# Patient Record
Sex: Female | Born: 1945
Health system: Southern US, Community
[De-identification: ages and names within clinical notes are randomized; demographics above are authoritative.]

## PROBLEM LIST (undated history)

## (undated) DIAGNOSIS — G43909 Migraine, unspecified, not intractable, without status migrainosus: Secondary | ICD-10-CM

## (undated) DIAGNOSIS — D219 Benign neoplasm of connective and other soft tissue, unspecified: Secondary | ICD-10-CM

## (undated) DIAGNOSIS — M8580 Other specified disorders of bone density and structure, unspecified site: Secondary | ICD-10-CM

## (undated) DIAGNOSIS — E039 Hypothyroidism, unspecified: Secondary | ICD-10-CM

## (undated) DIAGNOSIS — M1990 Unspecified osteoarthritis, unspecified site: Secondary | ICD-10-CM

## (undated) DIAGNOSIS — E785 Hyperlipidemia, unspecified: Secondary | ICD-10-CM

## (undated) DIAGNOSIS — J4520 Mild intermittent asthma, uncomplicated: Secondary | ICD-10-CM

## (undated) DIAGNOSIS — T7840XA Allergy, unspecified, initial encounter: Secondary | ICD-10-CM

## (undated) DIAGNOSIS — R011 Cardiac murmur, unspecified: Secondary | ICD-10-CM

## (undated) DIAGNOSIS — E559 Vitamin D deficiency, unspecified: Secondary | ICD-10-CM

## (undated) DIAGNOSIS — G5700 Lesion of sciatic nerve, unspecified lower limb: Secondary | ICD-10-CM

## (undated) HISTORY — DX: Mild intermittent asthma, uncomplicated: J45.20

## (undated) HISTORY — PX: OTHER SURGICAL HISTORY: SHX169

## (undated) HISTORY — DX: Benign neoplasm of connective and other soft tissue, unspecified: D21.9

## (undated) HISTORY — DX: Migraine, unspecified, not intractable, without status migrainosus: G43.909

## (undated) HISTORY — PX: DENTAL SURGERY: SHX609

## (undated) HISTORY — DX: Hyperlipidemia, unspecified: E78.5

## (undated) HISTORY — DX: Cardiac murmur, unspecified: R01.1

## (undated) HISTORY — DX: Lesion of sciatic nerve, unspecified lower limb: G57.00

## (undated) HISTORY — DX: Vitamin D deficiency, unspecified: E55.9

## (undated) HISTORY — PX: TUBAL LIGATION: SHX77

## (undated) HISTORY — DX: Hypothyroidism, unspecified: E03.9

## (undated) HISTORY — PX: CATARACT EXTRACTION: SUR2

## (undated) HISTORY — PX: POLYPECTOMY: SHX149

## (undated) HISTORY — DX: Allergy, unspecified, initial encounter: T78.40XA

## (undated) HISTORY — PX: SPINE SURGERY: SHX786

## (undated) HISTORY — DX: Unspecified osteoarthritis, unspecified site: M19.90

## (undated) HISTORY — PX: LUMBAR FUSION: SHX111

## (undated) HISTORY — DX: Other specified disorders of bone density and structure, unspecified site: M85.80

## (undated) HISTORY — PX: HYSTEROSCOPY WITH D & C: SHX1775

## (undated) HISTORY — PX: SINOSCOPY: SHX187

## (undated) HISTORY — PX: EYE SURGERY: SHX253

## (undated) HISTORY — PX: SEPTOPLASTY: SUR1290

---

## 1998-06-02 ENCOUNTER — Other Ambulatory Visit: Admission: RE | Admit: 1998-06-02 | Discharge: 1998-06-02 | Payer: Self-pay | Admitting: Obstetrics and Gynecology

## 1999-05-17 ENCOUNTER — Other Ambulatory Visit: Admission: RE | Admit: 1999-05-17 | Discharge: 1999-05-17 | Payer: Self-pay | Admitting: Obstetrics and Gynecology

## 1999-11-15 ENCOUNTER — Encounter: Payer: Self-pay | Admitting: Obstetrics and Gynecology

## 1999-11-15 ENCOUNTER — Encounter: Admission: RE | Admit: 1999-11-15 | Discharge: 1999-11-15 | Payer: Self-pay | Admitting: Obstetrics and Gynecology

## 2000-05-29 ENCOUNTER — Other Ambulatory Visit: Admission: RE | Admit: 2000-05-29 | Discharge: 2000-05-29 | Payer: Self-pay | Admitting: Obstetrics and Gynecology

## 2000-11-15 ENCOUNTER — Encounter: Payer: Self-pay | Admitting: Obstetrics and Gynecology

## 2000-11-15 ENCOUNTER — Encounter: Admission: RE | Admit: 2000-11-15 | Discharge: 2000-11-15 | Payer: Self-pay | Admitting: Obstetrics and Gynecology

## 2000-11-17 ENCOUNTER — Emergency Department (HOSPITAL_COMMUNITY): Admission: EM | Admit: 2000-11-17 | Discharge: 2000-11-17 | Payer: Self-pay | Admitting: Emergency Medicine

## 2000-12-13 ENCOUNTER — Other Ambulatory Visit: Admission: RE | Admit: 2000-12-13 | Discharge: 2000-12-13 | Payer: Self-pay | Admitting: Obstetrics and Gynecology

## 2000-12-13 ENCOUNTER — Encounter (INDEPENDENT_AMBULATORY_CARE_PROVIDER_SITE_OTHER): Payer: Self-pay | Admitting: Specialist

## 2001-05-28 ENCOUNTER — Other Ambulatory Visit: Admission: RE | Admit: 2001-05-28 | Discharge: 2001-05-28 | Payer: Self-pay | Admitting: Obstetrics and Gynecology

## 2001-11-20 ENCOUNTER — Encounter: Admission: RE | Admit: 2001-11-20 | Discharge: 2001-11-20 | Payer: Self-pay | Admitting: Obstetrics and Gynecology

## 2001-11-20 ENCOUNTER — Encounter: Payer: Self-pay | Admitting: Obstetrics and Gynecology

## 2001-11-22 ENCOUNTER — Encounter: Payer: Self-pay | Admitting: Obstetrics and Gynecology

## 2001-11-22 ENCOUNTER — Encounter: Admission: RE | Admit: 2001-11-22 | Discharge: 2001-11-22 | Payer: Self-pay | Admitting: Obstetrics and Gynecology

## 2002-05-10 ENCOUNTER — Encounter (INDEPENDENT_AMBULATORY_CARE_PROVIDER_SITE_OTHER): Payer: Self-pay | Admitting: Specialist

## 2002-05-10 ENCOUNTER — Ambulatory Visit (HOSPITAL_COMMUNITY): Admission: RE | Admit: 2002-05-10 | Discharge: 2002-05-10 | Payer: Self-pay | Admitting: Obstetrics and Gynecology

## 2002-06-10 ENCOUNTER — Other Ambulatory Visit: Admission: RE | Admit: 2002-06-10 | Discharge: 2002-06-10 | Payer: Self-pay | Admitting: Obstetrics and Gynecology

## 2002-11-26 ENCOUNTER — Encounter: Admission: RE | Admit: 2002-11-26 | Discharge: 2002-11-26 | Payer: Self-pay | Admitting: Obstetrics and Gynecology

## 2002-11-26 ENCOUNTER — Encounter: Payer: Self-pay | Admitting: Obstetrics and Gynecology

## 2003-07-18 ENCOUNTER — Other Ambulatory Visit: Admission: RE | Admit: 2003-07-18 | Discharge: 2003-07-18 | Payer: Self-pay | Admitting: Obstetrics and Gynecology

## 2003-12-24 ENCOUNTER — Encounter: Admission: RE | Admit: 2003-12-24 | Discharge: 2003-12-24 | Payer: Self-pay | Admitting: Obstetrics and Gynecology

## 2004-01-01 ENCOUNTER — Encounter: Admission: RE | Admit: 2004-01-01 | Discharge: 2004-01-01 | Payer: Self-pay | Admitting: Internal Medicine

## 2004-05-14 ENCOUNTER — Ambulatory Visit: Payer: Self-pay | Admitting: Internal Medicine

## 2004-07-20 ENCOUNTER — Other Ambulatory Visit: Admission: RE | Admit: 2004-07-20 | Discharge: 2004-07-20 | Payer: Self-pay | Admitting: *Deleted

## 2004-12-21 ENCOUNTER — Ambulatory Visit: Payer: Self-pay | Admitting: Internal Medicine

## 2004-12-24 ENCOUNTER — Encounter: Admission: RE | Admit: 2004-12-24 | Discharge: 2004-12-24 | Payer: Self-pay | Admitting: Obstetrics and Gynecology

## 2005-03-03 ENCOUNTER — Ambulatory Visit: Payer: Self-pay | Admitting: Internal Medicine

## 2005-04-22 ENCOUNTER — Ambulatory Visit: Payer: Self-pay | Admitting: Internal Medicine

## 2005-07-22 ENCOUNTER — Other Ambulatory Visit: Admission: RE | Admit: 2005-07-22 | Discharge: 2005-07-22 | Payer: Self-pay | Admitting: Obstetrics & Gynecology

## 2005-08-24 ENCOUNTER — Ambulatory Visit: Payer: Self-pay | Admitting: Internal Medicine

## 2005-12-19 ENCOUNTER — Ambulatory Visit: Payer: Self-pay | Admitting: Internal Medicine

## 2005-12-26 ENCOUNTER — Encounter: Admission: RE | Admit: 2005-12-26 | Discharge: 2005-12-26 | Payer: Self-pay | Admitting: Internal Medicine

## 2006-01-03 ENCOUNTER — Ambulatory Visit: Payer: Self-pay | Admitting: Internal Medicine

## 2006-03-30 ENCOUNTER — Ambulatory Visit: Payer: Self-pay | Admitting: Internal Medicine

## 2006-05-18 ENCOUNTER — Ambulatory Visit: Payer: Self-pay | Admitting: Internal Medicine

## 2006-06-14 ENCOUNTER — Ambulatory Visit: Payer: Self-pay | Admitting: Internal Medicine

## 2006-06-30 ENCOUNTER — Ambulatory Visit: Payer: Self-pay | Admitting: Internal Medicine

## 2006-09-28 DIAGNOSIS — M8580 Other specified disorders of bone density and structure, unspecified site: Secondary | ICD-10-CM | POA: Insufficient documentation

## 2006-09-28 DIAGNOSIS — M810 Age-related osteoporosis without current pathological fracture: Secondary | ICD-10-CM | POA: Insufficient documentation

## 2006-09-28 DIAGNOSIS — M545 Low back pain: Secondary | ICD-10-CM | POA: Insufficient documentation

## 2006-09-28 DIAGNOSIS — K589 Irritable bowel syndrome without diarrhea: Secondary | ICD-10-CM | POA: Insufficient documentation

## 2006-10-06 ENCOUNTER — Other Ambulatory Visit: Admission: RE | Admit: 2006-10-06 | Discharge: 2006-10-06 | Payer: Self-pay | Admitting: Obstetrics & Gynecology

## 2006-12-28 ENCOUNTER — Encounter: Payer: Self-pay | Admitting: Internal Medicine

## 2006-12-28 ENCOUNTER — Encounter: Admission: RE | Admit: 2006-12-28 | Discharge: 2006-12-28 | Payer: Self-pay | Admitting: Internal Medicine

## 2007-01-22 ENCOUNTER — Ambulatory Visit: Payer: Self-pay | Admitting: Internal Medicine

## 2007-01-22 DIAGNOSIS — N318 Other neuromuscular dysfunction of bladder: Secondary | ICD-10-CM

## 2007-01-22 DIAGNOSIS — E039 Hypothyroidism, unspecified: Secondary | ICD-10-CM

## 2007-01-22 DIAGNOSIS — E785 Hyperlipidemia, unspecified: Secondary | ICD-10-CM

## 2007-01-24 ENCOUNTER — Encounter: Payer: Self-pay | Admitting: Internal Medicine

## 2007-01-26 ENCOUNTER — Encounter (INDEPENDENT_AMBULATORY_CARE_PROVIDER_SITE_OTHER): Payer: Self-pay | Admitting: *Deleted

## 2007-01-26 LAB — CONVERTED CEMR LAB
ALT: 17 units/L (ref 0–35)
Albumin: 4.2 g/dL (ref 3.5–5.2)
Basophils Relative: 0.4 % (ref 0.0–1.0)
Bilirubin, Direct: 0.1 mg/dL (ref 0.0–0.3)
CO2: 32 meq/L (ref 19–32)
Calcium: 8.9 mg/dL (ref 8.4–10.5)
Creatinine,U: 31.5 mg/dL
Eosinophils Absolute: 0.2 10*3/uL (ref 0.0–0.6)
GFR calc non Af Amer: 78 mL/min
HDL: 56.6 mg/dL (ref >39.0)
LDL Cholesterol: 106 mg/dL — ABNORMAL HIGH (ref 0–99)
MCHC: 33.8 g/dL (ref 30.0–36.0)
Microalb, Ur: 0.2 mg/dL (ref 0.0–1.9)
Monocytes Absolute: 0.5 10*3/uL (ref 0.2–0.7)
Monocytes Relative: 5.9 % (ref 3.0–11.0)
Neutrophils Relative %: 68.2 % (ref 43.0–77.0)
Platelets: 435 10*3/uL — ABNORMAL HIGH (ref 150–400)
Potassium: 3.8 meq/L (ref 3.5–5.1)
RDW: 13.4 % (ref 11.5–14.6)
Sodium: 139 meq/L (ref 135–145)
Triglycerides: 114 mg/dL (ref 0–149)
VLDL: 23 mg/dL (ref 0–40)

## 2007-01-31 ENCOUNTER — Encounter: Admission: RE | Admit: 2007-01-31 | Discharge: 2007-01-31 | Payer: Self-pay | Admitting: Internal Medicine

## 2007-01-31 ENCOUNTER — Encounter: Payer: Self-pay | Admitting: Internal Medicine

## 2007-02-27 ENCOUNTER — Encounter: Payer: Self-pay | Admitting: Internal Medicine

## 2007-04-18 ENCOUNTER — Ambulatory Visit: Payer: Self-pay | Admitting: Internal Medicine

## 2007-05-15 ENCOUNTER — Telehealth: Payer: Self-pay | Admitting: Internal Medicine

## 2007-06-11 ENCOUNTER — Telehealth (INDEPENDENT_AMBULATORY_CARE_PROVIDER_SITE_OTHER): Payer: Self-pay | Admitting: *Deleted

## 2007-06-14 ENCOUNTER — Encounter: Payer: Self-pay | Admitting: Internal Medicine

## 2007-09-04 ENCOUNTER — Ambulatory Visit: Payer: Self-pay | Admitting: Internal Medicine

## 2007-09-24 ENCOUNTER — Ambulatory Visit: Payer: Self-pay | Admitting: Internal Medicine

## 2007-12-05 ENCOUNTER — Ambulatory Visit: Payer: Self-pay | Admitting: Internal Medicine

## 2007-12-05 DIAGNOSIS — M1990 Unspecified osteoarthritis, unspecified site: Secondary | ICD-10-CM | POA: Insufficient documentation

## 2007-12-05 DIAGNOSIS — R351 Nocturia: Secondary | ICD-10-CM

## 2007-12-10 ENCOUNTER — Other Ambulatory Visit: Admission: RE | Admit: 2007-12-10 | Discharge: 2007-12-10 | Payer: Self-pay | Admitting: Obstetrics & Gynecology

## 2007-12-17 LAB — CONVERTED CEMR LAB
Albumin: 3.9 g/dL (ref 3.5–5.2)
BUN: 9 mg/dL (ref 6–23)
Basophils Relative: 0.1 % (ref 0.0–1.0)
Calcium: 8.7 mg/dL (ref 8.4–10.5)
Cholesterol: 235 mg/dL (ref 0–200)
Eosinophils Absolute: 0.3 10*3/uL (ref 0.0–0.7)
Eosinophils Relative: 3.9 % (ref 0.0–5.0)
GFR calc non Af Amer: 67 mL/min
HDL: 46.6 mg/dL (ref >39.0)
Lymphocytes Relative: 27.9 % (ref 12.0–46.0)
Monocytes Absolute: 0.5 10*3/uL (ref 0.1–1.0)
Neutro Abs: 4.4 10*3/uL (ref 1.4–7.7)
Neutrophils Relative %: 61.6 % (ref 43.0–77.0)
Platelets: 399 10*3/uL (ref 150–400)
RBC: 4.33 M/uL (ref 3.87–5.11)
Sodium: 135 meq/L (ref 135–145)
VLDL: 24 mg/dL (ref 0–40)

## 2007-12-31 ENCOUNTER — Encounter: Admission: RE | Admit: 2007-12-31 | Discharge: 2007-12-31 | Payer: Self-pay | Admitting: Obstetrics & Gynecology

## 2008-01-14 ENCOUNTER — Ambulatory Visit: Payer: Self-pay | Admitting: Internal Medicine

## 2008-01-14 ENCOUNTER — Encounter (INDEPENDENT_AMBULATORY_CARE_PROVIDER_SITE_OTHER): Payer: Self-pay | Admitting: *Deleted

## 2008-01-14 LAB — CONVERTED CEMR LAB: OCCULT 3: NEGATIVE

## 2008-01-15 ENCOUNTER — Telehealth (INDEPENDENT_AMBULATORY_CARE_PROVIDER_SITE_OTHER): Payer: Self-pay | Admitting: *Deleted

## 2008-02-20 ENCOUNTER — Telehealth (INDEPENDENT_AMBULATORY_CARE_PROVIDER_SITE_OTHER): Payer: Self-pay | Admitting: *Deleted

## 2008-02-22 ENCOUNTER — Ambulatory Visit: Payer: Self-pay | Admitting: Internal Medicine

## 2008-02-26 ENCOUNTER — Telehealth (INDEPENDENT_AMBULATORY_CARE_PROVIDER_SITE_OTHER): Payer: Self-pay | Admitting: *Deleted

## 2008-02-27 LAB — CONVERTED CEMR LAB
Cholesterol: 176 mg/dL (ref 0–200)
HDL: 31.6 mg/dL — ABNORMAL LOW (ref >39.0)
TSH: 9.18 microintl units/mL — ABNORMAL HIGH (ref 0.35–5.50)
Triglycerides: 157 mg/dL — ABNORMAL HIGH (ref 0–149)
VLDL: 31 mg/dL (ref 0–40)

## 2008-04-23 ENCOUNTER — Ambulatory Visit: Payer: Self-pay | Admitting: Internal Medicine

## 2008-04-28 ENCOUNTER — Encounter (INDEPENDENT_AMBULATORY_CARE_PROVIDER_SITE_OTHER): Payer: Self-pay | Admitting: *Deleted

## 2008-04-28 LAB — CONVERTED CEMR LAB: TSH: 1.41 microintl units/mL (ref 0.35–5.50)

## 2008-07-03 ENCOUNTER — Other Ambulatory Visit: Admission: RE | Admit: 2008-07-03 | Discharge: 2008-07-03 | Payer: Self-pay | Admitting: Obstetrics & Gynecology

## 2008-07-08 ENCOUNTER — Ambulatory Visit: Payer: Self-pay | Admitting: Internal Medicine

## 2008-07-08 DIAGNOSIS — K219 Gastro-esophageal reflux disease without esophagitis: Secondary | ICD-10-CM | POA: Insufficient documentation

## 2008-07-09 ENCOUNTER — Encounter: Payer: Self-pay | Admitting: Internal Medicine

## 2008-07-18 ENCOUNTER — Ambulatory Visit: Payer: Self-pay | Admitting: Internal Medicine

## 2008-07-20 LAB — CONVERTED CEMR LAB
ALT: 11 units/L (ref 0–35)
AST: 19 units/L (ref 0–37)
Albumin: 3.8 g/dL (ref 3.5–5.2)
Alkaline Phosphatase: 56 units/L (ref 39–117)
Bilirubin, Direct: 0.1 mg/dL (ref 0.0–0.3)
Cholesterol: 161 mg/dL (ref 0–200)
HDL: 44.8 mg/dL (ref >39.0)
LDL Cholesterol: 88 mg/dL (ref 0–99)
TSH: 2.41 microintl units/mL (ref 0.35–5.50)
Total Bilirubin: 0.9 mg/dL (ref 0.3–1.2)
Total CHOL/HDL Ratio: 3.6
Total Protein: 7.1 g/dL (ref 6.0–8.3)
Triglycerides: 139 mg/dL (ref 0–149)
VLDL: 28 mg/dL (ref 0–40)

## 2008-07-21 ENCOUNTER — Encounter (INDEPENDENT_AMBULATORY_CARE_PROVIDER_SITE_OTHER): Payer: Self-pay | Admitting: *Deleted

## 2008-08-18 ENCOUNTER — Telehealth: Payer: Self-pay | Admitting: Internal Medicine

## 2008-09-02 ENCOUNTER — Telehealth (INDEPENDENT_AMBULATORY_CARE_PROVIDER_SITE_OTHER): Payer: Self-pay | Admitting: *Deleted

## 2008-12-31 ENCOUNTER — Encounter: Admission: RE | Admit: 2008-12-31 | Discharge: 2008-12-31 | Payer: Self-pay | Admitting: Obstetrics & Gynecology

## 2009-03-11 ENCOUNTER — Telehealth (INDEPENDENT_AMBULATORY_CARE_PROVIDER_SITE_OTHER): Payer: Self-pay | Admitting: *Deleted

## 2009-03-11 ENCOUNTER — Encounter (INDEPENDENT_AMBULATORY_CARE_PROVIDER_SITE_OTHER): Payer: Self-pay | Admitting: *Deleted

## 2009-03-13 ENCOUNTER — Telehealth (INDEPENDENT_AMBULATORY_CARE_PROVIDER_SITE_OTHER): Payer: Self-pay | Admitting: *Deleted

## 2009-03-18 ENCOUNTER — Ambulatory Visit: Payer: Self-pay | Admitting: Internal Medicine

## 2009-03-18 LAB — CONVERTED CEMR LAB: TSH: 0.55 microintl units/mL (ref 0.35–5.50)

## 2009-03-19 ENCOUNTER — Ambulatory Visit: Payer: Self-pay | Admitting: Internal Medicine

## 2009-03-19 ENCOUNTER — Encounter (INDEPENDENT_AMBULATORY_CARE_PROVIDER_SITE_OTHER): Payer: Self-pay | Admitting: *Deleted

## 2009-03-19 DIAGNOSIS — E559 Vitamin D deficiency, unspecified: Secondary | ICD-10-CM

## 2009-03-19 LAB — CONVERTED CEMR LAB: Vit D, 25-Hydroxy: 29 ng/mL — ABNORMAL LOW (ref 30–89)

## 2009-03-26 ENCOUNTER — Encounter: Payer: Self-pay | Admitting: Internal Medicine

## 2009-03-26 ENCOUNTER — Encounter: Admission: RE | Admit: 2009-03-26 | Discharge: 2009-03-26 | Payer: Self-pay | Admitting: Internal Medicine

## 2009-03-30 ENCOUNTER — Encounter (INDEPENDENT_AMBULATORY_CARE_PROVIDER_SITE_OTHER): Payer: Self-pay | Admitting: *Deleted

## 2009-04-08 ENCOUNTER — Ambulatory Visit: Payer: Self-pay | Admitting: Internal Medicine

## 2009-06-25 ENCOUNTER — Ambulatory Visit: Payer: Self-pay | Admitting: Family

## 2009-06-29 ENCOUNTER — Encounter: Payer: Self-pay | Admitting: Internal Medicine

## 2009-07-10 ENCOUNTER — Encounter: Payer: Self-pay | Admitting: Internal Medicine

## 2009-09-17 ENCOUNTER — Encounter: Payer: Self-pay | Admitting: Internal Medicine

## 2009-10-09 ENCOUNTER — Ambulatory Visit: Payer: Self-pay | Admitting: Internal Medicine

## 2009-10-09 LAB — CONVERTED CEMR LAB
ALT: 16 units/L (ref 0–35)
AST: 22 units/L (ref 0–37)
Basophils Absolute: 0.1 10*3/uL (ref 0.0–0.1)
Basophils Relative: 0.8 % (ref 0.0–3.0)
Bilirubin Urine: NEGATIVE
Chloride: 103 meq/L (ref 96–112)
Cholesterol: 188 mg/dL (ref 0–200)
Eosinophils Absolute: 0.2 10*3/uL (ref 0.0–0.7)
Eosinophils Relative: 2.5 % (ref 0.0–5.0)
Hemoglobin: 14.3 g/dL (ref 12.0–15.0)
Ketones, urine, test strip: NEGATIVE
Lymphs Abs: 2.3 10*3/uL (ref 0.7–4.0)
MCHC: 34.2 g/dL (ref 30.0–36.0)
Monocytes Relative: 8.2 % (ref 3.0–12.0)
Neutro Abs: 4.9 10*3/uL (ref 1.4–7.7)
Neutrophils Relative %: 60.6 % (ref 43.0–77.0)
Platelets: 416 10*3/uL — ABNORMAL HIGH (ref 150.0–400.0)
RDW: 13 % (ref 11.5–14.6)
TSH: 0.93 microintl units/mL (ref 0.35–5.50)
Total CHOL/HDL Ratio: 3
Triglycerides: 132 mg/dL (ref 0.0–149.0)

## 2009-10-16 ENCOUNTER — Ambulatory Visit: Payer: Self-pay | Admitting: Internal Medicine

## 2009-10-16 DIAGNOSIS — J309 Allergic rhinitis, unspecified: Secondary | ICD-10-CM | POA: Insufficient documentation

## 2009-10-16 DIAGNOSIS — R209 Unspecified disturbances of skin sensation: Secondary | ICD-10-CM

## 2010-01-01 ENCOUNTER — Encounter: Admission: RE | Admit: 2010-01-01 | Discharge: 2010-01-01 | Payer: Self-pay | Admitting: Obstetrics & Gynecology

## 2010-01-01 LAB — HM MAMMOGRAPHY: HM Mammogram: NORMAL

## 2010-03-26 ENCOUNTER — Ambulatory Visit: Payer: Self-pay | Admitting: Internal Medicine

## 2010-04-05 ENCOUNTER — Ambulatory Visit: Payer: Self-pay | Admitting: Internal Medicine

## 2010-04-05 DIAGNOSIS — G43009 Migraine without aura, not intractable, without status migrainosus: Secondary | ICD-10-CM | POA: Insufficient documentation

## 2010-04-05 DIAGNOSIS — H9209 Otalgia, unspecified ear: Secondary | ICD-10-CM | POA: Insufficient documentation

## 2010-08-03 NOTE — Letter (Signed)
Summary: Letter Regarding Disease Mgmt Program/[facility]  Letter Regarding Disease Mgmt Program/[facility]   Imported By: Lanelle Bal 09/22/2009 13:00:17  _____________________________________________________________________  External Attachment:    Type:   Image     Comment:   External Document

## 2010-08-03 NOTE — Assessment & Plan Note (Signed)
Summary: FLU SHOT///SPH  Nurse Visit   Allergies: No Known Drug Allergies  Orders Added: 1)  Admin 1st Vaccine [90471] 2)  Flu Vaccine 73yrs + [16109] Flu Vaccine Consent Questions     Do you have a history of severe allergic reactions to this vaccine? no    Any prior history of allergic reactions to egg and/or gelatin? no    Do you have a sensitivity to the preservative Thimersol? no    Do you have a past history of Guillan-Barre Syndrome? no    Do you currently have an acute febrile illness? no    Have you ever had a severe reaction to latex? no    Vaccine information given and explained to patient? yes    Are you currently pregnant? no    Lot Number:[id number]   Exp Date:01/01/2011   Site Given  right  Deltoid IM

## 2010-08-03 NOTE — Assessment & Plan Note (Signed)
Summary: sore in mouth/tingling cheek/cbs   Vital Signs:  Patient profile:   65 year old female Weight:      156 pounds BMI:     27.30 Temp:     98.3 degrees F oral Pulse rate:   64 / minute Resp:     15 per minute BP sitting:   128 / 72  (left arm) Cuff size:   large  Vitals Entered By: Shonna Chock CMA (April 05, 2010 4:18 PM) CC: Mouth/ cheek concerns (History of mouth ulcers), Ear pain   CC:  Mouth/ cheek concerns (History of mouth ulcers) and Ear pain.  History of Present Illness:   Intermittent tingling L upper face since 04/02/2010; PMH of similar symptoms Spring 2011 for 1 -2 weeks.Rx: none. She had a headache in temples several days prior to onset of tingling.PMH of migraines , as needed Fioricet from Dr Leda Quail, Gyn.She had been @ the [employer] increased amounts of shellfish   She also  presents with  R ear pain since 09/30.  The patient also reports sensation of fullness, progression of chronic  hearing loss , sinus pressure & congestion, and clear nasal discharge, but denies ear discharge, tinnitus, and fever.  The "sore"  pain is located behind the right ear.  The pain is described as intermittent and worse @ night in [place].  The patient denies headache, toothache, dizziness, and vertigo.  Patient's history includes hay fever/allergies.  Rx: Tylenol Sinus with decreased congestion. She noted R posterior oral ulcers. Rx: gargling with saline & topical throat medication.   Current Medications (verified): 1)  Levothroid 112 Mcg  Tabs (Levothyroxine Sodium) .Marland Kitchen.. 1 By Mouth Once Daily Except 1& 1/2 On Weds 2)  Vivelle 0.05 Mg/24hr Pttw (Estradiol) .... One Patch Two Time Weekly 3)  Multivitamin 4)  Vit C 1000mg  5)  Folic Acid 6)  Pravachol 20 Mg  Tabs (Pravastatin Sodium) .... One Daily 7)  Tums 500 Mg Chew (Calcium Carbonate Antacid) .Marland Kitchen.. 1 By Mouth As Needed 8)  Lorazepam 0.5 Mg Tabs (Lorazepam) .Marland Kitchen.. 1 Q 8-12 Hrs As Needed Stress 9)  Prometrium 200 Mg Caps  (Progesterone Micronized) .Marland Kitchen.. 1 By Mouth Once Daily 10)  Vitamin E 400 Unit Caps (Vitamin E) .Marland Kitchen.. 1 By Mouth Once Daily 11)  Vitamin D3 1000 Unit Caps (Cholecalciferol) .Marland Kitchen.. 1 By Mouth Once Daily 12)  Fioricet 50-325-40 Mg Tabs (Butalbital-Apap-Caffeine) .... As Needed 13)  Temazepam 15 Mg Caps (Temazepam) .... As Needed  Allergies (verified): No Known Drug Allergies  Review of Systems ENT:  Complains of hoarseness; denies difficulty swallowing. Derm:  Denies lesion(s) and rash. Neuro:  Denies brief paralysis, numbness, and weakness.  Physical Exam  General:  in no acute distress; alert,appropriate and cooperative throughout examination Eyes:  No corneal or conjunctival inflammation noted. EOMI. Perrla. FOV ;  Vision  decreased OS due to lens for near vision. Slight ptosis  Ears:  External ear exam shows no significant lesions or deformities.  Otoscopic examination reveals clear canals, tympanic membranes are intact bilaterally without bulging, retraction, inflammation or discharge. Hearing aids  bilaterally.  Nose:  External nasal examination shows no deformity or inflammation. Nasal mucosa are pink and moist without lesions or exudates. Mouth:  Oral mucosa and oropharynx without lesions or exudates.  Teeth in good repair. Minimal erythema oropharynx Heart:  Normal rate and regular rhythm. S1 and S2 normal without gallop, murmur, click, rub .S4 Pulses:  R and L carotid,radial  pulses are full and  equal bilaterally Neurologic:  alert & oriented X3, cranial nerves II-XII intact, strength normal in all extremities, sensation intact to light touch, gait normal, DTRs symmetrical and normal, finger-to-nose normal, and Romberg negative.   Skin:  Intact without suspicious lesions or rashes Cervical Nodes:  No lymphadenopathy noted Axillary Nodes:  No palpable lymphadenopathy Psych:  memory intact for recent and remote, normally interactive, and good eye contact.     Impression &  Recommendations:  Problem # 1:  FACIAL PARESTHESIA, LEFT (ICD-782.0) probably  migraine related; minimal residual ptosis OS  Problem # 2:  EAR PAIN, REFERRED (ICD-388.72) probably related to aphthous ulcers  Problem # 3:  MIGRAINE, COMMON (ICD-346.10) R/O neurologic aura Her updated medication list for this problem includes:    Fioricet 50-325-40 Mg Tabs (Butalbital-apap-caffeine) .Marland Kitchen... As needed  Complete Medication List: 1)  Levothroid 112 Mcg Tabs (Levothyroxine sodium) .Marland Kitchen.. 1 by mouth once daily except 1& 1/2 on weds 2)  Vivelle 0.05 Mg/24hr Pttw (Estradiol) .... One patch two time weekly 3)  Multivitamin  4)  Vit C 1000mg   5)  Folic Acid  6)  Pravachol 20 Mg Tabs (Pravastatin sodium) .... One daily 7)  Tums 500 Mg Chew (Calcium carbonate antacid) .Marland Kitchen.. 1 by mouth as needed 8)  Lorazepam 0.5 Mg Tabs (Lorazepam) .Marland Kitchen.. 1 q 8-12 hrs as needed stress 9)  Prometrium 200 Mg Caps (Progesterone micronized) .Marland Kitchen.. 1 by mouth once daily 10)  Vitamin E 400 Unit Caps (Vitamin e) .Marland Kitchen.. 1 by mouth once daily 11)  Vitamin D3 1000 Unit Caps (Cholecalciferol) .Marland Kitchen.. 1 by mouth once daily 12)  Fioricet 50-325-40 Mg Tabs (Butalbital-apap-caffeine) .... As needed 13)  Temazepam 15 Mg Caps (Temazepam) .... As needed  Patient Instructions: 1)  Keep a Headache Diary as discussed.  Fill Rx for Doxycycline if having facial pain, pus or fever.

## 2010-08-03 NOTE — Consult Note (Signed)
Summary: [facility]  [facility]   Imported By: Lanelle Bal 07/21/2009 12:17:59  _____________________________________________________________________  External Attachment:    Type:   Image     Comment:   External Document

## 2010-08-03 NOTE — Consult Note (Signed)
Summary: [facility]  [facility]   Imported By: Lanelle Bal 07/06/2009 09:10:36  _____________________________________________________________________  External Attachment:    Type:   Image     Comment:   External Document

## 2010-08-03 NOTE — Assessment & Plan Note (Signed)
Summary: CPX,LABS PRIOR,BCBS/RH......   Vital Signs:  Patient profile:   65 year old female Height:      63.5 inches Weight:      157.2 pounds BMI:     27.51 Temp:     98.9 degrees F oral Pulse rate:   70 / minute Resp:     16 per minute BP sitting:   124 / 78  (left arm) Cuff size:   regular  Vitals Entered By: Shonna Chock (October 16, 2009 1:17 PM)  Comments REVIEWED MED LIST, PATIENT AGREED DOSE AND INSTRUCTION CORRECT    History of Present Illness: Pamela Rich is here for a physical; she had L facial numbness for seconds intermittently for 3 days 2 weeks ago.  Preventive Screening-Counseling & Management  Alcohol-Tobacco     Smoking Status: quit  Caffeine-Diet-Exercise     Does Patient Exercise: yes  Allergies (verified): No Known Drug Allergies  Past History:  Past Medical History: Hyperlipidemia: LDL goal = < 125, based on NMR Lipoprofile  Hypothyroidism Low back pain Osteopenia; DJD   Past Surgical History: Septoplasty; uterine polypectomy, Dr Larita Fife Smith;colonoscopy revealed "kink", Dr Victorino Dike ;  Tubal ligation; G 4 P 3 Lumbar fusion  Family History: Father: DM,CVA Mother:HTN,CHF, osteoporosis, MI  in 46s,AS (Dr Turner,Cardiology)  Siblings: bro :cns cancer  Social History: no diet Retired Former Smoker: quit age 73 Alcohol use-no Regular exercise-yes: 2-3X /week  Review of Systems       The patient complains of decreased hearing.  The patient denies anorexia, fever, weight loss, weight gain, abdominal pain, melena, hematochezia, severe indigestion/heartburn, suspicious skin lesions, depression, unusual weight change, abnormal bleeding, enlarged lymph nodes, and angioedema.   Eyes:  Denies blurring, double vision, and vision loss-both eyes. ENT:  Denies difficulty swallowing and hoarseness. CV:  Denies chest pain or discomfort, leg cramps with exertion, lightheadness, near fainting, palpitations, shortness of breath with exertion, swelling of  feet, and swelling of hands. Resp:  Denies cough, shortness of breath, sputum productive, and wheezing. GU:  Denies incontinence. Neuro:  Denies brief paralysis, disturbances in coordination, headaches, inability to speak, memory loss, poor balance, sensation of room spinning, tremors, visual disturbances, and weakness. Allergy:  Complains of itching eyes, seasonal allergies, and sneezing; Rx: Sudafed.  Physical Exam  General:  well-nourished,in no acute distress; alert,appropriate and cooperative throughout examination Head:  Normocephalic and atraumatic without obvious abnormalities.  Eyes:  No corneal or conjunctival inflammation noted. EOMI. Perrla. Funduscopic exam benign, without hemorrhages, exudates or papilledema. Vision  & FOV grossly normal. Ptosis OS Ears:  External ear exam shows no significant lesions or deformities.  Otoscopic examination reveals clear canals, tympanic membranes are intact bilaterally without bulging, retraction, inflammation or discharge. Hearing  aids bilaterally Nose:  External nasal examination shows no deformity or inflammation. Nasal mucosa are pink and moist without lesions or exudates. Mouth:  Oral mucosa and oropharynx without lesions or exudates.  Teeth in good repair. Neck:  No deformities, masses, or tenderness noted. Lungs:  Normal respiratory effort, chest expands symmetrically. Lungs are clear to auscultation, no crackles or wheezes. Heart:  Normal rate and regular rhythm. S1 and S2 normal without gallop, murmur, click, rub or other extra sounds. Abdomen:  Bowel sounds positive,abdomen soft and non-tender without masses, organomegaly or hernias noted. Genitalia:  Dr Leda Quail Msk:  No deformity or scoliosis noted of thoracic or lumbar spine.   Pulses:  R and L carotid,radial,dorsalis pedis and posterior tibial pulses are full and equal bilaterally Extremities:  No clubbing, cyanosis, edema, or deformity noted with normal full range of motion of  all joints.   Neurologic:  alert & oriented X3, cranial nerves II-XII intact, strength normal in all extremities, sensation intact to light touch, gait normal, DTRs symmetrical and normal, finger-to-nose normal, heel-to-shin normal, and Romberg negative.   No pronator drift Skin:  Intact without suspicious lesions or rashes Cervical Nodes:  No lymphadenopathy noted Axillary Nodes:  No palpable lymphadenopathy Psych:  memory intact for recent and remote, normally interactive, and good eye contact.     Impression & Recommendations:  Problem # 1:  ROUTINE GENERAL MEDICAL EXAM@HEALTH  CARE FACL (ICD-V70.0)  Orders: EKG w/ Interpretation (93000)  Problem # 2:  FACIAL PARESTHESIA, LEFT (ICD-782.0)  Problem # 3:  RHINITIS (ICD-477.9)  Problem # 4:  HYPERLIPIDEMIA NEC/NOS (ICD-272.4)  Her updated medication list for this problem includes:    Pravachol 20 Mg Tabs (Pravastatin sodium) ..... One daily  Orders: EKG w/ Interpretation (93000)  Problem # 5:  HYPOTHYROIDISM (ICD-244.9) corrected Her updated medication list for this problem includes:    Levothroid 112 Mcg Tabs (Levothyroxine sodium) .Marland Kitchen... 1 by mouth once daily except 1& 1/2 on weds  Problem # 6:  OSTEOPENIA (ICD-733.90)  Problem # 7:  VITAMIN D DEFICIENCY (ICD-268.9)  Orders: Venipuncture (01751) T-Vitamin D (25-Hydroxy) (02585-27782)  Complete Medication List: 1)  Levothroid 112 Mcg Tabs (Levothyroxine sodium) .Marland Kitchen.. 1 by mouth once daily except 1& 1/2 on weds 2)  Vivelle 0.05 Mg/24hr Pttw (Estradiol) .... One patch two time weekly 3)  Multivitamin  4)  Vit C 1000mg   5)  Folic Acid  6)  Pravachol 20 Mg Tabs (Pravastatin sodium) .... One daily 7)  Zantac 150 Maximum Strength 150 Mg Tabs (Ranitidine hcl) .Marland Kitchen.. 1 q 12 hrs pre meal as needed 8)  Lorazepam 0.5 Mg Tabs (Lorazepam) .Marland Kitchen.. 1 q 8-12 hrs as needed stress 9)  Prometrium 200 Mg Caps (Progesterone micronized) .Marland Kitchen.. 1 by mouth once daily 10)  Vitamin E 400 Unit  Caps (Vitamin e) .Marland Kitchen.. 1 by mouth once daily 11)  Vitamin D 50,000 Unit Tabs (cholecalciferol)  .Marland Kitchen.. 1 pill every other week 12)  Fioricet 50-325-40 Mg Tabs (Butalbital-apap-caffeine) .... As needed 13)  Temazepam 15 Mg Caps (Temazepam) .... As needed  Patient Instructions: 1)  Take an 81 mg coated  Aspirin every day. 2)  Avoid foods high in acid (tomatoes, citrus juices, spicy foods). Avoid eating within two hours of lying down or before exercising. Do not over eat; try smaller more frequent meals. Elevate head of bed twelve inches when sleeping. Prescriptions: PRAVACHOL 20 MG  TABS (PRAVASTATIN SODIUM) one daily  #90 Each x 3   Entered and Authorized by:   Marga Melnick MD   Signed by:   Marga Melnick MD on 10/16/2009   Method used:   Print then Give to Patient   RxID:   [id number] LEVOTHROID 112 MCG  TABS (LEVOTHYROXINE SODIUM) 1 by mouth once daily EXCEPT 1& 1/2 on Weds  #96 x 3   Entered and Authorized by:   Marga Melnick MD   Signed by:   Marga Melnick MD on 10/16/2009   Method used:   Print then Give to Patient   RxID:   [id number]

## 2010-10-18 ENCOUNTER — Other Ambulatory Visit: Payer: Self-pay | Admitting: Internal Medicine

## 2010-10-18 NOTE — Telephone Encounter (Signed)
Patient needs to schedule a CPX-30 min appointment and have labs also.

## 2010-10-22 ENCOUNTER — Other Ambulatory Visit: Payer: Self-pay | Admitting: *Deleted

## 2010-10-22 MED ORDER — LORAZEPAM 0.5 MG PO TABS: 0.5000 mg | ORAL_TABLET | Freq: Three times a day (TID) | ORAL | Status: AC

## 2010-11-05 ENCOUNTER — Other Ambulatory Visit: Payer: Self-pay | Admitting: Obstetrics & Gynecology

## 2010-11-05 DIAGNOSIS — Z1231 Encounter for screening mammogram for malignant neoplasm of breast: Secondary | ICD-10-CM

## 2010-11-19 NOTE — Op Note (Signed)
NAME:  Pamela Rich, Pamela Rich                         ACCOUNT NO.:  [id number]   MEDICAL RECORD NO.:  [id number]                   PATIENT TYPE:  AMB   LOCATION:  SDC                                  FACILITY:  [facility]   PHYSICIAN:  Laqueta Linden, M.D.                 DATE OF BIRTH:  1945/10/29   DATE OF PROCEDURE:  05/10/2002  DATE OF DISCHARGE:                                 OPERATIVE REPORT   PREOPERATIVE DIAGNOSIS:  Postmenopausal bleeding due to endometrial polyp.   POSTOPERATIVE DIAGNOSIS:  Postmenopausal bleeding due to endometrial polyp.   PROCEDURE:  Hysteroscopic resection with curettage.   SURGEON:  Laqueta Linden, M.D.   ANESTHESIA:  General mask.   ESTIMATED BLOOD LOSS:  Less than 20 cc.   SORBITOL INTAKE:  Less than 100 cc.   COMPLICATIONS:  None.   INDICATIONS:  The patient is a 65 year old menopausal female with  postmenopausal bleeding, noted on ultrasound to have two small intramural  fibroids, with sonohysterogram revealing a 1.9 x 0.6 x 1.1 cm polyp in the  endometrial cavity.  She was therefore to undergo outpatient hysteroscopic  resection.  She has seen the informed consent film, voiced her understanding  of risks, benefits, alternatives, and complications, and agrees to proceed.  Full consent has been given.   DESCRIPTION OF PROCEDURE:  The patient was taken to the operating room and  after proper identification and consents were ascertained, she was placed on  the operating table in the supine position.  After the induction of general  mask anesthesia, she was placed in the [place] stirrups and the perineum and  vagina were prepped and draped in the routine sterile fashion.  A  transurethral Foley was placed, which was removed at the conclusion of the  procedure.  The uterus was noted on bimanual to be retroverted and normal  size.  A speculum was placed in the vagina.  The anterior lip of the cervix  was grasped with a single-tooth tenaculum.  The uterine  cavity sounded to  8.5 cm.  The internal os was gently dilated to a #33 Pratt dilator.  The  resectoscope with continuous sorbitol infusion was then inserted under  direct vision.  The endocervical canal was free of lesions.  Just inside the  lower uterine segment on the right posterior wall of the uterus was a broad-  based polyp.  Both cornua were visualized with no additional lesions  identified.  The remainder of the endometrial cavity appeared atrophic.  The  resectoscope was placed on routine settings, and the polyp was then resected  in a systematic fashion.  There was noted to be a small polyp just adjacent  to it, which was visualized after the larger polyp was removed, and this was  resected as well.  The polyp specimen was sent separately to pathology.  The  base was hemostatic.  The scope was  then withdrawn.  Sharp curettage  productive of a minimal amount of additional tissue was then obtained.  All  instruments were then removed.  The tenaculum site was hemostatic.  There  was no active bleeding from the cervix.  Estimated blood loss was less than  20 cc.  Sorbitol intake less than 100 cc.  The patient was awakened and  stable on transfer to the recovery room.  She had received 30 mg of Toradol  IV, 30 mg IM prior to awakening.  She will be observed and  discharged per anesthesia protocol.  She is to take Advil or Aleve and  continue all of her routine medications and to follow up in the office in  four to six weeks' time.  She is call sooner for excessive pain, fever,  bleeding, or other concerns.                                               Laqueta Linden, M.D.    LKS/MEDQ  D:  05/10/2002  T:  05/10/2002  Job:  [id number]

## 2010-11-22 ENCOUNTER — Other Ambulatory Visit: Payer: Self-pay | Admitting: Internal Medicine

## 2011-01-03 ENCOUNTER — Ambulatory Visit: Payer: Self-pay

## 2011-01-06 ENCOUNTER — Ambulatory Visit
Admission: RE | Admit: 2011-01-06 | Discharge: 2011-01-06 | Disposition: A | Discharge status: Home / Self Care | Payer: BC Managed Care – PPO | Source: Ambulatory Visit | Attending: Obstetrics & Gynecology | Admitting: Obstetrics & Gynecology

## 2011-01-06 ENCOUNTER — Other Ambulatory Visit: Payer: Self-pay | Admitting: Internal Medicine

## 2011-01-06 DIAGNOSIS — Z1231 Encounter for screening mammogram for malignant neoplasm of breast: Secondary | ICD-10-CM

## 2011-01-15 ENCOUNTER — Encounter: Payer: Self-pay | Admitting: Internal Medicine

## 2011-01-21 ENCOUNTER — Encounter: Payer: Self-pay | Admitting: Internal Medicine

## 2011-01-21 ENCOUNTER — Ambulatory Visit (INDEPENDENT_AMBULATORY_CARE_PROVIDER_SITE_OTHER): Payer: Medicare Other | Admitting: Internal Medicine

## 2011-01-21 VITALS — BP 129/80 | HR 73 | Temp 98.1°F | Resp 12 | Ht 63.5 in | Wt 146.6 lb

## 2011-01-21 DIAGNOSIS — K219 Gastro-esophageal reflux disease without esophagitis: Secondary | ICD-10-CM

## 2011-01-21 DIAGNOSIS — M949 Disorder of cartilage, unspecified: Secondary | ICD-10-CM

## 2011-01-21 DIAGNOSIS — E559 Vitamin D deficiency, unspecified: Secondary | ICD-10-CM

## 2011-01-21 DIAGNOSIS — E039 Hypothyroidism, unspecified: Secondary | ICD-10-CM

## 2011-01-21 DIAGNOSIS — Z0000 Encounter for general adult medical examination without abnormal findings: Secondary | ICD-10-CM

## 2011-01-21 DIAGNOSIS — E785 Hyperlipidemia, unspecified: Secondary | ICD-10-CM

## 2011-01-21 NOTE — Progress Notes (Signed)
Subjective:    Patient ID: Pamela Rich, female    DOB: [birth year], 65 y.o.   MRN: [id number]  HPI Medicare Wellness Visit:  The following psychosocial & medical history were reviewed as required by Medicare.   Social history: caffeine: 2 cups coffee & 1 tea/ day , alcohol:  none ,  tobacco use : quit 1975  & exercise : walking 2-3X/week or treadmill for 37 minutes.   Home & personal  safety / fall risk: no issues, activities of daily living: no limitations , seatbelt use : yes , and smoke alarm employment : yes .  Power of Attorney/Living Will status : unsure  Vision ( as recorded per Nurse) & Hearing  evaluation :  Ophth exam 10/12was negative;hearing aids bilaterally , seen 1 week ago. Orientation :oriented X3 , memory & recall :good, spelling or math testing: good,and mood & affect : normal . Depression / anxiety: denied Travel history : never , immunization status :up to date , transfusion history:  no, and preventive health surveillance ( colonoscopies, BMD , etc as per protocol/ SOC): kink in colon precludes colonoscopy, Dental care:  every 6 months . Chart reviewed &  Updated. Active issues reviewed & addressed.       Review of Systems Patient reports no significant  adenopathy,fever,  persistant / recurrent hoarseness , swallowing issues, chest pain,palpitations,edema,persistant /recurrent cough, hemoptysis, dyspnea( rest/ exertional/paroxysmal nocturnal), gastrointestinal bleeding(melena, rectal bleeding), abdominal pain, significant heartburn,  bowel changes,GU symptoms(dysuria, hematuria,pyuria, incontinence) ), Gyn symptoms(abnormal  bleeding , pain),  syncope, focal weakness, memory loss,numbness & tingling, skin/hair /nail changes, or abnormal bruising/ bleeding   Objective:   Physical Exam Gen.: Healthy and well-nourished in appearance. Alert, appropriate and cooperative throughout exam. Head: Normocephalic without obvious abnormalities Eyes: No corneal or conjunctival  inflammation noted. Pupils equal round reactive to light and accommodation. Fundal exam is benign without hemorrhages, exudate, papilledema. Extraocular motion intact. Ears:  Hearing aids bilaterally. Nose: External nasal exam reveals no deformity or inflammation. Nasal mucosa are pink and moist. No lesions or exudates noted. Mouth: Oral mucosa and oropharynx reveal no lesions or exudates. Teeth in good repair. Neck: No deformities, masses, or tenderness noted. Range of motion & . Thyroid  normal. Lungs: Normal respiratory effort; chest expands symmetrically. Lungs are clear to auscultation without rales, wheezes, or increased work of breathing. Heart: Normal rate and rhythm. Normal S1 and S2. No gallop, click, or rub. No  murmur. Abdomen: Bowel sounds normal; abdomen soft and nontender. No masses, organomegaly or hernias noted. Genitalia: Dr Leda Quail   .                                                                                   Musculoskeletal/extremities: No deformity or scoliosis noted of  the thoracic or lumbar spine. No clubbing, cyanosis, edema, or deformity noted. Range of motion  normal .Tone & strength  normal.Joints normal. Nail health  good. Vascular: Carotid, radial artery, dorsalis pedis and  posterior tibial pulses are full and equal. No bruits present. Neurologic: Alert and oriented x3. Deep tendon reflexes symmetrical and normal.          Skin: Intact without suspicious lesions  or rashes. Lymph: No cervical, axillary lymphadenopathy present. She has a lipoma in the right axilla which transilluminates. Psych: Mood and affect are normal. Normally interactive                                                                                              Assessment & Plan:  #1 Medicare Wellness Exam; criteria met ; data entered #2 Problem List reviewed ; Assessment/ Recommendations made Plan: see Orders

## 2011-01-21 NOTE — Patient Instructions (Signed)
Preventive Health Care: Exercise  30-45  minutes a day, 3-4 days a week. Walking is especially valuable in preventing Osteoporosis. Eat a low-fat diet with lots of fruits and vegetables, up to 7-9 servings per day. Consume less than 30 grams of sugar per day from foods & drinks with High Fructose Corn Syrup as # 1,2,3 or #4 on label. Health Care Power of Attorney & Living Will place you in charge of your health care  decisions. Verify these are  in place. Recommended lifestyle interventions for Osteoporosis prevention  include calcium 600 mg twice a day  & vitamin D3 supplementation to keep vit D  level @ least 40-60. The usual vitamin D3 dose is 1000 IU daily; but individual dose is determined by annual vitamin D level monitor.  Please  schedule fasting Labs : BMET,Lipids, hepatic panel, CBC & dif, TSH, vitamin D level.

## 2011-01-24 ENCOUNTER — Other Ambulatory Visit: Payer: Self-pay | Admitting: Internal Medicine

## 2011-01-24 DIAGNOSIS — E039 Hypothyroidism, unspecified: Secondary | ICD-10-CM

## 2011-01-24 DIAGNOSIS — E785 Hyperlipidemia, unspecified: Secondary | ICD-10-CM

## 2011-01-24 DIAGNOSIS — Z0000 Encounter for general adult medical examination without abnormal findings: Secondary | ICD-10-CM

## 2011-01-24 DIAGNOSIS — T887XXA Unspecified adverse effect of drug or medicament, initial encounter: Secondary | ICD-10-CM

## 2011-01-25 ENCOUNTER — Other Ambulatory Visit (INDEPENDENT_AMBULATORY_CARE_PROVIDER_SITE_OTHER): Payer: Medicare Other

## 2011-01-25 DIAGNOSIS — E039 Hypothyroidism, unspecified: Secondary | ICD-10-CM

## 2011-01-25 DIAGNOSIS — Z0000 Encounter for general adult medical examination without abnormal findings: Secondary | ICD-10-CM

## 2011-01-25 DIAGNOSIS — T887XXA Unspecified adverse effect of drug or medicament, initial encounter: Secondary | ICD-10-CM

## 2011-01-25 DIAGNOSIS — E785 Hyperlipidemia, unspecified: Secondary | ICD-10-CM

## 2011-01-25 LAB — BASIC METABOLIC PANEL
BUN: 13 mg/dL (ref 6–23)
Calcium: 9.2 mg/dL (ref 8.4–10.5)
Creatinine, Ser: 0.9 mg/dL (ref 0.4–1.2)
GFR: 63.49 mL/min (ref >60.00)

## 2011-01-25 LAB — CBC WITH DIFFERENTIAL/PLATELET
Basophils Absolute: 0.1 10*3/uL (ref 0.0–0.1)
Eosinophils Absolute: 0.3 10*3/uL (ref 0.0–0.7)
Hemoglobin: 14.3 g/dL (ref 12.0–15.0)
Lymphocytes Relative: 25.5 % (ref 12.0–46.0)
Lymphs Abs: 2.1 10*3/uL (ref 0.7–4.0)
MCHC: 34.3 g/dL (ref 30.0–36.0)
Monocytes Relative: 8 % (ref 3.0–12.0)
Neutro Abs: 5 10*3/uL (ref 1.4–7.7)
Platelets: 335 10*3/uL (ref 150.0–400.0)
RDW: 13.5 % (ref 11.5–14.6)

## 2011-01-25 LAB — LIPID PANEL
Cholesterol: 197 mg/dL (ref 0–200)
Triglycerides: 155 mg/dL — ABNORMAL HIGH (ref 0.0–149.0)

## 2011-01-25 LAB — HEPATIC FUNCTION PANEL
AST: 21 U/L (ref 0–37)
Albumin: 4.1 g/dL (ref 3.5–5.2)
Alkaline Phosphatase: 69 U/L (ref 39–117)
Bilirubin, Direct: 0 mg/dL (ref 0.0–0.3)
Total Protein: 7.5 g/dL (ref 6.0–8.3)

## 2011-01-25 NOTE — Progress Notes (Signed)
Addended by: Legrand Como on: 01/25/2011 10:36 AM   Modules accepted: Orders

## 2011-01-25 NOTE — Progress Notes (Signed)
Labs only

## 2011-01-26 LAB — TSH: TSH: 1.09 u[IU]/mL (ref 0.35–5.50)

## 2011-01-26 LAB — VITAMIN D 25 HYDROXY (VIT D DEFICIENCY, FRACTURES): Vit D, 25-Hydroxy: 64 ng/mL (ref 30–89)

## 2011-02-07 ENCOUNTER — Other Ambulatory Visit: Payer: Self-pay | Admitting: Internal Medicine

## 2011-02-08 MED ORDER — PRAVASTATIN SODIUM 20 MG PO TABS: 20.0000 mg | ORAL_TABLET | Freq: Every day | ORAL | Status: DC

## 2011-02-08 MED ORDER — LEVOTHYROXINE SODIUM 112 MCG PO TABS: ORAL_TABLET | ORAL | Status: DC

## 2011-02-08 NOTE — Telephone Encounter (Signed)
RX's sent to pharmacy 

## 2011-04-06 ENCOUNTER — Ambulatory Visit (INDEPENDENT_AMBULATORY_CARE_PROVIDER_SITE_OTHER): Payer: Medicare Other

## 2011-04-06 DIAGNOSIS — Z23 Encounter for immunization: Secondary | ICD-10-CM

## 2011-06-27 ENCOUNTER — Encounter: Payer: Self-pay | Admitting: Internal Medicine

## 2011-06-27 ENCOUNTER — Ambulatory Visit (INDEPENDENT_AMBULATORY_CARE_PROVIDER_SITE_OTHER): Payer: Medicare Other | Admitting: Internal Medicine

## 2011-06-27 VITALS — BP 122/70 | HR 114 | Temp 99.8°F | Wt 147.6 lb

## 2011-06-27 DIAGNOSIS — J209 Acute bronchitis, unspecified: Secondary | ICD-10-CM

## 2011-07-04 MED ORDER — AZITHROMYCIN 250 MG PO TABS: ORAL_TABLET | ORAL | Status: AC

## 2011-07-04 NOTE — Progress Notes (Signed)
  Subjective:    Patient ID: Pamela Rich, female    DOB: October 07, 1945, 65 y.o.   MRN: [id number]  HPI Respiratory tract infection Onset/symptoms:12/17 as ST Exposures (illness/environmental/extrinsic):no Progression of symptoms:to cough with green sputum & head congestion as of 12/20 Treatments/response:some response to Mucinex & Tylenol Present symptoms: Fever/chills/sweats:temp > 100 Frontal headache:no Facial pain:no Nasal purulence:no Dental pain:no Lymphadenopathy:no Wheezing/shortness of breath:no Pleuritic pain:no Associated extrinsic/allergic symptoms:itchy eyes/ sneezing:no            Review of Systems     Objective:   Physical Exam General appearance is of good health and nourishment; no acute distress or increased work of breathing is present.  No  lymphadenopathy about the head, neck, or axilla noted.   Eyes: No conjunctival inflammation or lid edema is present.   Ears:  External ear exam shows no significant lesions or deformities.  Otoscopic examination reveals clear canals, tympanic membranes are intact bilaterally without bulging, retraction, inflammation or discharge.  Nose:  External nasal examination shows no deformity or inflammation. Nasal mucosa are dry without lesions or exudates. No septal dislocation .No obstruction to airflow.   Oral exam: Dental hygiene is good; lips and gums are healthy appearing.There is no oropharyngeal erythema or exudate noted.     Heart:  Normal rate and regular rhythm. S1 and S2 normal without gallop, murmur, click, rub or other extra sounds.   Lungs:Chest clear to auscultation; no wheezes, rhonchi,rales ,or rubs present.No increased work of breathing.  Slightly rattly cough  Extremities:  No cyanosis, edema, or clubbing  noted    Skin: Warm & dry       Assessment & Plan:  #1 bronchitis, acute with purulent sputum  Plan: See orders and recommendations

## 2011-07-04 NOTE — Patient Instructions (Signed)
Plain Mucinex for thick secretions ;force NON dairy fluids. Use a Neti pot daily as needed for sinus congestion  

## 2011-07-07 ENCOUNTER — Telehealth: Payer: Self-pay | Admitting: Internal Medicine

## 2011-07-07 NOTE — Telephone Encounter (Signed)
Left message on voicemail for patient informing her when asked: Dr.Hopper stated Zpak sent in if symptoms not resolved, if patient is without symptoms she does not need to pick up rx for zpak

## 2011-10-12 ENCOUNTER — Encounter: Payer: Self-pay | Admitting: Gastroenterology

## 2011-10-17 ENCOUNTER — Telehealth: Payer: Self-pay | Admitting: Gastroenterology

## 2011-10-17 NOTE — Telephone Encounter (Signed)
Left message on machine to call back  

## 2011-10-18 NOTE — Telephone Encounter (Signed)
appt scheduled for 11/11/11 1030 pt aware

## 2011-11-11 ENCOUNTER — Ambulatory Visit (INDEPENDENT_AMBULATORY_CARE_PROVIDER_SITE_OTHER): Payer: Medicare Other | Admitting: Gastroenterology

## 2011-11-11 ENCOUNTER — Encounter: Payer: Self-pay | Admitting: Gastroenterology

## 2011-11-11 VITALS — BP 120/82 | HR 80 | Ht 63.5 in | Wt 151.2 lb

## 2011-11-11 DIAGNOSIS — R933 Abnormal findings on diagnostic imaging of other parts of digestive tract: Secondary | ICD-10-CM

## 2011-11-11 DIAGNOSIS — Z1211 Encounter for screening for malignant neoplasm of colon: Secondary | ICD-10-CM

## 2011-11-11 NOTE — Progress Notes (Signed)
HPI: This is a   very pleasant 66 year old woman whom I am meeting for the first time today.  She had [facility] colonoscopy 10 years ago.  He told her that he could not complete her examination due to very tortuous colon. She does yearly stool cards and has not had any occult bleeding  She brought a copy of SMLs 12/2001 colonoscopy report.  It was complete to the cecum.  Her colon was written to be elongated and tortuous. She required fentanyl 100, Versed 10. She will members waking up during the procedure and being uncomfortable. He wrote that she should have repeat colonoscopy every 5 years as well as annual fecal occult blood testing.  Colon cancer does not run in her family.  She takes daily metamucil and her bowels are regular on that.  If she misses, she pays for it.  She will travel with it as well.   Review of systems: Pertinent positive and negative review of systems were noted in the above HPI section. Complete review of systems was performed and was otherwise normal.    Past Medical History  Diagnosis Date  . Hypothyroidism   . Hyperlipidemia   . Migraines   . Osteopenia   . Vitamin d deficiency     Past Surgical History  Procedure Date  . Septoplasty   . Polypectomy     uterine  . Tubal ligation   . Lumbar fusion     Current Outpatient Prescriptions  Medication Sig Dispense Refill  . Ascorbic Acid (VITAMIN C) 1000 MG tablet Take 1,000 mg by mouth daily.        . butalbital-acetaminophen-caffeine (FIORICET) 50-325-40 MG per tablet Take 1 tablet by mouth as needed.        . calcium carbonate (TUMS - DOSED IN MG ELEMENTAL CALCIUM) 500 MG chewable tablet Chew 1 tablet by mouth daily.        . Cholecalciferol (VITAMIN D3) 1000 UNITS CAPS Take by mouth daily.        Marland Kitchen estradiol (VIVELLE-DOT) 0.0375 MG/24HR Place 1 patch onto the skin 2 (two) times a week.        . folic acid (FOLVITE) 400 MCG tablet Take 400 mcg by mouth daily.        Marland Kitchen levothyroxine (SYNTHROID, LEVOTHROID) 112  MCG tablet 1 by mouth daily except 1 1/2 on Wed  93 tablet  3  . LORazepam (ATIVAN) 0.5 MG tablet Take 0.5 mg by mouth every 8 (eight) hours.        . Multiple Vitamin (MULTIVITAMIN PO) Take by mouth daily.        . pravastatin (PRAVACHOL) 20 MG tablet Take 1 tablet (20 mg total) by mouth daily.  90 tablet  3  . progesterone (PROMETRIUM) 100 MG capsule Take 100 mg by mouth daily.        . temazepam (RESTORIL) 15 MG capsule Take 15 mg by mouth at bedtime as needed.          Allergies as of 11/11/2011  . (No Known Allergies)    Family History  Problem Relation Age of Onset  . Hypertension Mother   . Heart disease Mother     CHF; AS  . Osteoporosis Mother   . Heart attack Mother 5  . Diabetes Father   . Stroke Father   . Other Brother     brain tumor    History   Social History  . Marital Status: Married    Spouse Name: N/A  Number of Children: 3  . Years of Education: N/A   Occupational History  . retired    Social History Main Topics  . Smoking status: Former Smoker    Quit date: 07/04/1973  . Smokeless tobacco: Never Used  . Alcohol Use: No  . Drug Use: No  . Sexually Active: Not on file   Other Topics Concern  . Not on file   Social History Narrative  . No narrative on file       Physical Exam: BP 120/82  Pulse 80  Ht 5' 3.5" (1.613 m)  Wt 151 lb 4 oz (68.607 kg)  BMI 26.37 kg/m2 Constitutional: generally well-appearing Psychiatric: alert and oriented x3 Eyes: extraocular movements intact Mouth: oral pharynx moist, no lesions Neck: supple no lymphadenopathy Cardiovascular: heart regular rate and rhythm Lungs: clear to auscultation bilaterally Abdomen: soft, nontender, nondistended, no obvious ascites, no peritoneal signs, normal bowel sounds Extremities: no lower extremity edema bilaterally Skin: no lesions on visible extremities    Assessment and plan: 66 y.o. female with  routine risk for colon cancer  Colonoscopy 10 years ago was  normal except for elongated, tortuous colon. She was recommended to have repeat colonoscopy every 5 years as well as annual fecal occult blood testing. These are quite different recommendation than our standard now. I tried to reassure her that even though her colon was documented to be tortuous previously she would probably do just fine with a repeat colonoscopy now. If she does out for colonoscopy and no polyps are seen then she would not require screening for 10 years, this would include no need for  annual fecal occult blood testing.

## 2011-11-11 NOTE — Patient Instructions (Signed)
Consider colonoscopy for routine risk screening.  Would be with MAC sedation, moveprep. Continue fiber supplements daily, even when traveling.

## 2011-12-02 ENCOUNTER — Other Ambulatory Visit: Payer: Self-pay | Admitting: Obstetrics & Gynecology

## 2011-12-02 DIAGNOSIS — Z1231 Encounter for screening mammogram for malignant neoplasm of breast: Secondary | ICD-10-CM

## 2012-01-08 ENCOUNTER — Other Ambulatory Visit: Payer: Self-pay | Admitting: Internal Medicine

## 2012-01-09 ENCOUNTER — Ambulatory Visit
Admission: RE | Admit: 2012-01-09 | Discharge: 2012-01-09 | Disposition: A | Discharge status: Home / Self Care | Payer: Medicare Other | Source: Ambulatory Visit | Attending: Obstetrics & Gynecology | Admitting: Obstetrics & Gynecology

## 2012-01-09 DIAGNOSIS — Z1231 Encounter for screening mammogram for malignant neoplasm of breast: Secondary | ICD-10-CM | POA: Diagnosis not present

## 2012-01-09 NOTE — Telephone Encounter (Signed)
Refill done.  

## 2012-01-17 DIAGNOSIS — J3089 Other allergic rhinitis: Secondary | ICD-10-CM | POA: Diagnosis not present

## 2012-01-17 DIAGNOSIS — J019 Acute sinusitis, unspecified: Secondary | ICD-10-CM | POA: Diagnosis not present

## 2012-01-23 DIAGNOSIS — D239 Other benign neoplasm of skin, unspecified: Secondary | ICD-10-CM | POA: Diagnosis not present

## 2012-01-23 DIAGNOSIS — L821 Other seborrheic keratosis: Secondary | ICD-10-CM | POA: Diagnosis not present

## 2012-01-31 ENCOUNTER — Other Ambulatory Visit: Payer: Self-pay | Admitting: Internal Medicine

## 2012-02-02 ENCOUNTER — Encounter: Payer: Medicare Other | Admitting: Internal Medicine

## 2012-03-01 ENCOUNTER — Encounter: Payer: Self-pay | Admitting: Internal Medicine

## 2012-03-01 ENCOUNTER — Ambulatory Visit (INDEPENDENT_AMBULATORY_CARE_PROVIDER_SITE_OTHER): Payer: Medicare Other | Admitting: Internal Medicine

## 2012-03-01 VITALS — BP 118/70 | HR 74 | Temp 97.4°F | Resp 12 | Ht 63.5 in | Wt 150.0 lb

## 2012-03-01 DIAGNOSIS — E785 Hyperlipidemia, unspecified: Secondary | ICD-10-CM | POA: Diagnosis not present

## 2012-03-01 DIAGNOSIS — Z0000 Encounter for general adult medical examination without abnormal findings: Secondary | ICD-10-CM

## 2012-03-01 DIAGNOSIS — E7800 Pure hypercholesterolemia, unspecified: Secondary | ICD-10-CM | POA: Insufficient documentation

## 2012-03-01 DIAGNOSIS — M899 Disorder of bone, unspecified: Secondary | ICD-10-CM

## 2012-03-01 DIAGNOSIS — E782 Mixed hyperlipidemia: Secondary | ICD-10-CM | POA: Insufficient documentation

## 2012-03-01 DIAGNOSIS — M5416 Radiculopathy, lumbar region: Secondary | ICD-10-CM

## 2012-03-01 DIAGNOSIS — IMO0002 Reserved for concepts with insufficient information to code with codable children: Secondary | ICD-10-CM | POA: Diagnosis not present

## 2012-03-01 DIAGNOSIS — E039 Hypothyroidism, unspecified: Secondary | ICD-10-CM

## 2012-03-01 DIAGNOSIS — E559 Vitamin D deficiency, unspecified: Secondary | ICD-10-CM

## 2012-03-01 MED ORDER — GABAPENTIN 100 MG PO CAPS: ORAL_CAPSULE | ORAL | Status: DC

## 2012-03-01 NOTE — Patient Instructions (Addendum)
Take the EKG to any emergency room or preop visits. There are nonspecific changes; as long as there is no new change these are not clinically significant Please  schedule fasting Labs : BMET,Lipids, hepatic panel, CBC & dif, TSH, Vitamin D level.PLEASE BRING THESE INSTRUCTIONS TO FOLLOW UP  LAB APPOINTMENT.This will guarantee correct labs are drawn, eliminating need for repeat blood sampling ( needle sticks ! ). Diagnoses /Codes: 272.4,995.20,733.90,244.9.  The best exercises for the low back include freestyle swimming, stretch aerobics, and yoga.   If you activate My Chart; the results can be released to you as soon as they populate from the lab. If you choose not to use this program; the labs have to be reviewed, copied & mailed   causing a delay in getting the results to you.

## 2012-03-01 NOTE — Addendum Note (Signed)
Addended by: Maurice Small on: 03/01/2012 03:49 PM   Modules accepted: Orders

## 2012-03-01 NOTE — Progress Notes (Signed)
Subjective:    Patient ID: Pamela Rich, female    DOB: Jun 19, 1946, 66 y.o.   MRN: [id number]  HPI Medicare Wellness Visit:  The following psychosocial & medical history were reviewed as required by Medicare.   Social history: caffeine: 2 cups / day , alcohol:  no ,  tobacco use : quit 1969 & exercise : decreased due to back issues.   Home & personal  safety / fall risk:no issues, activities of daily living: no limitations , seatbelt use : yes , and smoke alarm employment : yes .  Power of Attorney/Living Will status : in place  Vision ( as recorded per Nurse) & Hearing  evaluation : Ophth exam 10/12; recent hearing evaluation. Orientation :oriented X 3 , memory & recall :good, spelling  testing: good,and mood & affect : normal . Depression / anxiety: denied Travel history : never , immunization status :up to date , transfusion history:  no, and preventive health surveillance ( colonoscopies, BMD , etc as per protocol/ SOC): ? Colonoscopy due, Dental care:  Seen every 6 mos . Chart reviewed &  Updated. Active issues reviewed & addressed.       Review of Systems BACK PAIN: Location: L LS area  Quality: dull  Onset: 3 weeks ago after lifting mixer Worse with: standing or sitting for while Better with: NSAIDS  Radiation:Down lateral  LLE Red Flags Fecal/urinary incontinence: no Numbness/Weakness: both in RLE Fever/chills/sweats:no Night pain: in LLDP   PMH of osteoporosis or chronic steroid use: Osteopenia.Fusion Lumbar spine 1991        Objective:   Physical Exam Gen.:  well-nourished in appearance. Alert, appropriate and cooperative throughout exam. Head: Normocephalic without obvious abnormalities  Eyes: No corneal or conjunctival inflammation noted. Pupils equal round reactive to light and accommodation. Extraocular motion intact.  Ears: External  ear exam reveals no significant lesions or deformities. Canals clear .TMs normal. Hearing aids bilaterally. Nose: External  nasal exam reveals no deformity or inflammation. Nasal mucosa are pink and moist. No lesions or exudates noted.  Mouth: Oral mucosa and oropharynx reveal no lesions or exudates. Teeth in good repair. Neck: No deformities, masses, or tenderness noted. Range of motion &  Thyroid normal. Lungs: Normal respiratory effort; chest expands symmetrically. Lungs are clear to auscultation without rales, wheezes, or increased work of breathing. Heart: Normal rate and rhythm. Normal S1 and S2. No gallop, click, or rub. Intermittent Grade 1/2 over 6 systolic murmur  Abdomen: Bowel sounds normal; abdomen soft and nontender. No masses, organomegaly or hernias noted. Genitalia: Dr Leda Quail, Gyn                                     Musculoskeletal/extremities: No deformity or scoliosis noted of  the thoracic or lumbar spine. No clubbing, cyanosis, edema, or deformity noted. Range of motion  normal .Tone & strength  normal.Joints normal. Nail health  good. She is able to lie flat and sit up without help. Straight leg raising is negative to 90 bilaterally. She has mild crepitus of left knee Vascular: Carotid, radial artery, dorsalis pedis and  posterior tibial pulses are full and equal. No bruits present. Neurologic: Alert and oriented x3. Deep tendon reflexes symmetrical and normal. Tiptoe and heel walking normal without foot drop         Skin: Intact without suspicious lesions or rashes. Lymph: No cervical, axillary lymphadenopathy present. Psych: Mood and affect  are normal. Normally interactive                                                                                         Assessment & Plan:  #1 Medicare Wellness Exam; criteria met ; data entered #2 Problem List reviewed ; Assessment/ Recommendations made #3 L5 root irritation without evidence of acute disc rupture.  #4 diffuse, subtle nonspecific T wave changes (loss of voltage) versus 01/21/11. Prior to her back issues she had been walking on  treadmill for 37 minutes 2 times per week without chest pain, palpitations, claudication. She'll be asked to consider Cardiology consultation. Plan: see Orders

## 2012-03-06 ENCOUNTER — Other Ambulatory Visit (INDEPENDENT_AMBULATORY_CARE_PROVIDER_SITE_OTHER): Payer: Medicare Other

## 2012-03-06 DIAGNOSIS — M899 Disorder of bone, unspecified: Secondary | ICD-10-CM | POA: Diagnosis not present

## 2012-03-06 DIAGNOSIS — M949 Disorder of cartilage, unspecified: Secondary | ICD-10-CM | POA: Diagnosis not present

## 2012-03-06 DIAGNOSIS — E559 Vitamin D deficiency, unspecified: Secondary | ICD-10-CM | POA: Diagnosis not present

## 2012-03-06 DIAGNOSIS — E785 Hyperlipidemia, unspecified: Secondary | ICD-10-CM

## 2012-03-06 DIAGNOSIS — E039 Hypothyroidism, unspecified: Secondary | ICD-10-CM

## 2012-03-06 DIAGNOSIS — Z0000 Encounter for general adult medical examination without abnormal findings: Secondary | ICD-10-CM

## 2012-03-06 LAB — LIPID PANEL
Cholesterol: 197 mg/dL (ref 0–200)
HDL: 65.4 mg/dL (ref >39.00)
Total CHOL/HDL Ratio: 3
Triglycerides: 123 mg/dL (ref 0.0–149.0)

## 2012-03-06 LAB — CBC WITH DIFFERENTIAL/PLATELET
Basophils Absolute: 0 10*3/uL (ref 0.0–0.1)
Basophils Relative: 0.5 % (ref 0.0–3.0)
Eosinophils Absolute: 0.3 10*3/uL (ref 0.0–0.7)
Lymphocytes Relative: 24.5 % (ref 12.0–46.0)
MCHC: 33.2 g/dL (ref 30.0–36.0)
MCV: 94.1 fl (ref 78.0–100.0)
Monocytes Absolute: 0.6 10*3/uL (ref 0.1–1.0)
Neutro Abs: 5.8 10*3/uL (ref 1.4–7.7)
Neutrophils Relative %: 64.4 % (ref 43.0–77.0)
RBC: 4.61 Mil/uL (ref 3.87–5.11)
RDW: 13.6 % (ref 11.5–14.6)

## 2012-03-06 LAB — HEPATIC FUNCTION PANEL
AST: 26 U/L (ref 0–37)
Alkaline Phosphatase: 71 U/L (ref 39–117)
Bilirubin, Direct: 0 mg/dL (ref 0.0–0.3)
Total Bilirubin: 0.6 mg/dL (ref 0.3–1.2)

## 2012-03-06 LAB — BASIC METABOLIC PANEL
BUN: 15 mg/dL (ref 6–23)
CO2: 26 mEq/L (ref 19–32)
Calcium: 9.3 mg/dL (ref 8.4–10.5)
Chloride: 102 mEq/L (ref 96–112)
Creatinine, Ser: 0.8 mg/dL (ref 0.4–1.2)
Glucose, Bld: 81 mg/dL (ref 70–99)

## 2012-03-06 NOTE — Progress Notes (Signed)
Labs only

## 2012-03-07 LAB — VITAMIN D 25 HYDROXY (VIT D DEFICIENCY, FRACTURES): Vit D, 25-Hydroxy: 55 ng/mL (ref 30–89)

## 2012-03-08 DIAGNOSIS — Z0000 Encounter for general adult medical examination without abnormal findings: Secondary | ICD-10-CM | POA: Diagnosis not present

## 2012-03-08 DIAGNOSIS — Z124 Encounter for screening for malignant neoplasm of cervix: Secondary | ICD-10-CM | POA: Diagnosis not present

## 2012-03-08 DIAGNOSIS — Z01419 Encounter for gynecological examination (general) (routine) without abnormal findings: Secondary | ICD-10-CM | POA: Diagnosis not present

## 2012-03-12 ENCOUNTER — Ambulatory Visit
Admission: RE | Admit: 2012-03-12 | Discharge: 2012-03-12 | Disposition: A | Discharge status: Home / Self Care | Payer: Medicare Other | Source: Ambulatory Visit | Attending: Internal Medicine | Admitting: Internal Medicine

## 2012-03-12 DIAGNOSIS — M949 Disorder of cartilage, unspecified: Secondary | ICD-10-CM

## 2012-03-12 DIAGNOSIS — M899 Disorder of bone, unspecified: Secondary | ICD-10-CM | POA: Diagnosis not present

## 2012-03-16 ENCOUNTER — Other Ambulatory Visit: Payer: Self-pay | Admitting: Internal Medicine

## 2012-03-16 ENCOUNTER — Encounter: Payer: Self-pay | Admitting: Internal Medicine

## 2012-03-16 ENCOUNTER — Ambulatory Visit (INDEPENDENT_AMBULATORY_CARE_PROVIDER_SITE_OTHER)
Admission: RE | Admit: 2012-03-16 | Discharge: 2012-03-16 | Disposition: A | Discharge status: Home / Self Care | Payer: Medicare Other | Source: Ambulatory Visit | Attending: Internal Medicine | Admitting: Internal Medicine

## 2012-03-16 DIAGNOSIS — M5137 Other intervertebral disc degeneration, lumbosacral region: Secondary | ICD-10-CM | POA: Diagnosis not present

## 2012-03-16 DIAGNOSIS — IMO0002 Reserved for concepts with insufficient information to code with codable children: Secondary | ICD-10-CM | POA: Diagnosis not present

## 2012-03-16 DIAGNOSIS — M5416 Radiculopathy, lumbar region: Secondary | ICD-10-CM

## 2012-03-17 ENCOUNTER — Encounter: Payer: Self-pay | Admitting: Internal Medicine

## 2012-03-19 ENCOUNTER — Other Ambulatory Visit: Payer: Self-pay | Admitting: Internal Medicine

## 2012-03-19 DIAGNOSIS — M5410 Radiculopathy, site unspecified: Secondary | ICD-10-CM

## 2012-03-27 ENCOUNTER — Encounter: Payer: Self-pay | Admitting: Internal Medicine

## 2012-04-02 DIAGNOSIS — M545 Low back pain: Secondary | ICD-10-CM | POA: Diagnosis not present

## 2012-04-02 DIAGNOSIS — M47817 Spondylosis without myelopathy or radiculopathy, lumbosacral region: Secondary | ICD-10-CM | POA: Diagnosis not present

## 2012-04-02 DIAGNOSIS — M5137 Other intervertebral disc degeneration, lumbosacral region: Secondary | ICD-10-CM | POA: Diagnosis not present

## 2012-04-07 ENCOUNTER — Other Ambulatory Visit: Payer: Self-pay | Admitting: Internal Medicine

## 2012-04-08 ENCOUNTER — Other Ambulatory Visit: Payer: Self-pay | Admitting: Internal Medicine

## 2012-04-18 ENCOUNTER — Ambulatory Visit (INDEPENDENT_AMBULATORY_CARE_PROVIDER_SITE_OTHER): Payer: Medicare Other

## 2012-04-18 DIAGNOSIS — Z23 Encounter for immunization: Secondary | ICD-10-CM

## 2012-05-01 DIAGNOSIS — M545 Low back pain: Secondary | ICD-10-CM | POA: Diagnosis not present

## 2012-05-01 DIAGNOSIS — I10 Essential (primary) hypertension: Secondary | ICD-10-CM | POA: Diagnosis not present

## 2012-05-11 DIAGNOSIS — M545 Low back pain: Secondary | ICD-10-CM | POA: Diagnosis not present

## 2012-05-29 DIAGNOSIS — M961 Postlaminectomy syndrome, not elsewhere classified: Secondary | ICD-10-CM | POA: Diagnosis not present

## 2012-05-30 DIAGNOSIS — M5137 Other intervertebral disc degeneration, lumbosacral region: Secondary | ICD-10-CM | POA: Diagnosis not present

## 2012-05-30 DIAGNOSIS — S239XXA Sprain of unspecified parts of thorax, initial encounter: Secondary | ICD-10-CM | POA: Diagnosis not present

## 2012-05-30 DIAGNOSIS — M999 Biomechanical lesion, unspecified: Secondary | ICD-10-CM | POA: Diagnosis not present

## 2012-05-30 DIAGNOSIS — S335XXA Sprain of ligaments of lumbar spine, initial encounter: Secondary | ICD-10-CM | POA: Diagnosis not present

## 2012-06-04 DIAGNOSIS — M5137 Other intervertebral disc degeneration, lumbosacral region: Secondary | ICD-10-CM | POA: Diagnosis not present

## 2012-06-04 DIAGNOSIS — S239XXA Sprain of unspecified parts of thorax, initial encounter: Secondary | ICD-10-CM | POA: Diagnosis not present

## 2012-06-04 DIAGNOSIS — S335XXA Sprain of ligaments of lumbar spine, initial encounter: Secondary | ICD-10-CM | POA: Diagnosis not present

## 2012-06-04 DIAGNOSIS — M999 Biomechanical lesion, unspecified: Secondary | ICD-10-CM | POA: Diagnosis not present

## 2012-07-04 HISTORY — PX: COLONOSCOPY: SHX174

## 2012-07-13 ENCOUNTER — Telehealth: Payer: Self-pay | Admitting: Internal Medicine

## 2012-07-13 NOTE — Telephone Encounter (Signed)
Patient with pending appointment tomorrow at [facility]

## 2012-07-13 NOTE — Telephone Encounter (Signed)
Patient Information:  Caller Name: Marnisha  Phone: [phone]  Patient: Pamela Rich, Pamela Rich  Gender: Female  DOB: 1945-10-23  Age: 67 Years  PCP: Marga Melnick  Office Follow Up:  Does the office need to follow up with this patient?: No  Instructions For The Office: N/A  RN Note:  Schedule at [facility] office for evaluation  Symptoms  Reason For Call & Symptoms: Patient states onset of  Diarrhea yesterday along with Headache. Today, she has only had 2-3 loose stools. No vomiting.  Diet today - Omelet , apple sauce , english muffin, soup, Gatoraide, gingerale , coke, mint tea.  Last UOP- Within last hour  (2) Onset of Headache with diarrhea. Located around eye and temples.  Described as continous, dull pain. It is not like her migraines.  Reviewed Health History In EMR: Yes  Reviewed Medications In EMR: Yes  Reviewed Allergies In EMR: Yes  Reviewed Surgeries / Procedures: No  Date of Onset of Symptoms: 07/12/2012  Treatments Tried: Tylenol 1 this morning ,  sinus and congestion mid day  Treatments Tried Worked: No  Guideline(s) Used:  Diarrhea  Headache  Disposition Per Guideline:   See Today or Tomorrow in Office  Reason For Disposition Reached:   Unexplained headache that is present > 24 hours  Advice Given:  Reassurance:  In healthy adults, new-onset diarrhea is usually caused by a viral infection of the intestines, which you can treat at home. Diarrhea is the body's way of getting rid of the infection. Here are some tips on how to keep ahead of the fluid losses.  Here is some care advice that should help.  Fluids:  Drink more fluids, at least 8-10 glasses (8 oz or 240 ml) daily.  For example: sports drinks, diluted fruit juices, soft drinks.  Supplement this with saltine crackers or soups to make certain that you are getting sufficient fluid and salt to meet your body's needs.  Avoid caffeinated beverages (Reason: caffeine is mildly dehydrating).  Nutrition:  Maintaining  some food intake during episodes of diarrhea is important.  Ideal initial foods include boiled starches/cereals (e.g., potatoes, rice, noodles, wheat, oats) with a small amount of salt to taste.  Other acceptable foods include: bananas, yogurt, crackers, soup.  As your stools return to normal consistency, resume a normal diet.  Diarrhea Medication - Bismuth Subsalicylate (e.g., Kaopectate, PeptoBismol):  Helps reduce diarrhea, vomiting, and abdominal cramping.  Adult dosage: 2 tablets or 2 tablespoons (30 ml) by mouth every hour if diarrhea continues to a maximum of 8 doses in a 24-hour period.  Do not use for more than 2 days  This medication can make the stools look dark or even black (but not red or tarry).  Expected Course:  Viral diarrhea lasts 4-7 days. Always worse on days 1 and 2.  Call Back If:  Signs of dehydration occur (e.g., no urine for more than 12 hours, very dry mouth, lightheaded, etc.)  Diarrhea lasts over 7 days  You become worse.  Reassurance - Migraine Headache:  You have told me that this headache is similar to previous migraine headaches that you have had. If the pattern or severity of your headache changes, you will need to see your physician.  Migraine headaches are also called vascular headaches. A migraine can be anywhere from mild to severely painful. Sufferers often describe it as throbbing or pulsing. It is often just on one side. Associated symptoms include nausea and vomiting. Some individuals will have visual or other  neurological warning symptoms (aura) that a migraine is coming.  This sounds like a painful headache that you are having, but there are pain medications you can take and other instructions I can give you to reduce the pain.  Here is some care advice that should help.  Rest:   Lie down in a dark, quiet place and try to relax. Close your eyes and imagine your entire body relaxing.  Apply Cold to the Area:   Apply a cold wet washcloth or cold pack  to the forehead for 20 minutes.  Call Back If:  Headache lasts longer than 24 hours  You become worse.  Appointment Scheduled:  07/14/2012 10:15:00 Appointment Scheduled Provider:  N/A

## 2012-07-14 ENCOUNTER — Ambulatory Visit (INDEPENDENT_AMBULATORY_CARE_PROVIDER_SITE_OTHER): Payer: BC Managed Care – PPO | Admitting: Family Medicine

## 2012-07-14 ENCOUNTER — Encounter: Payer: Self-pay | Admitting: Family Medicine

## 2012-07-14 VITALS — BP 128/82 | HR 87 | Temp 97.7°F | Resp 16 | Wt 151.5 lb

## 2012-07-14 DIAGNOSIS — R197 Diarrhea, unspecified: Secondary | ICD-10-CM | POA: Insufficient documentation

## 2012-07-14 NOTE — Progress Notes (Signed)
  Subjective:    Patient ID: Pamela Rich, female    DOB: 07-17-1945, 67 y.o.   MRN: [id number]  HPI Casia is a 67 year old married female nonsmoker who comes in today with a three-day history of diarrhea   she states she has no fever chills . Urine normal.  She saw her granddaughter over the holidays who had  diarrhea. The last 12 hours she's had 7 bowel movements. No blood. Colonoscopy by Dr. Davonna Belling showed no diverticuli.    Review of Systems     Objective:   Physical Exam  well-developed and nourished female no acute distress examination the abdomen abdomen is soft bowel sounds are normal no tenderness no masses no rebound        Assessment & Plan:

## 2012-07-14 NOTE — Patient Instructions (Signed)
Rest at home  Drink lots of water  No milk or milk products until the diarrhea has completely stopped  Return when necessary

## 2012-07-20 ENCOUNTER — Encounter: Payer: Self-pay | Admitting: Internal Medicine

## 2012-07-25 ENCOUNTER — Encounter: Payer: Self-pay | Admitting: Lab

## 2012-07-26 ENCOUNTER — Ambulatory Visit (INDEPENDENT_AMBULATORY_CARE_PROVIDER_SITE_OTHER): Payer: Medicare Other | Admitting: Internal Medicine

## 2012-07-26 ENCOUNTER — Encounter: Payer: Self-pay | Admitting: Internal Medicine

## 2012-07-26 VITALS — BP 126/78 | HR 89 | Temp 98.1°F | Wt 153.0 lb

## 2012-07-26 DIAGNOSIS — IMO0002 Reserved for concepts with insufficient information to code with codable children: Secondary | ICD-10-CM

## 2012-07-26 DIAGNOSIS — M5416 Radiculopathy, lumbar region: Secondary | ICD-10-CM

## 2012-07-26 DIAGNOSIS — Z23 Encounter for immunization: Secondary | ICD-10-CM

## 2012-07-26 MED ORDER — TRAMADOL HCL 50 MG PO TABS: ORAL_TABLET | ORAL | Status: DC

## 2012-07-26 NOTE — Patient Instructions (Addendum)
The best exercises for the low back include freestyle swimming, stretch aerobics, and yoga. Review and correct the record as indicated. Please share record with all medical staff seen.

## 2012-07-26 NOTE — Progress Notes (Signed)
  Subjective:    Patient ID: Pamela Rich, female    DOB: September 12, 1945, 67 y.o.   MRN: [id number]  HPI Extremity pain Location: Left lateral calf, left ankle, and left lateral foot Onset:  Began last Aug. 2013, and continues to have pain with throughout daytime and mostly at night time. Trigger/injury: Felt "pull in left lower leg" while lifting mixer out of cabinet. Pain quality: soreness Pain severity:5/10 at this time and 7/10 during night time. Duration: intermittent Radiation:Exacerbating factors: laying down worsens pain Treatment/response: icing lower back multiple times throughout the day. Tylenol 500 mg   Lumbar spine films 03/16/12 revealed severe degenerative disc disease L4-L5 with disc space narrowing    Review of Systems Constitutional: no fever, chills, sweats, change in weight  Musculoskeletal:no  muscle cramps or pain; no  joint stiffness, redness, or swelling Skin:no rash, color change Neuro: no weakness; incontinence (stool/urine); numbness and tingling Heme:no lymphadenopathy; abnormal bruising or bleeding         Objective:   Physical Exam Gen.: Healthy and well-nourished in appearance. Alert, appropriate and cooperative throughout exam. Appears younger than stated age  Abdomen: Bowel sounds normal; abdomen soft and nontender. No masses, organomegaly or hernias noted. No aortic enlargement or bruit present                                   Musculoskeletal/extremities: No deformity or scoliosis noted of  the thoracic or lumbar spine. No clubbing, cyanosis, edema, or significant extremity  deformity noted. Range of motion normal .Tone & strength  normal.Joints normal . Nail health good. Able to lie down & sit up w/o help. Negative SLR bilaterally Vascular: Carotid, radial artery, femoral ,dorsalis pedis and  posterior tibial pulses are full and equal. No bruits present. Neurologic: Alert and oriented x3. Deep tendon reflexes symmetrical and normal. Gait including  heel & toe walking normal.        Skin: Intact without suspicious lesions or rashes. No ischemic changes present Lymph: No cervical, axillary lymphadenopathy present. Psych: Mood and affect are normal. Normally interactive                                                                                        Assessment & Plan:  #1 left L5/S1 radiculopathy. No historical or physical evidence of vascular compromise.  Plan: AS Tylenol @ bedtime and exercises for back recommended. If symptoms persist or progress; I recommend followup with Dr. Ethelene Hal

## 2012-09-03 ENCOUNTER — Encounter: Payer: Self-pay | Admitting: Internal Medicine

## 2012-09-11 ENCOUNTER — Telehealth: Payer: Self-pay | Admitting: Internal Medicine

## 2012-09-11 ENCOUNTER — Ambulatory Visit (INDEPENDENT_AMBULATORY_CARE_PROVIDER_SITE_OTHER): Payer: Medicare Other | Admitting: Internal Medicine

## 2012-09-11 ENCOUNTER — Encounter: Payer: Self-pay | Admitting: Internal Medicine

## 2012-09-11 VITALS — BP 126/70 | HR 82 | Temp 98.3°F | Wt 155.0 lb

## 2012-09-11 DIAGNOSIS — Z1211 Encounter for screening for malignant neoplasm of colon: Secondary | ICD-10-CM

## 2012-09-11 DIAGNOSIS — R030 Elevated blood-pressure reading, without diagnosis of hypertension: Secondary | ICD-10-CM

## 2012-09-11 NOTE — Progress Notes (Signed)
  Subjective:    Patient ID: Pamela Rich, female    DOB: [birth year], 67 y.o.   MRN: [id number]  HPI  Her blood pressures been mildly elevated at the gynecologist office and at the drugstore. The range is been 136/80-141/ 80s.  She's noted some intermittent racing. She describes minor intermittent exertional dyspnea.    Review of Systems She avoids sodium; she is not exercising due to LLE issues& weather . She denies chest pain, palpitations,  or claudication.      Objective:   Physical Exam Appears healthy and well-nourished & in no acute distress  No carotid bruits are present.No neck pain distention present at 10 - 15 degrees.  Heart rhythm and rate are normal with no significant murmurs or gallops.  Chest is clear with no increased work of breathing  There is no evidence of aortic aneurysm or renal artery bruits  Abdomen soft with no organomegaly or masses. No HJR  No clubbing, cyanosis or edema present.  Pedal pulses are intact   No ischemic skin changes are present . Nails healthy    Alert and oriented. Strength, tone normal          Assessment & Plan:  #1 mildly elevated blood pressure without diagnosis of hypertension  #2 intermittent subjective tachycardia and exertional dyspnea. This may be related to her lack of exercise recently.  Plan: Blood pressure goals discussed. If the symptoms persist or progress; cardiac evaluation will be pursued.

## 2012-09-11 NOTE — Telephone Encounter (Signed)
per mychart message would like to get Colonoscopy please review and advise

## 2012-09-11 NOTE — Patient Instructions (Addendum)
Minimal Blood Pressure Goal= AVERAGE < 140/90;  Ideal is an AVERAGE < 135/85. This AVERAGE should be calculated from @ least 5-7 BP readings taken @ different times of day on different days of week. You should not respond to isolated BP readings , but rather the AVERAGE for that week .Please bring your  blood pressure cuff to office visits to verify that it is reliable.It  can also be checked against the blood pressure device at the pharmacy. Finger or wrist cuffs are not dependable; an arm cuff is. To prevent racing or palpitations or premature beats, avoid stimulants such as decongestants, diet pills, nicotine, or caffeine (coffee, tea, cola, or chocolate) to excess. Cardiovascular exercise, this can be as simple a program as walking, is recommended 30-45 minutes 3-4 times per week. If you're not exercising you should take 6-8 weeks to build up to this level. If the leg symptoms persist or are aggravated by walking; water aerobics would be recommended.

## 2012-09-11 NOTE — Telephone Encounter (Signed)
Order placed

## 2012-09-12 ENCOUNTER — Encounter: Payer: Self-pay | Admitting: Gastroenterology

## 2012-10-26 ENCOUNTER — Telehealth: Payer: Self-pay | Admitting: *Deleted

## 2012-10-26 ENCOUNTER — Encounter: Payer: Self-pay | Admitting: Internal Medicine

## 2012-10-26 NOTE — Telephone Encounter (Signed)
See Pamela Rich's chart note

## 2012-10-26 NOTE — Telephone Encounter (Signed)
See phone note on Mayce Noyes, pts husband

## 2012-10-30 ENCOUNTER — Other Ambulatory Visit: Payer: Medicare Other | Admitting: Gastroenterology

## 2012-11-05 ENCOUNTER — Encounter: Payer: Self-pay | Admitting: Gastroenterology

## 2012-11-05 ENCOUNTER — Encounter: Payer: Self-pay | Admitting: *Deleted

## 2012-11-05 ENCOUNTER — Ambulatory Visit (AMBULATORY_SURGERY_CENTER): Payer: Medicare Other | Admitting: *Deleted

## 2012-11-05 VITALS — Ht 64.0 in | Wt 158.0 lb

## 2012-11-05 DIAGNOSIS — Z1211 Encounter for screening for malignant neoplasm of colon: Secondary | ICD-10-CM

## 2012-11-05 MED ORDER — PEG-KCL-NACL-NASULF-NA ASC-C 100 G PO SOLR: ORAL | Status: DC

## 2012-11-12 ENCOUNTER — Ambulatory Visit (INDEPENDENT_AMBULATORY_CARE_PROVIDER_SITE_OTHER): Payer: Medicare Other | Admitting: *Deleted

## 2012-11-12 DIAGNOSIS — Z23 Encounter for immunization: Secondary | ICD-10-CM

## 2012-11-14 ENCOUNTER — Ambulatory Visit (INDEPENDENT_AMBULATORY_CARE_PROVIDER_SITE_OTHER): Payer: Medicare Other | Admitting: Internal Medicine

## 2012-11-14 ENCOUNTER — Encounter: Payer: Self-pay | Admitting: Internal Medicine

## 2012-11-14 VITALS — BP 126/80 | HR 66 | Temp 98.5°F | Wt 156.6 lb

## 2012-11-14 DIAGNOSIS — M65849 Other synovitis and tenosynovitis, unspecified hand: Secondary | ICD-10-CM

## 2012-11-14 DIAGNOSIS — T8062XA Other serum reaction due to vaccination, initial encounter: Secondary | ICD-10-CM

## 2012-11-14 DIAGNOSIS — T50Z95A Adverse effect of other vaccines and biological substances, initial encounter: Secondary | ICD-10-CM

## 2012-11-14 DIAGNOSIS — M65839 Other synovitis and tenosynovitis, unspecified forearm: Secondary | ICD-10-CM

## 2012-11-14 MED ORDER — TRAMADOL HCL 50 MG PO TABS: 50.0000 mg | ORAL_TABLET | Freq: Four times a day (QID) | ORAL | Status: DC | PRN

## 2012-11-14 NOTE — Patient Instructions (Addendum)
Use an  ink  pen to outline the area of redness after showering each day. This should shrink in size each day. Report warning signs as discussed: Increasing pain, swelling, or red streaks emanating toward the body. Use warm moist compresses to 3 times a day to the affected area.

## 2012-11-14 NOTE — Progress Notes (Signed)
  Subjective:    Patient ID: Pamela Rich, female    DOB: 1945-08-17, 67 y.o.   MRN: [id number]  HPI  Symptoms began approximately 3 weeks ago upon awakening. She noted constant aching/soreness at the left wrist at the base of the thumb which was variably sharp especially at night. This was associated with weakness in the hand.  Nonsteroidals provided some benefit there was no significant benefit with topical ice or heat. She did use an elastic band for support.  She remembers no specific injury or trigger for the symptoms.  She had a pneumonia immunization 11/12/12 at approximately 1 PM. That evening about 6 PM she noted soreness in the general area. Yesterday she noted diffuse redness over the biceps area. The discomfort is worse when she lies in the right lateral decubitus position. She has had some night sweats. This had previously been a problem but had most recently been controlled by hormonal therapies from her gynecologist.   Review of Systems She's had no associated fever, chills, change in color or temperature of the skin in the involved area.     Objective:   Physical Exam   She appears well-nourished in no acute distress. She is obviously uncomfortable related to the right upper extremity process and left wrist discomfort  She has no lymphadenopathy about the neck or axilla.  There is an irregular area of erythema measuring 20 x 6 cm over the right upper extremity mainly over the biceps area. This is medial to the site of the injection. There is slight increased temperature to this area.  There is full range of motion of the left wrist but with some discomfort. She has tenderness to palpation at the base of the left thumb in the radial head area.. There is subtle edema in this area. Radial artery  pulses are equal and normal        Assessment & Plan:  #1 wrist tenosynovitis  #2 localized reaction to the pneumococcal vaccine without clinical evidence of  cellulitis.  Plan:Hand surgery consultation is needed for the wrist symptoms.  Warm compresses will be recommended 3 times a day for the erythema. She should report fever or red streaks going up the arm proximally.

## 2012-11-16 ENCOUNTER — Telehealth: Payer: Self-pay | Admitting: Internal Medicine

## 2012-11-16 NOTE — Telephone Encounter (Signed)
Patient would like to know if there is another hand specialist she can see. The earliest appt at [facility] is 12/14/12 and patient states she is in pain and does not want to wait that long. CB# [phone]

## 2012-11-20 ENCOUNTER — Other Ambulatory Visit: Payer: Medicare Other | Admitting: Gastroenterology

## 2012-11-24 ENCOUNTER — Encounter: Payer: Self-pay | Admitting: Internal Medicine

## 2012-11-27 NOTE — Telephone Encounter (Signed)
Patient is now scheduled with Dr. Cindee Salt at [facility] on 11/29/12.  I cancelled the appointment with [facility].  I spoke with patient she is aware of all the above.

## 2012-12-03 ENCOUNTER — Other Ambulatory Visit: Payer: Self-pay

## 2012-12-03 DIAGNOSIS — Z1231 Encounter for screening mammogram for malignant neoplasm of breast: Secondary | ICD-10-CM

## 2012-12-24 ENCOUNTER — Telehealth: Payer: Self-pay | Admitting: Obstetrics & Gynecology

## 2012-12-24 NOTE — Telephone Encounter (Signed)
Spoke with pt about prometrium Rx. Pt has been getting only a one month supply at a time and would prefer a 90 day supply so she doesn't have to reorder so often. Pt due for Aex in September. Has appt 04-02-13. Pt has enough pills to last 7-8 more days. [facility]. Will call and have them change Rx to 90 day supply refillable thru Sept when Aex due.

## 2012-12-25 ENCOUNTER — Encounter: Payer: Self-pay | Admitting: Gastroenterology

## 2012-12-25 ENCOUNTER — Other Ambulatory Visit: Payer: Medicare Other

## 2012-12-25 ENCOUNTER — Ambulatory Visit (AMBULATORY_SURGERY_CENTER): Payer: Medicare Other | Admitting: Gastroenterology

## 2012-12-25 VITALS — BP 125/74 | HR 77 | Temp 98.6°F | Resp 20 | Ht 64.0 in | Wt 158.0 lb

## 2012-12-25 DIAGNOSIS — Z1211 Encounter for screening for malignant neoplasm of colon: Secondary | ICD-10-CM

## 2012-12-25 MED ORDER — SODIUM CHLORIDE 0.9 % IV SOLN: 500.0000 mL | INTRAVENOUS | Status: DC

## 2012-12-25 NOTE — Patient Instructions (Addendum)
Discharge instructions given with verbal understanding. Normal exam. Resume previous medications. YOU HAD AN ENDOSCOPIC PROCEDURE TODAY AT THE [facility]: Refer to the procedure report that was given to you for any specific questions about what was found during the examination.  If the procedure report does not answer your questions, please call your gastroenterologist to clarify.  If you requested that your care partner not be given the details of your procedure findings, then the procedure report has been included in a sealed envelope for you to review at your convenience later.  YOU SHOULD EXPECT: Some feelings of bloating in the abdomen. Passage of more gas than usual.  Walking can help get rid of the air that was put into your GI tract during the procedure and reduce the bloating. If you had a lower endoscopy (such as a colonoscopy or flexible sigmoidoscopy) you may notice spotting of blood in your stool or on the toilet paper. If you underwent a bowel prep for your procedure, then you may not have a normal bowel movement for a few days.  DIET: Your first meal following the procedure should be a light meal and then it is ok to progress to your normal diet.  A half-sandwich or bowl of soup is an example of a good first meal.  Heavy or fried foods are harder to digest and may make you feel nauseous or bloated.  Likewise meals heavy in dairy and vegetables can cause extra gas to form and this can also increase the bloating.  Drink plenty of fluids but you should avoid alcoholic beverages for 24 hours.  ACTIVITY: Your care partner should take you home directly after the procedure.  You should plan to take it easy, moving slowly for the rest of the day.  You can resume normal activity the day after the procedure however you should NOT DRIVE or use heavy machinery for 24 hours (because of the sedation medicines used during the test).    SYMPTOMS TO REPORT IMMEDIATELY: A gastroenterologist  can be reached at any hour.  During normal business hours, 8:30 AM to 5:00 PM Monday through Friday, call [phone].  After hours and on weekends, please call the GI answering service at [phone] who will take a message and have the physician on call contact you.   Following lower endoscopy (colonoscopy or flexible sigmoidoscopy):  Excessive amounts of blood in the stool  Significant tenderness or worsening of abdominal pains  Swelling of the abdomen that is new, acute  Fever of 100F or higher  FOLLOW UP: If any biopsies were taken you will be contacted by phone or by letter within the next 1-3 weeks.  Call your gastroenterologist if you have not heard about the biopsies in 3 weeks.  Our staff will call the home number listed on your records the next business day following your procedure to check on you and address any questions or concerns that you may have at that time regarding the information given to you following your procedure. This is a courtesy call and so if there is no answer at the home number and we have not heard from you through the emergency physician on call, we will assume that you have returned to your regular daily activities without incident.  SIGNATURES/CONFIDENTIALITY: You and/or your care partner have signed paperwork which will be entered into your electronic medical record.  These signatures attest to the fact that that the information above on your After Visit Summary has been reviewed   and is understood.  Full responsibility of the confidentiality of this discharge information lies with you and/or your care-partner. 

## 2012-12-25 NOTE — Op Note (Signed)
[facility] [address] [place], [address]   COLONOSCOPY PROCEDURE REPORT  PATIENT: Pamela Rich, Pamela Rich.  MR#: [id number] BIRTHDATE: [birth year] , 67  yrs. old GENDER: Female ENDOSCOPIST: Rachael Fee, MD PROCEDURE DATE:  12/25/2012 PROCEDURE:   Colonoscopy, screening ASA CLASS:   Class II INDICATIONS:average risk screening. MEDICATIONS: Fentanyl 100 mcg IV, Versed 12 mg IV, and These medications were titrated to patient response per physician's verbal order  DESCRIPTION OF PROCEDURE:   After the risks benefits and alternatives of the procedure were thoroughly explained, informed consent was obtained.  A digital rectal exam revealed no abnormalities of the rectum.   The LB [id number]-[id number]  endoscope was introduced through the anus and advanced to the cecum, which was identified by both the appendix and ileocecal valve. No adverse events experienced.   The quality of the prep was good.  The instrument was then slowly withdrawn as the colon was fully examined.   COLON FINDINGS: A normal appearing cecum, ileocecal valve, and appendiceal orifice were identified.  The ascending, hepatic flexure, transverse, splenic flexure, descending, sigmoid colon and rectum appeared unremarkable.  No polyps or cancers were seen. Retroflexed views revealed no abnormalities. The time to cecum=8 minutes 58 seconds.  Withdrawal time=5 minutes 53 seconds.  The scope was withdrawn and the procedure completed. COMPLICATIONS: There were no complications.  ENDOSCOPIC IMPRESSION: Normal colon No polyps or cancers  RECOMMENDATIONS: You should continue to follow colorectal cancer screening guidelines for "routine risk" patients with a repeat colonoscopy in 10 years. There is no need for FOBT (stool) testing for at least 5 years.   eSigned:  Rachael Fee, MD 12/25/2012 10:26 AM   cc: Marga Melnick, MD

## 2012-12-25 NOTE — Progress Notes (Signed)
Very difficult colon .

## 2012-12-26 ENCOUNTER — Telehealth: Payer: Self-pay

## 2012-12-26 NOTE — Telephone Encounter (Signed)
  Follow up Call-  Call back number 12/25/2012  Post procedure Call Back phone  # [phone]  Permission to leave phone message Yes     Patient questions:  Do you have a fever, pain , or abdominal swelling? no Pain Score  0 *  Have you tolerated food without any problems? yes  Have you been able to return to your normal activities? yes  Do you have any questions about your discharge instructions: Diet   no Medications  no Follow up visit  no  Do you have questions or concerns about your Care? no  Actions: * If pain score is 4 or above: No action needed, pain <4.

## 2013-01-09 ENCOUNTER — Ambulatory Visit
Admission: RE | Admit: 2013-01-09 | Discharge: 2013-01-09 | Disposition: A | Discharge status: Home / Self Care | Payer: Medicare Other | Source: Ambulatory Visit

## 2013-01-09 DIAGNOSIS — Z1231 Encounter for screening mammogram for malignant neoplasm of breast: Secondary | ICD-10-CM

## 2013-02-24 ENCOUNTER — Other Ambulatory Visit: Payer: Self-pay | Admitting: Internal Medicine

## 2013-02-27 NOTE — Telephone Encounter (Signed)
Med filled. Pt due for TSH.

## 2013-03-07 ENCOUNTER — Encounter: Payer: Medicare Other | Admitting: Internal Medicine

## 2013-03-08 ENCOUNTER — Encounter: Payer: Self-pay | Admitting: Internal Medicine

## 2013-03-08 ENCOUNTER — Telehealth: Payer: Self-pay | Admitting: Internal Medicine

## 2013-03-08 ENCOUNTER — Ambulatory Visit (INDEPENDENT_AMBULATORY_CARE_PROVIDER_SITE_OTHER): Payer: Medicare Other | Admitting: Internal Medicine

## 2013-03-08 VITALS — BP 136/78 | HR 82 | Temp 97.7°F | Wt 159.4 lb

## 2013-03-08 DIAGNOSIS — R3911 Hesitancy of micturition: Secondary | ICD-10-CM

## 2013-03-08 DIAGNOSIS — R319 Hematuria, unspecified: Secondary | ICD-10-CM

## 2013-03-08 LAB — POCT URINALYSIS DIPSTICK
Bilirubin, UA: NEGATIVE
Ketones, UA: NEGATIVE
Protein, UA: 100
Spec Grav, UA: 1.01
pH, UA: 6

## 2013-03-08 MED ORDER — PHENAZOPYRIDINE HCL 200 MG PO TABS: 200.0000 mg | ORAL_TABLET | Freq: Three times a day (TID) | ORAL | Status: DC | PRN

## 2013-03-08 MED ORDER — NITROFURANTOIN MONOHYD MACRO 100 MG PO CAPS: 100.0000 mg | ORAL_CAPSULE | Freq: Two times a day (BID) | ORAL | Status: DC

## 2013-03-08 NOTE — Patient Instructions (Addendum)
Stay well hydrated. Drink to thirst up to 40 ounces of  nondairy fluids . Avoid spicy foods or alcohol as  these may aggravate the bladder. Do not take decongestants. Avoid narcotics if possible.

## 2013-03-08 NOTE — Telephone Encounter (Signed)
Patient Information:  Caller Name: Alysse  Phone: [phone]  Patient: Pamela Rich, Pamela Rich  Gender: Female  DOB: 30-Jul-1945  Age: 67 Years  PCP: Marga Melnick  Office Follow Up:  Does the office need to follow up with this patient?: No  Instructions For The Office: N/A  RN Note:  Onset of blood in urine and difficulty passing urine 03/08/13 1500.  Afebrile.  Able to urinate only a few drops, but bladder does not feel full.  Per urinary symptoms protocol, advised office visit now for age > 50years and urination pain; appt scheduled 1630 03/08/13 with Dr. Alwyn Ren.  krs/can  Symptoms  Reason For Call & Symptoms: bloody urine  Reviewed Health History In EMR: Yes  Reviewed Medications In EMR: Yes  Reviewed Allergies In EMR: Yes  Reviewed Surgeries / Procedures: Yes  Date of Onset of Symptoms: 03/08/2013  Guideline(s) Used:  Urination Pain - Female  Disposition Per Guideline:   See Today in Office  Reason For Disposition Reached:   Age > 50 years  Advice Given:  N/A  Patient Will Follow Care Advice:  YES  Appointment Scheduled:  03/08/2013 16:30:00 Appointment Scheduled Provider:  Marga Melnick

## 2013-03-08 NOTE — Telephone Encounter (Signed)
Encounter closed due to pt being seen in office by provider.

## 2013-03-08 NOTE — Progress Notes (Signed)
  Subjective:    Patient ID: Pamela Rich, female    DOB: August 29, 1945, 67 y.o.   MRN: [id number]  HPI  She had acute onset of hematuria today associated with inability to urinate. There was no specific associated dysuria or pyuria.  She drank cranberry juice with some benefit  PMH of UTIs    Review of Systems  She denies associated flank pain, fever, chills,  sweats or weight loss. She denies epistaxis, hemoptysis,  melena, or rectal bleeding.He has no  dysphagia, or abdominal pain. She has no abnormal bruising or bleeding.She has no difficulty stopping bleeding with injury.     Objective:   Physical Exam General appearance is one of good health and nourishment w/o distress.  Eyes: No conjunctival inflammation or scleral icterus is present.  Oral exam: Dental hygiene is good; lips and gums are healthy appearing.There is no oropharyngeal erythema or exudate noted.   Heart:  Normal rate and regular rhythm. S1 and S2 normal without gallop, murmur, click, rub or other extra sounds     Lungs:Chest clear to auscultation; no wheezes, rhonchi,rales ,or rubs present.No increased work of breathing.   Abdomen: bowel sounds normal, soft and non-tender without masses, organomegaly or hernias noted.  No guarding or rebound . No flank tenderness to percussion  Negative straight leg raising bilaterally  Skin:Warm & dry.  Intact without suspicious lesions or rashes ; no jaundice or tenting  Lymphatic: No lymphadenopathy is noted about the head, neck, axilla.             Assessment & Plan:  #1 hematuria with hesitancy. Urine reveals a large amount of blood, positive nitrates and moderate leukocytes.  Plan: See orders and recommendations

## 2013-03-10 ENCOUNTER — Other Ambulatory Visit: Payer: Self-pay | Admitting: Internal Medicine

## 2013-03-11 LAB — URINE CULTURE

## 2013-03-12 ENCOUNTER — Other Ambulatory Visit: Payer: Self-pay | Admitting: Obstetrics & Gynecology

## 2013-03-12 NOTE — Telephone Encounter (Signed)
Spoke with patient and she did receive her medication and is feeling better. She will follow up at her CPE.     KP

## 2013-03-12 NOTE — Telephone Encounter (Signed)
AEX scheduled for 04/02/13 #90/[facility]rf[facility]

## 2013-03-14 ENCOUNTER — Encounter: Payer: Self-pay | Admitting: Internal Medicine

## 2013-03-14 ENCOUNTER — Ambulatory Visit (INDEPENDENT_AMBULATORY_CARE_PROVIDER_SITE_OTHER): Payer: Medicare Other | Admitting: Internal Medicine

## 2013-03-14 ENCOUNTER — Other Ambulatory Visit: Payer: Self-pay | Admitting: Internal Medicine

## 2013-03-14 VITALS — BP 132/77 | HR 73 | Temp 98.4°F | Ht 63.75 in | Wt 155.2 lb

## 2013-03-14 DIAGNOSIS — M899 Disorder of bone, unspecified: Secondary | ICD-10-CM

## 2013-03-14 DIAGNOSIS — Z0000 Encounter for general adult medical examination without abnormal findings: Secondary | ICD-10-CM

## 2013-03-14 DIAGNOSIS — K219 Gastro-esophageal reflux disease without esophagitis: Secondary | ICD-10-CM

## 2013-03-14 DIAGNOSIS — E559 Vitamin D deficiency, unspecified: Secondary | ICD-10-CM

## 2013-03-14 DIAGNOSIS — M1990 Unspecified osteoarthritis, unspecified site: Secondary | ICD-10-CM

## 2013-03-14 DIAGNOSIS — E782 Mixed hyperlipidemia: Secondary | ICD-10-CM

## 2013-03-14 DIAGNOSIS — E039 Hypothyroidism, unspecified: Secondary | ICD-10-CM

## 2013-03-14 LAB — BASIC METABOLIC PANEL
Chloride: 102 mEq/L (ref 96–112)
GFR: 78.23 mL/min (ref >60.00)
Potassium: 3.7 mEq/L (ref 3.5–5.1)
Sodium: 137 mEq/L (ref 135–145)

## 2013-03-14 LAB — CBC WITH DIFFERENTIAL/PLATELET
Basophils Relative: 0.3 % (ref 0.0–3.0)
Eosinophils Relative: 2.6 % (ref 0.0–5.0)
HCT: 43.1 % (ref 36.0–46.0)
Hemoglobin: 14.5 g/dL (ref 12.0–15.0)
Lymphs Abs: 1.8 10*3/uL (ref 0.7–4.0)
MCV: 92.5 fl (ref 78.0–100.0)
Monocytes Absolute: 0.6 10*3/uL (ref 0.1–1.0)
Monocytes Relative: 6.1 % (ref 3.0–12.0)
RBC: 4.66 Mil/uL (ref 3.87–5.11)
WBC: 9.3 10*3/uL (ref 4.5–10.5)

## 2013-03-14 LAB — HEPATIC FUNCTION PANEL
ALT: 18 U/L (ref 0–35)
AST: 24 U/L (ref 0–37)
Total Bilirubin: 0.7 mg/dL (ref 0.3–1.2)
Total Protein: 7.5 g/dL (ref 6.0–8.3)

## 2013-03-14 LAB — TSH: TSH: 0.35 u[IU]/mL (ref 0.35–5.50)

## 2013-03-14 NOTE — Progress Notes (Signed)
Subjective:    Patient ID: Pamela Rich, female    DOB: 1945-10-19, 67 y.o.   MRN: [id number]  HPI Medicare Wellness Visit:  Psychosocial & medical history were reviewed as required by Medicare (abuse,antisocial behavioral risks,firearm risk).  Social history: caffeine: 2 cups coffee/ day , alcohol: no  ,  tobacco use: quit 1976 Exercise :  See below Home & personal  safety / fall risk:no Limitations of activities of daily living:no Seatbelt  and smoke alarm use:yes Power of Attorney/Living Will status : in place Ophthalmology exam status :current Hearing evaluation status:current Orientation :oriented X 3 Memory & recall :good Spelling testing:good Active depression / anxiety:denied Foreign travel history : never Immunization status for Shingles /Flu/ PNA/ tetanus : flu needed Transfusion history:  no Preventive health surveillance status of colonoscopy, BMD , mammograms,PAP as per protocol/ SOC: Gyn pending Dental care: every 6 mos  Chart reviewed &  Updated. Active issues reviewed & addressed.      Review of Systems She is on a heart healthy diet; she exercises as walking> 40 minutes 2- 3 times per week without symptoms. Specifically she denies chest pain, palpitations, dyspnea, or claudication. Family history is negative for premature coronary disease .  Advanced cholesterol testing not performed to date.. There is medication compliance with the statin. Significant abdominal symptoms, memory deficit, or myalgias denied. .     Objective:   Physical Exam Gen.: Healthy and well-nourished in appearance. Alert, appropriate and cooperative throughout exam.Appears younger than stated age  Head: Normocephalic without obvious abnormalities  Eyes: No corneal or conjunctival inflammation noted. Pupils equal round reactive to light and accommodation.  Extraocular motion intact. Distant monocular lens worn. Ears: External  ear exam reveals no significant lesions or deformities.  Canals clear .TMs normal. Hearing aids bilaterally. Nose: External nasal exam reveals no deformity or inflammation. Nasal mucosa are pink and moist. No lesions or exudates noted.   Mouth: Oral mucosa and oropharynx reveal no lesions or exudates. Teeth in good repair. Neck: No deformities, masses, or tenderness noted. Range of motion & Thyroid normal. Lungs: Normal respiratory effort; chest expands symmetrically. Lungs are clear to auscultation without rales, wheezes, or increased work of breathing. Heart: Normal rate and rhythm. Normal S1 and S2. No gallop, click, or rub. No murmur. Abdomen: Bowel sounds normal; abdomen soft and nontender. No masses, organomegaly or hernias noted. Genitalia: As per Gyn                                  Musculoskeletal/extremities: Slightly accentuated curvature of upper thoracic  Spine. No clubbing, cyanosis, edema, or significant extremity  deformity noted. Range of motion normal .Tone & strength  Normal. Joints normal . Nail health good. Able to lie down & sit up w/o help. Negative SLR bilaterally Vascular: Carotid, radial artery, dorsalis pedis and  posterior tibial pulses are full and equal. No bruits present. Neurologic: Alert and oriented x3. Deep tendon reflexes symmetrical and normal.         Skin: Intact without suspicious lesions or rashes. Lymph: No cervical, axillary lymphadenopathy present. Psych: Mood and affect are normal. Normally interactive  Assessment & Plan:  #1 Medicare Wellness Exam; criteria met ; data entered #2 Problem List/Diagnoses reviewed Plan:  Assessments made/ Orders entered  

## 2013-03-14 NOTE — Patient Instructions (Addendum)
Share results with all non [place] medical staff seen  

## 2013-03-15 LAB — NMR LIPOPROFILE WITH LIPIDS

## 2013-03-20 LAB — VITAMIN D 1,25 DIHYDROXY: Vitamin D 1, 25 (OH)2 Total: 45 pg/mL (ref 18–72)

## 2013-03-22 ENCOUNTER — Telehealth: Payer: Self-pay | Admitting: *Deleted

## 2013-03-22 DIAGNOSIS — E039 Hypothyroidism, unspecified: Secondary | ICD-10-CM

## 2013-03-22 MED ORDER — LEVOTHYROXINE SODIUM 112 MCG PO TABS: 112.0000 ug | ORAL_TABLET | Freq: Every day | ORAL | Status: DC

## 2013-03-22 NOTE — Telephone Encounter (Signed)
Pt stated that she did not understand the note in MyChart regarding her thyroid dose.  She stated that the note in My Chart says she is taking only a half a pill on Wed, when she is really it taking 1 1/2 on Wed.   So she wanted to know if she still need to take her pill the way the note ask her to take it.  Informed the pt that I spoke with Dr. Alwyn Ren and he stated that it was an error in the chart and that he would like for her to take 1 pill daily and recheck her TSH in 10 weeks.  Pt agreed and asked if we could call in the rx with the new directions to [facility].  New rx sent to pharmacy by e-script and future order for TSH was sent.  Informed the pt to go ahead and schedule an lab appt for 10 weeks.  Pt agreed.//AB/CMA

## 2013-03-22 NOTE — Addendum Note (Signed)
Addended by: Silvio Pate D on: 03/22/2013 01:53 PM   Modules accepted: Orders

## 2013-03-25 LAB — NMR LIPOPROFILE WITH LIPIDS
HDL Size: 8.8 nm — ABNORMAL LOW (ref >=9.2)
HDL-C: 66 mg/dL (ref >=40)
LDL Particle Number: 1393 nmol/L — ABNORMAL HIGH (ref <1000)
LDL Size: 21.4 nm (ref >20.5)
Large HDL-P: 4.4 umol/L — ABNORMAL LOW (ref >=4.8)
Triglycerides: 117 mg/dL (ref <150)
VLDL Size: 43.7 nm (ref <=46.6)

## 2013-03-26 ENCOUNTER — Encounter: Payer: Self-pay | Admitting: Internal Medicine

## 2013-04-02 ENCOUNTER — Encounter: Payer: Self-pay | Admitting: Obstetrics & Gynecology

## 2013-04-02 ENCOUNTER — Ambulatory Visit (INDEPENDENT_AMBULATORY_CARE_PROVIDER_SITE_OTHER): Payer: Medicare Other | Admitting: Obstetrics & Gynecology

## 2013-04-02 VITALS — BP 122/80 | HR 68 | Resp 16 | Ht 63.25 in | Wt 156.0 lb

## 2013-04-02 DIAGNOSIS — Z124 Encounter for screening for malignant neoplasm of cervix: Secondary | ICD-10-CM

## 2013-04-02 DIAGNOSIS — Z01419 Encounter for gynecological examination (general) (routine) without abnormal findings: Secondary | ICD-10-CM

## 2013-04-02 MED ORDER — ESTRADIOL 0.075 MG/24HR TD PTTW: 1.0000 | MEDICATED_PATCH | TRANSDERMAL | Status: DC

## 2013-04-02 MED ORDER — PROGESTERONE MICRONIZED 100 MG PO CAPS: 100.0000 mg | ORAL_CAPSULE | Freq: Every day | ORAL | Status: DC

## 2013-04-02 NOTE — Progress Notes (Signed)
67 y.o. G4P3 MarriedCaucasianF here for annual exam.  Doing well.  Sees Dr. Alwyn Ren.  Waiting for particle testing results.  No VB.  Patient's last menstrual period was 07/04/2000.          Sexually active: no  The current method of family planning is none.    Exercising: yes  walking Smoker:  no  Health Maintenance: Pap:  02/21/11 WNL History of abnormal Pap:  no MMG:  01/09/13 normal Colonoscopy:  6/14, Dr. Christella Hartigan, f/u 10 yrs, FOB testing in 5 yrs BMD:   7/12 TDaP:  2007 Screening Labs: PCP, Hb today: PCP, Urine today: PCP   reports that she quit smoking about 38 years ago. She has never used smokeless tobacco. She reports that she does not drink alcohol or use illicit drugs.  Past Medical History  Diagnosis Date  . Hypothyroidism   . Hyperlipidemia   . Migraines   . Osteopenia   . Vitamin D deficiency   . Heart murmur   . Allergy     SEASONAL  . Fibroid     Past Surgical History  Procedure Laterality Date  . Septoplasty    . Polypectomy      uterine  . Tubal ligation    . Lumbar fusion      Dr Gerlene Fee  . Colonoscopy  2014    negative , Dr Christella Hartigan  . Cortisone injections      in left hand    Current Outpatient Prescriptions  Medication Sig Dispense Refill  . Ascorbic Acid (VITAMIN C) 1000 MG tablet Take 1,000 mg by mouth daily.        . calcium carbonate (TUMS - DOSED IN MG ELEMENTAL CALCIUM) 500 MG chewable tablet Chew 1 tablet by mouth daily.        . Cholecalciferol (VITAMIN D3) 1000 UNITS CAPS Take by mouth daily.        Marland Kitchen estradiol (VIVELLE-DOT) 0.075 MG/24HR Place 1 patch onto the skin 2 (two) times a week.      . folic acid (FOLVITE) 400 MCG tablet Take 400 mcg by mouth daily.        Marland Kitchen levothyroxine (SYNTHROID, LEVOTHROID) 112 MCG tablet Take 1 tablet (112 mcg total) by mouth daily before breakfast.  90 tablet  0  . Multiple Vitamin (MULTIVITAMIN PO) Take by mouth daily.        . pravastatin (PRAVACHOL) 20 MG tablet TAKE 1 TABLET DAILY  90 tablet  1  .  progesterone (PROMETRIUM) 100 MG capsule TAKE 1 CAPSULE AT BEDTIME  90 capsule  0   No current facility-administered medications for this visit.    Family History  Problem Relation Age of Onset  . Hypertension Mother   . Heart disease Mother     CHF; AS  . Osteoporosis Mother   . Heart attack Mother 68  . Diabetes Father   . Stroke Father     onset in 37s  . Cancer Brother     malignant brain tumor  . Colon cancer Neg Hx   . Esophageal cancer Neg Hx   . Rectal cancer Neg Hx   . Stomach cancer Neg Hx     ROS:  Pertinent items are noted in HPI.  Otherwise, a comprehensive ROS was negative.  Exam:   BP 122/80  Pulse 68  Resp 16  Ht 5' 3.25" (1.607 m)  Wt 156 lb (70.761 kg)  BMI 27.4 kg/m2  LMP 07/04/2000  Weight change: +3lbs  Height: 5' 3.25" (160.7  cm)  Ht Readings from Last 3 Encounters:  04/02/13 5' 3.25" (1.607 m)  03/14/13 5' 3.75" (1.619 m)  12/25/12 5\' 4"  (1.626 m)    General appearance: alert, cooperative and appears stated age Head: Normocephalic, without obvious abnormality, atraumatic Neck: no adenopathy, supple, symmetrical, trachea midline and thyroid normal to inspection and palpation Lungs: clear to auscultation bilaterally Breasts: normal appearance, no masses or tenderness Heart: regular rate and rhythm Abdomen: soft, non-tender; bowel sounds normal; no masses,  no organomegaly Extremities: extremities normal, atraumatic, no cyanosis or edema Skin: Skin color, texture, turgor normal. No rashes or lesions Lymph nodes: Cervical, supraclavicular, and axillary nodes normal. No abnormal inguinal nodes palpated Neurologic: Grossly normal   Pelvic: External genitalia:  no lesions              Urethra:  normal appearing urethra with no masses, tenderness or lesions              Bartholins and Skenes: normal                 Vagina: normal appearing vagina with normal color and discharge, no lesions              Cervix: no lesions              Pap taken:  yes Bimanual Exam:  Uterus:  normal size, contour, position, consistency, mobility, non-tender              Adnexa: normal adnexa and no mass, fullness, tenderness               Rectovaginal: Confirms               Anus:  normal sphincter tone, no lesions  A:  Well Woman with normal exam PMP, on HRT Hypothyroidism Elevated lipids Osteopenia  P:   Mammogram yearly pap smear today Vivelle dot 0.075mg  patches 1/2 patch twice weekly and Prometrium 100mg  daily.  90 day supply with 1year RF to mail order pharmacy Labs with Dr. Alwyn Ren return annually or prn  An After Visit Summary was printed and given to the patient.

## 2013-04-03 LAB — IPS PAP SMEAR ONLY

## 2013-04-16 ENCOUNTER — Ambulatory Visit (INDEPENDENT_AMBULATORY_CARE_PROVIDER_SITE_OTHER): Payer: Medicare Other

## 2013-04-16 DIAGNOSIS — Z23 Encounter for immunization: Secondary | ICD-10-CM

## 2013-06-03 ENCOUNTER — Other Ambulatory Visit: Payer: Self-pay | Admitting: Internal Medicine

## 2013-06-03 ENCOUNTER — Other Ambulatory Visit (INDEPENDENT_AMBULATORY_CARE_PROVIDER_SITE_OTHER): Payer: Medicare Other

## 2013-06-03 DIAGNOSIS — E039 Hypothyroidism, unspecified: Secondary | ICD-10-CM

## 2013-06-03 LAB — TSH: TSH: 3.92 u[IU]/mL (ref 0.35–5.50)

## 2013-07-01 ENCOUNTER — Other Ambulatory Visit: Payer: Self-pay | Admitting: Obstetrics & Gynecology

## 2013-08-14 ENCOUNTER — Telehealth: Payer: Self-pay | Admitting: Emergency Medicine

## 2013-08-14 NOTE — Telephone Encounter (Signed)
Call to pt's home number. Rings and rings. No VM.

## 2013-08-14 NOTE — Telephone Encounter (Signed)
Call to cell number. Spoke with pt to advise Dr. Sabra Heck would be in the office tomorrow to write Rx to fax to pharmacy. She can pick up Rx tomorrow. Pt very appreciative.

## 2013-08-14 NOTE — Telephone Encounter (Signed)
I know pt well.  She gets rx like this every few years.  I have to do it when i am in the office so rx can be printed and faxed but will take care of it tomorrow.  You can let pt know.

## 2013-08-14 NOTE — Telephone Encounter (Signed)
Patient calling with request for refill of Fiorinal for migraines. Last filled in 2012 and was on Fioricet but she feels the medication with the ASA works much better.   Spoke with patient, she states "this is nothing new and it's been going on for awhile now". She complains of migraine headaches that usually start in shoulders, back and neck and then radiates to temple area with light sensitivity and feeling nauseated. States that theses more recent headaches have started around January 7 which is about the same time that her husband began treatment for cancer. She is requesting medication as excedrin is not working as well as it usually does and feels that the Fiorinal will work to "knock it out before it gets worse". Patient denies any weakness or visual changes.   Advised would send request to Dr. Sabra Heck and discuss when I have a response, patient would like Dr. Sabra Heck to review request and is okay with waiting for a response until Thursday when Dr. Sabra Heck in office. Rx request sent to [facility] on [address].

## 2013-08-15 ENCOUNTER — Other Ambulatory Visit: Payer: Self-pay | Admitting: Obstetrics & Gynecology

## 2013-08-15 MED ORDER — BUTALBITAL-ASPIRIN-CAFFEINE 50-325-40 MG PO CAPS: 1.0000 | ORAL_CAPSULE | Freq: Four times a day (QID) | ORAL | Status: DC | PRN

## 2013-08-15 MED ORDER — BUTALBITAL-APAP-CAFFEINE 50-325-40 MG PO TABS: 2.0000 | ORAL_TABLET | Freq: Two times a day (BID) | ORAL | Status: DC | PRN

## 2013-08-15 MED ORDER — BUTALBITAL-ASPIRIN-CAFFEINE 50-325-40 MG PO CAPS
1.0000 | ORAL_CAPSULE | Freq: Four times a day (QID) | ORAL | Status: DC | PRN
Start: 2013-08-15 — End: 2014-02-14

## 2013-08-15 NOTE — Telephone Encounter (Signed)
[facility] to [facility] confirmed receipt.  Patient aware and pick up rx.  Encounter closed.

## 2013-08-15 NOTE — Telephone Encounter (Signed)
Patient calling to check on status of request as the pharmacy does not have an RX for her yet.

## 2013-12-02 ENCOUNTER — Encounter: Payer: Self-pay | Admitting: Internal Medicine

## 2013-12-02 ENCOUNTER — Other Ambulatory Visit (INDEPENDENT_AMBULATORY_CARE_PROVIDER_SITE_OTHER): Payer: Medicare Other

## 2013-12-02 ENCOUNTER — Ambulatory Visit (INDEPENDENT_AMBULATORY_CARE_PROVIDER_SITE_OTHER): Payer: Medicare Other | Admitting: Internal Medicine

## 2013-12-02 VITALS — BP 120/76 | HR 63 | Temp 98.2°F | Wt 137.4 lb

## 2013-12-02 DIAGNOSIS — K219 Gastro-esophageal reflux disease without esophagitis: Secondary | ICD-10-CM

## 2013-12-02 DIAGNOSIS — E039 Hypothyroidism, unspecified: Secondary | ICD-10-CM

## 2013-12-02 DIAGNOSIS — IMO0001 Reserved for inherently not codable concepts without codable children: Secondary | ICD-10-CM

## 2013-12-02 DIAGNOSIS — E782 Mixed hyperlipidemia: Secondary | ICD-10-CM

## 2013-12-02 LAB — TSH: TSH: 1.66 u[IU]/mL (ref 0.35–4.50)

## 2013-12-02 LAB — CK: Total CK: 65 U/L (ref 7–177)

## 2013-12-02 MED ORDER — PRAVASTATIN SODIUM 20 MG PO TABS: ORAL_TABLET | ORAL | Status: DC

## 2013-12-02 NOTE — Assessment & Plan Note (Addendum)
TSH 0.35 in 12/14 TSH to assess optimal dose; Rx will be sent to [facility]

## 2013-12-02 NOTE — Patient Instructions (Signed)
Your next office appointment will be determined based upon review of your pending labs . Those instructions will be transmitted to you through My Chart  Please perform isometric exercises before going to bed. Sit on side of the bed and raise up on toes to a count of 5. Then put pressure on the heels to a count of 5. Repeat this process 10 times. This will improve blood flow to the legs & help prevent cramps. Magcal may help the muscle symptoms as we discussed.

## 2013-12-02 NOTE — Progress Notes (Signed)
Subjective:    Patient ID: Pamela Rich, female    DOB: 14-Apr-1946, 68 y.o.   MRN: [id number]  HPI She is here to assess active health issues & conditions. PMH, FH, & Social history verified & updated    A heart healthy diet is followed; exercise encompasses 40 minutes 3  times per week as walking without symptoms.    Advanced cholesterol testing reveals  LDL goal is less than 105 ; ideally < 75 . There is medication compliance with the statin.  Low dose ASA taken  There has been no change in the dose, brand, or mode of administration of thyroid supplement Last TSH  0.35 in 12/14    Review of Systems Specifically denied are  chest pain, palpitations, dyspnea, or claudication.  Significant memory deficit or myalgias not present. Some shin & inferior thigh  pain @ night mainly in LLE immediately above the knee. Constitutional: Significant change in weight of 20# with TLC.No  significant fatigue; sleep disorder; change in appetite. Eye: no blurred, double ,loss of vision ENT/GI: no constipation; diarrhea;hoarseness;dysphagia. GERD symptoms resolved with diet & weight loss. Derm: no change in nails,hair,skin Neuro: no numbness or tingling; tremor Psych:no anxiety; depression; panic attacks Endo: no temperature intolerance to heat ,cold     Objective:   Physical Exam Gen.: Healthy and well-nourished in appearance. Alert, appropriate and cooperative throughout exam. Appears younger than stated age  Head: Normocephalic without obvious abnormalities  Eyes: No corneal or conjunctival inflammation noted. Pupils equal round reactive to light and accommodation. Extraocular motion intact.  Ears: External  ear exam reveals no significant lesions or deformities. Canals clear .TMs normal. Hearing aids bilaterally. Nose: External nasal exam reveals no deformity or inflammation. Nasal mucosa are pink and moist. No lesions or exudates noted.   Mouth: Oral mucosa and oropharynx reveal no  lesions or exudates. Teeth in good repair. Neck: No deformities, masses, or tenderness noted. Range of motion & Thyroid normal Lungs: Normal respiratory effort; chest expands symmetrically. Lungs are clear to auscultation without rales, wheezes, or increased work of breathing. Heart: Normal rate and rhythm. Normal S1 and S2. No gallop, click, or rub. No murmur. Abdomen: Bowel sounds normal; abdomen soft and nontender. No masses, organomegaly or hernias noted. Genitalia: : as per Gyn                                  Musculoskeletal/extremities: No deformity or scoliosis noted of  the thoracic or lumbar spine. No clubbing, cyanosis, edema, or significant extremity  deformity noted. Range of motion normal .Tone & strength normal. Hand joints normal Fingernail / toenail health good.Minor L knee crepitus Able to lie down & sit up w/o help. Negative SLR bilaterally Vascular: Carotid, radial artery, dorsalis pedis and  posterior tibial pulses are full and equal. No bruits present. Neurologic: Alert and oriented x3. Deep tendon reflexes symmetrical and normal.  Gait normal       Skin: Intact without suspicious lesions or rashes. Lymph: No cervical, axillary lymphadenopathy present. Psych: Mood and affect are normal. Normally interactive  Assessment & Plan:  See Current Assessment & Plan in Problem List under specific Diagnosis

## 2013-12-02 NOTE — Progress Notes (Signed)
Pre visit review using our clinic review tool, if applicable. No additional management support is needed unless otherwise documented below in the visit note. 

## 2013-12-02 NOTE — Assessment & Plan Note (Addendum)
CK to evaluate leg symptoms

## 2013-12-03 ENCOUNTER — Other Ambulatory Visit: Payer: Self-pay | Admitting: Internal Medicine

## 2013-12-03 ENCOUNTER — Telehealth: Payer: Self-pay | Admitting: Internal Medicine

## 2013-12-03 DIAGNOSIS — E782 Mixed hyperlipidemia: Secondary | ICD-10-CM

## 2013-12-03 MED ORDER — LEVOTHYROXINE SODIUM 112 MCG PO TABS: 112.0000 ug | ORAL_TABLET | Freq: Every day | ORAL | Status: DC

## 2013-12-03 MED ORDER — PRAVASTATIN SODIUM 20 MG PO TABS: ORAL_TABLET | ORAL | Status: DC

## 2013-12-03 NOTE — Telephone Encounter (Signed)
Pt request new Rx for pravastatin to be [facility]. Pt was here yesterday for ov. Please advise.

## 2013-12-05 ENCOUNTER — Telehealth: Payer: Self-pay

## 2013-12-05 ENCOUNTER — Other Ambulatory Visit: Payer: Self-pay

## 2013-12-05 DIAGNOSIS — Z1231 Encounter for screening mammogram for malignant neoplasm of breast: Secondary | ICD-10-CM

## 2013-12-05 NOTE — Telephone Encounter (Signed)
Phone call from patient stating she was seen this week and requested Pravastatin and Levothroid be sent to [facility]. She says she received the Pravastatin but not the Levothroid. I let her know she should check with [facility] because that was electronically sent at the same time as the Pravastatin. She states she will wait a day or so and then contact them.

## 2014-01-13 ENCOUNTER — Ambulatory Visit
Admission: RE | Admit: 2014-01-13 | Discharge: 2014-01-13 | Disposition: A | Discharge status: Home / Self Care | Payer: Medicare Other | Source: Ambulatory Visit

## 2014-01-13 DIAGNOSIS — Z1231 Encounter for screening mammogram for malignant neoplasm of breast: Secondary | ICD-10-CM

## 2014-01-27 ENCOUNTER — Ambulatory Visit (INDEPENDENT_AMBULATORY_CARE_PROVIDER_SITE_OTHER): Payer: Medicare Other | Admitting: Internal Medicine

## 2014-01-27 VITALS — BP 130/70 | HR 63 | Temp 97.8°F | Resp 18 | Ht 63.0 in | Wt 134.8 lb

## 2014-01-27 DIAGNOSIS — L821 Other seborrheic keratosis: Secondary | ICD-10-CM

## 2014-01-27 NOTE — Progress Notes (Signed)
   Subjective:  This chart was scribed for Leandrew Koyanagi, MD by Ladene Artist, ED Scribe. The patient was seen in room 13. Patient's care was started at 8:04 PM.    Patient ID: Pamela Rich, female    DOB: Feb 25, 1946, 68 y.o.   MRN: [id number]  Chief Complaint  Patient presents with  . Tick Removal    noticed today   HPI HPI Comments: Pamela Rich is a 68 y.o. female who presents to the [facility] complaining of suspected tick bite under her R breast. She states that she initially noticed the area today while taking a shower. Pt reports that [place]    Past Medical History  Diagnosis Date  . Hypothyroidism   . Hyperlipidemia   . Migraines   . Osteopenia   . Vitamin D deficiency   . Heart murmur   . Allergy     SEASONAL  . Fibroid     no surgery   Current Outpatient Prescriptions on File Prior to Visit  Medication Sig Dispense Refill  . Ascorbic Acid (VITAMIN C) 1000 MG tablet Take 1,000 mg by mouth daily.        . butalbital-aspirin-caffeine (FIORINAL) 50-325-40 MG per capsule Take 1 capsule by mouth every 6 (six) hours as needed for headache or migraine.  30 capsule  0  . calcium carbonate (TUMS - DOSED IN MG ELEMENTAL CALCIUM) 500 MG chewable tablet Chew 1 tablet by mouth daily.        . Cholecalciferol (VITAMIN D3) 1000 UNITS CAPS Take by mouth daily.        Marland Kitchen estradiol (VIVELLE-DOT) 0.075 MG/24HR Place 1 patch onto the skin 2 (two) times a week.  24 patch  4  . folic acid (FOLVITE) 786 MCG tablet Take 400 mcg by mouth daily.        Marland Kitchen levothyroxine (SYNTHROID, LEVOTHROID) 112 MCG tablet Take 1 tablet (112 mcg total) by mouth daily before breakfast.  90 tablet  3  . Multiple Vitamin (MULTIVITAMIN PO) Take by mouth daily.        . pravastatin (PRAVACHOL) 20 MG tablet TAKE 1 TABLET DAILY  90 tablet  3  . progesterone (PROMETRIUM) 100 MG capsule Take 1 capsule (100 mg total) by  mouth daily.  90 capsule  4   No current facility-administered medications on file prior to visit.   Allergies  Allergen Reactions  . Pneumococcal Vaccines     Are weakness, redness, and swelling   . Gabapentin     constipation   Review of Systems  Constitutional: Negative.       Objective:   Physical Exam  Pulmonary/Chest:  On chest wall next to R breast is a small seb keratosis  There is a second similar lesion on the L side of her neck  Skin: Lesion noted.      Assessment & Plan:   I personally performed the services described in this documentation, which was scribed in my presence. The recorded information has been reviewed and is accurate.  Seborrheic keratoses  reassured

## 2014-02-14 ENCOUNTER — Other Ambulatory Visit: Payer: Self-pay | Admitting: Obstetrics & Gynecology

## 2014-02-14 NOTE — Telephone Encounter (Signed)
Last AEX 04/02/13 Last refill 08/15/13 #30/0R No future appt.   Please advise.

## 2014-02-18 NOTE — Telephone Encounter (Signed)
Pt calling to check on refill request for migraine medication.

## 2014-02-19 ENCOUNTER — Telehealth: Payer: Self-pay | Admitting: Obstetrics & Gynecology

## 2014-02-19 ENCOUNTER — Other Ambulatory Visit: Payer: Self-pay | Admitting: Obstetrics & Gynecology

## 2014-02-19 NOTE — Telephone Encounter (Signed)
Rx faxed. Patient notified.

## 2014-02-19 NOTE — Telephone Encounter (Signed)
Left message to call [name] at [phone]. 

## 2014-02-19 NOTE — Telephone Encounter (Signed)
Patient is asking to talk with Dr.Miller's nurse regarding her prescription.

## 2014-02-21 NOTE — Telephone Encounter (Signed)
Spoke with patient. Patient states that she was calling regarding a refill for florinal. States that refill has already been sent in and that she has picked it up. Refill was sent on 8/18 #30 0RF.  Routing to provider for final review. Patient agreeable to disposition. Will close encounter

## 2014-03-03 ENCOUNTER — Telehealth: Payer: Self-pay | Admitting: Obstetrics & Gynecology

## 2014-03-03 MED ORDER — ESTRADIOL 0.075 MG/24HR TD PTTW: 1.0000 | MEDICATED_PATCH | TRANSDERMAL | Status: DC

## 2014-03-03 NOTE — Telephone Encounter (Signed)
Patient thinks the patch Dr.Miller prescribed is the wrong dosage. Patient decided to use them anyway "did not want to box them up and mail them back to her mail order pharmacy.

## 2014-03-03 NOTE — Telephone Encounter (Signed)
Spoke with patient.  She receives her estradiol patches via mail order with 3 month at time fills. Patient feels that this patch is larger and different than before. She has used these patches since last annual exam without issue and is cutting them in half. Patient feels that the current patch is a bigger patch than before and is not as easy to cut as her other patches were. Patient feels she may have been on another brand of patches. Patient states she was on Minivelle patches prior and they were small and easier to cut.  In paper chart, patient is ordered Minivelle 0.075 mg/24 hour on 09/05/12. But then note from 09/12/12 states that patient is concerned regarding cost of [place]. Advised patient that insurance company may also substitute Vivelle as this is a generic. Patient prefers to be on the cheaper medication. Patient is requesting a one month supply of refill to last until next annual with Dr. Sabra Heck 05/2014 as she has 2 months worth left of her current medication.  Spoke with patient and advised I sent one month supply of Vivelle  0.075 mg to her pharmacy and that if she speaks to them about cost and decides she would like Minivelle to call back and new order can be placed.  Routing to provider for final review. Patient agreeable to disposition. Will close encounter

## 2014-03-18 ENCOUNTER — Ambulatory Visit (INDEPENDENT_AMBULATORY_CARE_PROVIDER_SITE_OTHER): Payer: Medicare Other

## 2014-03-18 DIAGNOSIS — Z23 Encounter for immunization: Secondary | ICD-10-CM

## 2014-04-09 ENCOUNTER — Other Ambulatory Visit: Payer: Self-pay | Admitting: Obstetrics & Gynecology

## 2014-04-10 NOTE — Telephone Encounter (Signed)
Last AEX and refill 04/02/13 #90/4R Next appt 05/23/14 MMG 01/13/14 BIRADS1: neg  Rx sent to last until appt 05/2014

## 2014-05-05 ENCOUNTER — Encounter: Payer: Self-pay | Admitting: Internal Medicine

## 2014-05-19 ENCOUNTER — Encounter: Payer: Self-pay | Admitting: Internal Medicine

## 2014-05-19 ENCOUNTER — Ambulatory Visit (INDEPENDENT_AMBULATORY_CARE_PROVIDER_SITE_OTHER): Payer: Medicare Other | Admitting: Internal Medicine

## 2014-05-19 VITALS — BP 120/86 | HR 68 | Temp 98.4°F | Resp 14 | Ht 63.75 in | Wt 137.1 lb

## 2014-05-19 DIAGNOSIS — E038 Other specified hypothyroidism: Secondary | ICD-10-CM

## 2014-05-19 DIAGNOSIS — E559 Vitamin D deficiency, unspecified: Secondary | ICD-10-CM

## 2014-05-19 DIAGNOSIS — E782 Mixed hyperlipidemia: Secondary | ICD-10-CM

## 2014-05-19 NOTE — Patient Instructions (Signed)
Your next office appointment will be determined based upon review of your pending labs. Those instructions will be transmitted to you through My Chart . 

## 2014-05-19 NOTE — Progress Notes (Signed)
Subjective:    Patient ID: Pamela Rich, female    DOB: [birth year], 68 y.o.   MRN: [id number]  HPI She  is here to assess active health issues & conditions.   PMH, FH, & Social history verified & updated.  She is compliant with Low dose ASA ,thyroid supplement as well as a statin. She's had no adverse effects with these.  There is no family history of premature coronary artery disease or stroke. Advanced kestrel testing reveals her LDL goal is less than 105; ideally less than 75.   She is on a heart healthy diet and walks 2 miles per day 2-3 X/ week with no cardio pulmonary symptoms  She is in a support group @ church as she lost her husband earlier this year to esophageal cancer. She is on vitamin D supplementation; her osteopenia is monitored by her gynecologist. She has taken 5 years of bisphosphonate and is on hormonal replacement therapy at this time.  Her GERD has resolved.    Review of Systems   Specifically denied are  chest pain, palpitations, dyspnea, or claudication.  Significant abdominal symptoms, memory deficit, or myalgias not present.  Unexplained weight loss, abdominal pain, significant dyspepsia, dysphagia, melena, rectal bleeding, or persistently small caliber stools are denied.     Objective:   Physical Exam  Gen.: Healthy and well-nourished in appearance. Alert, appropriate and cooperative throughout exam. Appears younger than stated age  Head: Normocephalic without obvious abnormalities  Eyes: No corneal or conjunctival inflammation noted. Pupils equal round reactive to light and accommodation. Extraocular motion intact. Ptosis present, OS > OD. Ears: External  ear exam reveals no significant lesions or deformities. Canals clear .TMs normal. Hearing aids worn bilaterally. Nose: External nasal exam reveals no deformity or inflammation. Nasal mucosa are pink and moist. No lesions or exudates noted.   Mouth: Oral mucosa and oropharynx reveal no lesions or  exudates. Teeth in good repair. Neck: No deformities, masses, or tenderness noted. Range of motion & Thyroid normal*. Lungs: Normal respiratory effort; chest expands symmetrically. Lungs are clear to auscultation without rales, wheezes, or increased work of breathing. Heart: Normal rate and rhythm. Normal S1 and S2. No gallop, click, or rub. S4 ; no murmur. Abdomen: Bowel sounds normal; abdomen soft and nontender. No masses, organomegaly or hernias noted. Genitalia:  as per Gyn                                  Musculoskeletal/extremities: No deformity or scoliosis noted of  the thoracic or lumbar spine.   No clubbing, cyanosis, edema, or significant extremity  deformity noted. Range of motion normal .Tone & strength normal. Hand joints normal  Fingernail health good. Minor knee crepitus Able to lie down & sit up w/o help. Negative SLR bilaterally Vascular: Carotid, radial artery, dorsalis pedis and  posterior tibial pulses are full and equal. No bruits present. Neurologic: Alert and oriented x3. Deep tendon reflexes symmetrical and normal.  Gait normal  .     Skin: Intact without suspicious lesions or rashes. Lymph: No cervical, axillary lymphadenopathy present. Psych: Mood and affect are normal. Normally interactive despite loss of husband.  Assessment & Plan:  See Current Assessment & Plan in Problem List under specific Diagnosis

## 2014-05-19 NOTE — Assessment & Plan Note (Signed)
Lipids, LFTs, TSH  

## 2014-05-19 NOTE — Assessment & Plan Note (Signed)
Vitamin D level 

## 2014-05-19 NOTE — Assessment & Plan Note (Signed)
TSH 

## 2014-05-19 NOTE — Progress Notes (Signed)
Pre visit review using our clinic review tool, if applicable. No additional management support is needed unless otherwise documented below in the visit note. 

## 2014-05-20 ENCOUNTER — Other Ambulatory Visit (INDEPENDENT_AMBULATORY_CARE_PROVIDER_SITE_OTHER): Payer: Medicare Other

## 2014-05-20 DIAGNOSIS — E038 Other specified hypothyroidism: Secondary | ICD-10-CM

## 2014-05-20 DIAGNOSIS — E559 Vitamin D deficiency, unspecified: Secondary | ICD-10-CM

## 2014-05-20 DIAGNOSIS — E782 Mixed hyperlipidemia: Secondary | ICD-10-CM

## 2014-05-20 LAB — LIPID PANEL
CHOLESTEROL: 226 mg/dL — AB (ref 0–200)
HDL: 66.3 mg/dL (ref >39.00)
LDL CALC: 135 mg/dL — AB (ref 0–99)
NonHDL: 159.7
TRIGLYCERIDES: 123 mg/dL (ref 0.0–149.0)
Total CHOL/HDL Ratio: 3
VLDL: 24.6 mg/dL (ref 0.0–40.0)

## 2014-05-20 LAB — HEPATIC FUNCTION PANEL
ALT: 22 U/L (ref 0–35)
AST: 33 U/L (ref 0–37)
Albumin: 4.4 g/dL (ref 3.5–5.2)
Alkaline Phosphatase: 68 U/L (ref 39–117)
BILIRUBIN TOTAL: 0.4 mg/dL (ref 0.2–1.2)
Bilirubin, Direct: 0 mg/dL (ref 0.0–0.3)
Total Protein: 7.5 g/dL (ref 6.0–8.3)

## 2014-05-20 LAB — BASIC METABOLIC PANEL
BUN: 17 mg/dL (ref 6–23)
CO2: 20 mEq/L (ref 19–32)
CREATININE: 1 mg/dL (ref 0.4–1.2)
Calcium: 9.2 mg/dL (ref 8.4–10.5)
Chloride: 116 mEq/L — ABNORMAL HIGH (ref 96–112)
GFR: 62.09 mL/min (ref >60.00)
GLUCOSE: 96 mg/dL (ref 70–99)
Potassium: 4.2 mEq/L (ref 3.5–5.1)
Sodium: 150 mEq/L — ABNORMAL HIGH (ref 135–145)

## 2014-05-20 LAB — TSH: TSH: 3.51 u[IU]/mL (ref 0.35–4.50)

## 2014-05-20 LAB — VITAMIN D 25 HYDROXY (VIT D DEFICIENCY, FRACTURES): VITD: 47.77 ng/mL (ref 30.00–100.00)

## 2014-05-22 ENCOUNTER — Encounter: Payer: Self-pay | Admitting: Obstetrics & Gynecology

## 2014-05-23 ENCOUNTER — Encounter: Payer: Self-pay | Admitting: Obstetrics & Gynecology

## 2014-05-23 ENCOUNTER — Ambulatory Visit (INDEPENDENT_AMBULATORY_CARE_PROVIDER_SITE_OTHER): Payer: Medicare Other | Admitting: Obstetrics & Gynecology

## 2014-05-23 VITALS — BP 126/64 | HR 64 | Resp 16 | Ht 63.25 in | Wt 137.6 lb

## 2014-05-23 DIAGNOSIS — Z01419 Encounter for gynecological examination (general) (routine) without abnormal findings: Secondary | ICD-10-CM

## 2014-05-23 MED ORDER — PROGESTERONE MICRONIZED 100 MG PO CAPS: 100.0000 mg | ORAL_CAPSULE | Freq: Every day | ORAL | Status: DC

## 2014-05-23 MED ORDER — ESTRADIOL 0.075 MG/24HR TD PTTW: 1.0000 | MEDICATED_PATCH | TRANSDERMAL | Status: DC

## 2014-05-23 NOTE — Progress Notes (Signed)
68 y.o. G4P3 Widowed CaucasianF here for annual exam.  Husband died in 16-Dec-2022.  Pt is appropriate.  In a grief share class at her church.  Goes to [employer].  Something good is her daughter is expecting twin girls--going to 34 on next birthday.    No vaginal bleeding.  No ER visits.    PCP:  Dr. Linna Darner   Patient's last menstrual period was 07/04/2000.          Sexually active: No.  The current method of family planning is tubal ligation.    Exercising: Yes.    walking Smoker:  Former smoker  Health Maintenance: Pap:  04/02/13 WNL History of abnormal Pap:  no MMG:  01/13/14-normal Colonoscopy:  6/14-repeat in 10 years BMD:   2013, -1.2 no change from 2011 TDaP:  2007 Screening Labs: PCP, Hb today: PCP, Urine today: PCP   reports that she quit smoking about 39 years ago. She has never used smokeless tobacco. She reports that she does not drink alcohol or use illicit drugs.  Past Medical History  Diagnosis Date  . Hypothyroidism   . Hyperlipidemia   . Migraines   . Osteopenia   . Vitamin D deficiency   . Heart murmur   . Allergy     SEASONAL  . Fibroid     no surgery    Past Surgical History  Procedure Laterality Date  . Septoplasty    . Polypectomy      uterine  . Tubal ligation    . Lumbar fusion      Dr Hal Neer  . Colonoscopy  2014    negative , Dr Ardis Hughs  . Cortisone injections      in left hand    Current Outpatient Prescriptions  Medication Sig Dispense Refill  . Ascorbic Acid (VITAMIN C) 1000 MG tablet Take 1,000 mg by mouth daily.      . butalbital-aspirin-caffeine (FIORINAL) 50-325-40 MG per capsule TAKE ONE CAPSULE BY MOUTH EVERY 6 HOURS AS NEEDED FOR HEADACHE 30 capsule 0  . calcium carbonate (TUMS - DOSED IN MG ELEMENTAL CALCIUM) 500 MG chewable tablet Chew 1 tablet by mouth daily.      . Cholecalciferol (VITAMIN D3) 1000 UNITS CAPS Take by mouth daily.      Marland Kitchen estradiol (VIVELLE-DOT) 0.075 MG/24HR Place 1 patch onto the skin 2 (two) times a  week. 8 patch 0  . folic acid (FOLVITE) 308 MCG tablet Take 400 mcg by mouth daily.      Marland Kitchen levothyroxine (SYNTHROID, LEVOTHROID) 112 MCG tablet Take 1 tablet (112 mcg total) by mouth daily before breakfast. 90 tablet 3  . Multiple Vitamin (MULTIVITAMIN PO) Take by mouth daily.      . pravastatin (PRAVACHOL) 20 MG tablet TAKE 1 TABLET DAILY 90 tablet 3  . progesterone (PROMETRIUM) 100 MG capsule TAKE 1 CAPSULE DAILY 90 capsule 0   No current facility-administered medications for this visit.    Family History  Problem Relation Age of Onset  . Hypertension Mother   . Heart disease Mother     CHF; AS  . Osteoporosis Mother   . Heart attack Mother 40  . Diabetes Father   . Stroke Father     onset in 64s  . Cancer Brother     malignant brain tumor  . Colon cancer Neg Hx   . Esophageal cancer Neg Hx   . Rectal cancer Neg Hx   . Stomach cancer Neg Hx     ROS:  Pertinent items are noted in HPI.  Otherwise, a comprehensive ROS was negative.  Exam:   BP 126/64 mmHg  Pulse 64  Resp 16  Ht 5' 3.25" (1.607 m)  Wt 137 lb 9.6 oz (62.415 kg)  BMI 24.17 kg/m2  LMP 07/04/2000   Height: 5' 3.25" (160.7 cm)  Ht Readings from Last 3 Encounters:  05/23/14 5' 3.25" (1.607 m)  05/19/14 5' 3.75" (1.619 m)  01/27/14 5\' 3"  (1.6 m)    General appearance: alert, cooperative and appears stated age Head: Normocephalic, without obvious abnormality, atraumatic Neck: no adenopathy, supple, symmetrical, trachea midline and thyroid normal to inspection and palpation Lungs: clear to auscultation bilaterally Breasts: normal appearance, no masses or tenderness Heart: regular rate and rhythm Abdomen: soft, non-tender; bowel sounds normal; no masses,  no organomegaly Extremities: extremities normal, atraumatic, no cyanosis or edema Skin: Skin color, texture, turgor normal. No rashes or lesions Lymph nodes: Cervical, supraclavicular, and axillary nodes normal. No abnormal inguinal nodes  palpated Neurologic: Grossly normal   Pelvic: External genitalia:  no lesions              Urethra:  normal appearing urethra with no masses, tenderness or lesions              Bartholins and Skenes: normal                 Vagina: normal appearing vagina with normal color and discharge, no lesions              Cervix: no lesions              Pap taken: No. Bimanual Exam:  Uterus:  normal size, contour, position, consistency, mobility, non-tender              Adnexa: normal adnexa and no mass, fullness, tenderness               Rectovaginal: Confirms               Anus:  normal sphincter tone, no lesions  A:  Well Woman with normal exam PMP, on HRT Hypothyroidism Elevated lipids Osteopenia Appropriate grief reaction with death of husband  P: Mammogram yearly pap smear 2014.  No pap today. Vivelle dot 0.075mg  patches 1/2 patch twice weekly and Prometrium 100mg  daily. 90 day supply with 1year RF to mail order pharmacy Labs with Dr. Linna Darner return annually or prn  An After Visit Summary was printed and given to the patient.

## 2014-05-25 ENCOUNTER — Other Ambulatory Visit: Payer: Self-pay | Admitting: Internal Medicine

## 2014-05-25 DIAGNOSIS — E870 Hyperosmolality and hypernatremia: Secondary | ICD-10-CM

## 2014-05-25 DIAGNOSIS — E039 Hypothyroidism, unspecified: Secondary | ICD-10-CM

## 2014-05-25 DIAGNOSIS — E782 Mixed hyperlipidemia: Secondary | ICD-10-CM

## 2014-10-16 ENCOUNTER — Telehealth: Payer: Self-pay | Admitting: Obstetrics & Gynecology

## 2014-10-16 ENCOUNTER — Other Ambulatory Visit: Payer: Self-pay | Admitting: Obstetrics & Gynecology

## 2014-10-16 MED ORDER — PROGESTERONE MICRONIZED 100 MG PO CAPS: 100.0000 mg | ORAL_CAPSULE | Freq: Every day | ORAL | Status: DC

## 2014-10-16 NOTE — Telephone Encounter (Signed)
Patient calling requesting advice on her progesterone as it was denied by the pharmacy.

## 2014-10-16 NOTE — Telephone Encounter (Signed)
RX for one year sent on 05/23/14 at [facility].  RX denied.

## 2014-10-16 NOTE — Telephone Encounter (Signed)
Spoke with patient. Rx was declined by CMA. [facility] states that [facility] not have refills available, despite Dr. Sabra Heck sending in year supply in 05/2014. [facility] to [facility]. Patient very thankful.  Routing to provider for final review. Patient agreeable to disposition. Will close encounter

## 2014-10-28 ENCOUNTER — Other Ambulatory Visit: Payer: Self-pay | Admitting: Obstetrics & Gynecology

## 2014-10-28 NOTE — Telephone Encounter (Signed)
I will need paper chart to see if I have ever given her this or can you check with pharmacy about who was last prescriber.   I do not often write this medication.  Rx denied for now.

## 2014-10-28 NOTE — Telephone Encounter (Signed)
Medication refill request: Fiorinal  Last AEX:  05/23/14 SM Next AEX: 07/27/15 SM Last MMG (if hormonal medication request): 01/13/14 BIRADS1:Neg Refill authorized: 02/18/14 #30/0R. Today please advise.

## 2014-10-29 NOTE — Telephone Encounter (Signed)
Paper chart in your basket.   [facility] refill 02/18/14 #30caps / 0 Refills by Dr. Sabra Heck.   Called patient. She is ok to wait until Monday 11/03/14. She gets Rx couple times a year.   Dr. Sabra Heck please advise.

## 2014-10-29 NOTE — Addendum Note (Signed)
Addended by: Elroy Channel on: 10/29/2014 10:03 AM   Modules accepted: Orders

## 2014-10-31 ENCOUNTER — Telehealth: Payer: Self-pay | Admitting: Internal Medicine

## 2014-10-31 NOTE — Telephone Encounter (Signed)
Patient would like to know if she can get the pneumonia vac?

## 2014-10-31 NOTE — Telephone Encounter (Signed)
Patient can be scheduled for a nurse visit

## 2014-10-31 NOTE — Telephone Encounter (Signed)
If patients need 23 or 13 you don't have to ask me Only the Shingles has restrictions, mainly immunocompromise

## 2014-11-03 MED ORDER — BUTALBITAL-ASPIRIN-CAFFEINE 50-325-40 MG PO CAPS: 1.0000 | ORAL_CAPSULE | Freq: Four times a day (QID) | ORAL | Status: DC | PRN

## 2014-11-03 NOTE — Telephone Encounter (Addendum)
Rx faxed to [facility] [address]

## 2014-11-04 ENCOUNTER — Ambulatory Visit (INDEPENDENT_AMBULATORY_CARE_PROVIDER_SITE_OTHER): Payer: Medicare Other | Admitting: *Deleted

## 2014-11-04 ENCOUNTER — Telehealth: Payer: Self-pay | Admitting: Internal Medicine

## 2014-11-04 DIAGNOSIS — E870 Hyperosmolality and hypernatremia: Secondary | ICD-10-CM

## 2014-11-04 DIAGNOSIS — E039 Hypothyroidism, unspecified: Secondary | ICD-10-CM

## 2014-11-04 DIAGNOSIS — Z23 Encounter for immunization: Secondary | ICD-10-CM

## 2014-11-04 DIAGNOSIS — E782 Mixed hyperlipidemia: Secondary | ICD-10-CM

## 2014-11-04 NOTE — Telephone Encounter (Signed)
done

## 2014-11-04 NOTE — Telephone Encounter (Signed)
Patient was told on last visit to get some lab work done in 3 months to see when she needed to come back in. The labs that were ordered have expired. Please reorder labs so she may get them done. Please advise.

## 2014-12-08 ENCOUNTER — Other Ambulatory Visit: Payer: Self-pay | Admitting: Internal Medicine

## 2014-12-08 ENCOUNTER — Other Ambulatory Visit: Payer: Self-pay

## 2014-12-08 DIAGNOSIS — Z1231 Encounter for screening mammogram for malignant neoplasm of breast: Secondary | ICD-10-CM

## 2015-01-16 ENCOUNTER — Ambulatory Visit: Payer: BC Managed Care – PPO

## 2015-01-20 ENCOUNTER — Ambulatory Visit
Admission: RE | Admit: 2015-01-20 | Discharge: 2015-01-20 | Disposition: A | Discharge status: Home / Self Care | Payer: Medicare Other | Source: Ambulatory Visit

## 2015-01-20 DIAGNOSIS — Z1231 Encounter for screening mammogram for malignant neoplasm of breast: Secondary | ICD-10-CM

## 2015-03-02 ENCOUNTER — Other Ambulatory Visit: Payer: Self-pay | Admitting: Internal Medicine

## 2015-03-20 ENCOUNTER — Other Ambulatory Visit: Payer: Self-pay | Admitting: Obstetrics & Gynecology

## 2015-03-20 DIAGNOSIS — R519 Headache, unspecified: Secondary | ICD-10-CM

## 2015-03-20 DIAGNOSIS — R51 Headache: Principal | ICD-10-CM

## 2015-03-20 NOTE — Telephone Encounter (Signed)
Can you please call pt.  This was refilled in May.  Has she used 30 of these in the last four months.  That is more than I would expect and is more than she typically uses.  May need to see neurologist.

## 2015-03-20 NOTE — Telephone Encounter (Signed)
Medication refill request: Fiorinal  Last AEX:  05/23/14 SM Next AEX: 07/28/15 SM Last MMG (if hormonal medication request): 01/20/15 BIRADS1:Neg Refill authorized: 11/03/14 #30/0R. Today please advise.

## 2015-03-23 NOTE — Telephone Encounter (Signed)
Patient notified Rx sent to pharmacy. Patient notified referral for neurology placed.

## 2015-03-23 NOTE — Addendum Note (Signed)
Addended by: Megan Salon on: 03/23/2015 02:06 PM   Modules accepted: Orders

## 2015-03-23 NOTE — Telephone Encounter (Signed)
Patient states she has one pill left.  Informed patient she may need neurologist appt and that I will call her back with Dr. Ammie Ferrier recommendations.

## 2015-03-24 ENCOUNTER — Ambulatory Visit (INDEPENDENT_AMBULATORY_CARE_PROVIDER_SITE_OTHER): Payer: Medicare Other

## 2015-03-24 DIAGNOSIS — Z23 Encounter for immunization: Secondary | ICD-10-CM

## 2015-04-20 ENCOUNTER — Ambulatory Visit: Payer: BC Managed Care – PPO | Admitting: Neurology

## 2015-04-30 ENCOUNTER — Telehealth: Payer: Self-pay

## 2015-04-30 NOTE — Telephone Encounter (Signed)
Fup AWV; Call back and agreed to come in for AWV on 11/2 at 1pm and scheduled.

## 2015-04-30 NOTE — Telephone Encounter (Signed)
Patient called to educate on Medicare Wellness apt. LVM for the patient to call back to educate and schedule for wellness visit.   

## 2015-05-06 ENCOUNTER — Ambulatory Visit (INDEPENDENT_AMBULATORY_CARE_PROVIDER_SITE_OTHER): Payer: Medicare Other

## 2015-05-06 VITALS — BP 130/80 | Ht 63.0 in | Wt 139.2 lb

## 2015-05-06 DIAGNOSIS — Z0000 Encounter for general adult medical examination without abnormal findings: Secondary | ICD-10-CM | POA: Diagnosis not present

## 2015-05-06 DIAGNOSIS — M8580 Other specified disorders of bone density and structure, unspecified site: Secondary | ICD-10-CM

## 2015-05-06 NOTE — Patient Instructions (Addendum)
Pamela Rich , Thank you for taking time to come for your Medicare Wellness Visit. I appreciate your ongoing commitment to your health goals. Please review the following plan we discussed and let me know if I can assist you in the future.   Will take Dexa scan;   Will see Dr. Linna Darner for back and shoulder discomfort;  Continue to walk 3 times a week x 30 min 2 days; weights as discussed     These are the goals we discussed: Goals    . patient     More strength training with stretch bands, low weights, go slow and stop with pain  Adapting to change is ongoing; will adapt some stress reduction;   Vacation an little more; Go on vacation x 2 weeks;        This is a list of the screening recommended for you and due dates:  Health Maintenance  Topic Date Due  .  Hepatitis C: One time screening is recommended by Center for Disease Control  (CDC) for  adults born from [birth year] through 1965.   [birth year]  . Flu Shot  02/02/2016  . Mammogram  01/19/2017  . Tetanus Vaccine  07/26/2022  . Colon Cancer Screening  12/26/2022  . DEXA scan (bone density measurement)  Completed  . Shingles Vaccine  Completed  . Pneumonia vaccines  Completed     Health Maintenance, Female Adopting a healthy lifestyle and getting preventive care can go a long way to promote health and wellness. Talk with your health care provider about what schedule of regular examinations is right for you. This is a good chance for you to check in with your provider about disease prevention and staying healthy. In between checkups, there are plenty of things you can do on your own. Experts have done a lot of research about which lifestyle changes and preventive measures are most likely to keep you healthy. Ask your health care provider for more information. WEIGHT AND DIET  Eat a healthy diet  Be sure to include plenty of vegetables, fruits, low-fat dairy products, and lean protein.  Do not eat a lot of foods high in solid  fats, added sugars, or salt.  Get regular exercise. This is one of the most important things you can do for your health.  Most adults should exercise for at least 150 minutes each week. The exercise should increase your heart rate and make you sweat (moderate-intensity exercise).  Most adults should also do strengthening exercises at least twice a week. This is in addition to the moderate-intensity exercise.  Maintain a healthy weight  Body mass index (BMI) is a measurement that can be used to identify possible weight problems. It estimates body fat based on height and weight. Your health care provider can help determine your BMI and help you achieve or maintain a healthy weight.  For females 5 years of age and older:   A BMI below 18.5 is considered underweight.  A BMI of 18.5 to 24.9 is normal.  A BMI of 25 to 29.9 is considered overweight.  A BMI of 30 and above is considered obese.  Watch levels of cholesterol and blood lipids  You should start having your blood tested for lipids and cholesterol at 69 years of age, then have this test every 5 years.  You may need to have your cholesterol levels checked more often if:  Your lipid or cholesterol levels are high.  You are older than 69 years of age.  You  are at high risk for heart disease.  CANCER SCREENING   Lung Cancer  Lung cancer screening is recommended for adults 69-73 years old who are at high risk for lung cancer because of a history of smoking.  A yearly low-dose CT scan of the lungs is recommended for people who:  Currently smoke.  Have quit within the past 15 years.  Have at least a 30-pack-year history of smoking. A pack year is smoking an average of one pack of cigarettes a day for 1 year.  Yearly screening should continue until it has been 15 years since you quit.  Yearly screening should stop if you develop a health problem that would prevent you from having lung cancer treatment.  Breast  Cancer  Practice breast self-awareness. This means understanding how your breasts normally appear and feel.  It also means doing regular breast self-exams. Let your health care provider know about any changes, no matter how small.  If you are in your 69s or 30s, you should have a clinical breast exam (CBE) by a health care provider every 1-3 years as part of a regular health exam.  If you are 63 or older, have a CBE every year. Also consider having a breast X-ray (mammogram) every year.  If you have a family history of breast cancer, talk to your health care provider about genetic screening.  If you are at high risk for breast cancer, talk to your health care provider about having an MRI and a mammogram every year.  Breast cancer gene (BRCA) assessment is recommended for women who have family members with BRCA-related cancers. BRCA-related cancers include:  Breast.  Ovarian.  Tubal.  Peritoneal cancers.  Results of the assessment will determine the need for genetic counseling and BRCA1 and BRCA2 testing. Cervical Cancer Your health care provider may recommend that you be screened regularly for cancer of the pelvic organs (ovaries, uterus, and vagina). This screening involves a pelvic examination, including checking for microscopic changes to the surface of your cervix (Pap test). You may be encouraged to have this screening done every 3 years, beginning at age 69.  For women ages 69-65, health care providers may recommend pelvic exams and Pap testing every 3 years, or they may recommend the Pap and pelvic exam, combined with testing for human papilloma virus (HPV), every 5 years. Some types of HPV increase your risk of cervical cancer. Testing for HPV may also be done on women of any age with unclear Pap test results.  Other health care providers may not recommend any screening for nonpregnant women who are considered low risk for pelvic cancer and who do not have symptoms. Ask your  health care provider if a screening pelvic exam is right for you.  If you have had past treatment for cervical cancer or a condition that could lead to cancer, you need Pap tests and screening for cancer for at least 20 years after your treatment. If Pap tests have been discontinued, your risk factors (such as having a new sexual partner) need to be reassessed to determine if screening should resume. Some women have medical problems that increase the chance of getting cervical cancer. In these cases, your health care provider may recommend more frequent screening and Pap tests. Colorectal Cancer  This type of cancer can be detected and often prevented.  Routine colorectal cancer screening usually begins at 69 years of age and continues through 69 years of age.  Your health care provider may recommend screening at  an earlier age if you have risk factors for colon cancer.  Your health care provider may also recommend using home test kits to check for hidden blood in the stool.  A small camera at the end of a tube can be used to examine your colon directly (sigmoidoscopy or colonoscopy). This is done to check for the earliest forms of colorectal cancer.  Routine screening usually begins at age 19.  Direct examination of the colon should be repeated every 5-10 years through 69 years of age. However, you may need to be screened more often if early forms of precancerous polyps or small growths are found. Skin Cancer  Check your skin from head to toe regularly.  Tell your health care provider about any new moles or changes in moles, especially if there is a change in a mole's shape or color.  Also tell your health care provider if you have a mole that is larger than the size of a pencil eraser.  Always use sunscreen. Apply sunscreen liberally and repeatedly throughout the day.  Protect yourself by wearing long sleeves, pants, a wide-brimmed hat, and sunglasses whenever you are outside. HEART  DISEASE, DIABETES, AND HIGH BLOOD PRESSURE   High blood pressure causes heart disease and increases the risk of stroke. High blood pressure is more likely to develop in:  People who have blood pressure in the high end of the normal range (130-139/85-89 mm Hg).  People who are overweight or obese.  People who are African American.  If you are 49-4 years of age, have your blood pressure checked every 3-5 years. If you are 22 years of age or older, have your blood pressure checked every year. You should have your blood pressure measured twice--once when you are at a hospital or clinic, and once when you are not at a hospital or clinic. Record the average of the two measurements. To check your blood pressure when you are not at a hospital or clinic, you can use:  An automated blood pressure machine at a pharmacy.  A home blood pressure monitor.  If you are between 78 years and 75 years old, ask your health care provider if you should take aspirin to prevent strokes.  Have regular diabetes screenings. This involves taking a blood sample to check your fasting blood sugar level.  If you are at a normal weight and have a low risk for diabetes, have this test once every three years after 69 years of age.  If you are overweight and have a high risk for diabetes, consider being tested at a younger age or more often. PREVENTING INFECTION  Hepatitis B  If you have a higher risk for hepatitis B, you should be screened for this virus. You are considered at high risk for hepatitis B if:  You were born in a country where hepatitis B is common. Ask your health care provider which countries are considered high risk.  Your parents were born in a high-risk country, and you have not been immunized against hepatitis B (hepatitis B vaccine).  You have HIV or AIDS.  You use needles to inject street drugs.  You live with someone who has hepatitis B.  You have had sex with someone who has hepatitis  B.  You get hemodialysis treatment.  You take certain medicines for conditions, including cancer, organ transplantation, and autoimmune conditions. Hepatitis C  Blood testing is recommended for:  Everyone born from [birth year] through 1965.  Anyone with known risk factors for hepatitis  C. Sexually transmitted infections (STIs)  You should be screened for sexually transmitted infections (STIs) including gonorrhea and chlamydia if:  You are sexually active and are younger than 69 years of age.  You are older than 70 years of age and your health care provider tells you that you are at risk for this type of infection.  Your sexual activity has changed since you were last screened and you are at an increased risk for chlamydia or gonorrhea. Ask your health care provider if you are at risk.  If you do not have HIV, but are at risk, it may be recommended that you take a prescription medicine daily to prevent HIV infection. This is called pre-exposure prophylaxis (PrEP). You are considered at risk if:  You are sexually active and do not regularly use condoms or know the HIV status of your partner(s).  You take drugs by injection.  You are sexually active with a partner who has HIV. Talk with your health care provider about whether you are at high risk of being infected with HIV. If you choose to begin PrEP, you should first be tested for HIV. You should then be tested every 3 months for as long as you are taking PrEP.  PREGNANCY   If you are premenopausal and you may become pregnant, ask your health care provider about preconception counseling.  If you may become pregnant, take 400 to 800 micrograms (mcg) of folic acid every day.  If you want to prevent pregnancy, talk to your health care provider about birth control (contraception). OSTEOPOROSIS AND MENOPAUSE   Osteoporosis is a disease in which the bones lose minerals and strength with aging. This can result in serious bone fractures. Your  risk for osteoporosis can be identified using a bone density scan.  If you are 33 years of age or older, or if you are at risk for osteoporosis and fractures, ask your health care provider if you should be screened.  Ask your health care provider whether you should take a calcium or vitamin D supplement to lower your risk for osteoporosis.  Menopause may have certain physical symptoms and risks.  Hormone replacement therapy may reduce some of these symptoms and risks. Talk to your health care provider about whether hormone replacement therapy is right for you.  HOME CARE INSTRUCTIONS   Schedule regular health, dental, and eye exams.  Stay current with your immunizations.   Do not use any tobacco products including cigarettes, chewing tobacco, or electronic cigarettes.  If you are pregnant, do not drink alcohol.  If you are breastfeeding, limit how much and how often you drink alcohol.  Limit alcohol intake to no more than 1 drink per day for nonpregnant women. One drink equals 12 ounces of beer, 5 ounces of wine, or 1 ounces of hard liquor.  Do not use street drugs.  Do not share needles.  Ask your health care provider for help if you need support or information about quitting drugs.  Tell your health care provider if you often feel depressed.  Tell your health care provider if you have ever been abused or do not feel safe at home.   This information is not intended to replace advice given to you by your health care provider. Make sure you discuss any questions you have with your health care provider.   Document Released: 01/03/2011 Document Revised: 07/11/2014 Document Reviewed: 05/22/2013 Elsevier Interactive Patient Education 2016 [employer].   Osteoporosis Osteoporosis is the thinning and loss of  density in the bones. Osteoporosis makes the bones more brittle, fragile, and likely to break (fracture). Over time, osteoporosis can cause the bones to become so weak that  they fracture after a simple fall. The bones most likely to fracture are the bones in the hip, wrist, and spine. CAUSES  The exact cause is not known. RISK FACTORS Anyone can develop osteoporosis. You may be at greater risk if you have a family history of the condition or have poor nutrition. You may also have a higher risk if you are:   Female.   54 years old or older.  A smoker.  Not physically active.   White or Asian.  Slender. SIGNS AND SYMPTOMS  A fracture might be the first sign of the disease, especially if it results from a fall or injury that would not usually cause a bone to break. Other signs and symptoms include:   Low back and neck pain.  Stooped posture.  Height loss. DIAGNOSIS  To make a diagnosis, your health care provider may:  Take a medical history.  Perform a physical exam.  Order tests, such as:  A bone mineral density test.  A dual-energy X-ray absorptiometry test. TREATMENT  The goal of osteoporosis treatment is to strengthen your bones to reduce your risk of a fracture. Treatment may involve:  Making lifestyle changes, such as:  Eating a diet rich in calcium.  Doing weight-bearing and muscle-strengthening exercises.  Stopping tobacco use.  Limiting alcohol intake.  Taking medicine to slow the process of bone loss or to increase bone density.  Monitoring your levels of calcium and vitamin D. HOME CARE INSTRUCTIONS  Include calcium and vitamin D in your diet. Calcium is important for bone health, and vitamin D helps the body absorb calcium.  Perform weight-bearing and muscle-strengthening exercises as directed by your health care provider.  Do not use any tobacco products, including cigarettes, chewing tobacco, and electronic cigarettes. If you need help quitting, ask your health care provider.  Limit your alcohol intake.  Take medicines only as directed by your health care provider.  Keep all follow-up visits as directed by  your health care provider. This is important.  Take precautions at home to lower your risk of falling, such as:  Keeping rooms well lit and clutter free.  Installing safety rails on stairs.  Using rubber mats in the bathroom and other areas that are often wet or slippery. SEEK IMMEDIATE MEDICAL CARE IF:  You fall or injure yourself.    This information is not intended to replace advice given to you by your health care provider. Make sure you discuss any questions you have with your health care provider.   Document Released: 03/30/2005 Document Revised: 07/11/2014 Document Reviewed: 11/28/2013 Elsevier Interactive Patient Education 2016 [employer]  Back Pain, Adult Back pain is very common in adults.The cause of back pain is rarely dangerous and the pain often gets better over time.The cause of your back pain may not be known. Some common causes of back pain include:  Strain of the muscles or ligaments supporting the spine.  Wear and tear (degeneration) of the spinal disks.  Arthritis.  Direct injury to the back. For many people, back pain may return. Since back pain is rarely dangerous, most people can learn to manage this condition on their own. HOME CARE INSTRUCTIONS Watch your back pain for any changes. The following actions may help to lessen any discomfort you are feeling:  Remain active. It is stressful on your  back to sit or stand in one place for long periods of time. Do not sit, drive, or stand in one place for more than 30 minutes at a time. Take short walks on even surfaces as soon as you are able.Try to increase the length of time you walk each day.  Exercise regularly as directed by your health care provider. Exercise helps your back heal faster. It also helps avoid future injury by keeping your muscles strong and flexible.  Do not stay in bed.Resting more than 1-2 days can delay your recovery.  Pay attention to your body when you bend and lift. The most  comfortable positions are those that put less stress on your recovering back. Always use proper lifting techniques, including:  Bending your knees.  Keeping the load close to your body.  Avoiding twisting.  Find a comfortable position to sleep. Use a firm mattress and lie on your side with your knees slightly bent. If you lie on your back, put a pillow under your knees.  Avoid feeling anxious or stressed.Stress increases muscle tension and can worsen back pain.It is important to recognize when you are anxious or stressed and learn ways to manage it, such as with exercise.  Take medicines only as directed by your health care provider. Over-the-counter medicines to reduce pain and inflammation are often the most helpful.Your health care provider may prescribe muscle relaxant drugs.These medicines help dull your pain so you can more quickly return to your normal activities and healthy exercise.  Apply ice to the injured area:  Put ice in a plastic bag.  Place a towel between your skin and the bag.  Leave the ice on for 20 minutes, 2-3 times a day for the first 2-3 days. After that, ice and heat may be alternated to reduce pain and spasms.  Maintain a healthy weight. Excess weight puts extra stress on your back and makes it difficult to maintain good posture. SEEK MEDICAL CARE IF:  You have pain that is not relieved with rest or medicine.  You have increasing pain going down into the legs or buttocks.  You have pain that does not improve in one week.  You have night pain.  You lose weight.  You have a fever or chills. SEEK IMMEDIATE MEDICAL CARE IF:   You develop new bowel or bladder control problems.  You have unusual weakness or numbness in your arms or legs.  You develop nausea or vomiting.  You develop abdominal pain.  You feel faint.   This information is not intended to replace advice given to you by your health care provider. Make sure you discuss any questions  you have with your health care provider.   Document Released: 06/20/2005 Document Revised: 07/11/2014 Document Reviewed: 10/22/2013 Elsevier Interactive Patient Education [employer].

## 2015-05-06 NOTE — Progress Notes (Signed)
Medical screening examination/treatment/procedure(s) were performed by non-physician practitioner and as supervising physician I was immediately available for consultation/collaboration. I agree with above. Kebra Lowrimore, MD   

## 2015-05-06 NOTE — Progress Notes (Signed)
Subjective:   Pamela Rich is a 69 y.o. female who presents for Medicare Annual (Subsequent) preventive examination.  Review of Systems:   Cardiac Risk Factors include: advanced age (>7men, >74 women);dyslipidemia;family history of premature cardiovascular disease HRA assessment completed during visit; [place] The Patient was informed that this wellness visit is to identify risk and educate on how to reduce risk for increase disease through lifestyle changes.   ROS deferred to CPE exam with physician  Describes health as good;   Medical issues   C/o of pain around and under scapula/ referred to Dr. Linna Darner to determine etiology and then may have massage fup if stress related.  Migraines is mixed;  Has h/a from  Sinus; h/a tension h/a; allergic to mold; goes to [place] allergy; multiple  flare up this year; Went to ENT; stated it was her throat; and thinks the night air is bad for her sinuses;  was told she had bronchitis    Hyperlipidemia; levels to be drawn year; HDL 66 last year Vit d Deficiency; discussed and taking D3 2000 u  BMI: 22.4  Diet; diary in diet; Lost 20 lbs 69 yo;  Breakfast; meat 3 x per day; oatmeal 3 times a week; lunch; fruit and cottage cheese or salad; goes out in the evening; [employer] bar;  Rainbow trout; salmon; vegetables; eat healthy; preplans food when she goes out to eat. Goes to the farmers market; eats a lot of fruit  Limited sweets; went to the [place] in [place] but learned good eating habits   Exercise; walks 2 miles at the park  3 times a week; this relaxes her    Psychosocial spouse died in 2013/07/11; 10 grandchildren; children 3; no one lives in town; has strong support system; Sings in choir; very outgoing    SAFETY plans to age in current home; 2700 square feet; one level with one bonus room upstairs;  Safety reviewed for the home; grab bars installed in bathroom;  clear paths through the home,  railing as needed;  bathroom safety; community safety; smoke detectors and firearms safety as well as sun protection and uses sunscreen  Driving accidents (no) and seatbelt ( yes)    Stressors; 1-5 about 3; states she has all the housework; etc to do since spouse expired. Has learned to pace herself;   Medication review/ no issues  Fall assessment/  No falls Gait assessment/ no issues Mobilization and Functional losses in the last year. none   Urinary or fecal incontinence reviewed; no   Counseling: Hep C; educated Colonoscopy; 12/25/2012- repeat in 10 years EKG: 03/14/2013 Hearing: has hearing aids Dexa: 03/12/2012 spine -0.5; left femoral neck; -1.2/  Took fosamax; not sure of length of time; Discussed weight bearing;  Does back exercises; discussed some strength; discussed stretch ribbons  Will repeat dexa and order; will call to schedule Mammagram 01/20/2015 No mammographic evidence of malignancy. Ophthalmology exam; had one this year; 11-Jul-2022; Neg for glaucoma Immunizations Due None due  Current Care Team reviewed and updated      Objective:     Vitals: BP 130/80 mmHg  Ht 5\' 3"  (1.6 m)  Wt 139 lb 4 oz (63.163 kg)  BMI 24.67 kg/m2  LMP 07/11/2000  Tobacco History  Smoking status  . Former Smoker -- 0.30 packs/day for 40 years  . Types: Cigarettes  . Quit date: July 11, 1974  Smokeless tobacco  . Never Used    Comment: smoked 1965-1976 , < 3 cig/ day  Counseling given: Yes   Past Medical History  Diagnosis Date  . Hypothyroidism   . Hyperlipidemia   . Migraines   . Osteopenia   . Vitamin D deficiency   . Heart murmur   . Allergy     SEASONAL  . Fibroid     no surgery   Past Surgical History  Procedure Laterality Date  . Septoplasty    . Polypectomy      uterine  . Tubal ligation    . Lumbar fusion      Dr Hal Neer  . Colonoscopy  2014    negative , Dr Ardis Hughs  . Cortisone injections      in left hand   Family History  Problem Relation Age of Onset  .  Hypertension Mother   . Heart disease Mother     CHF; AS  . Osteoporosis Mother   . Heart attack Mother 83  . Diabetes Father   . Stroke Father     onset in 15s  . Cancer Brother     malignant brain tumor  . Colon cancer Neg Hx   . Esophageal cancer Neg Hx   . Rectal cancer Neg Hx   . Stomach cancer Neg Hx    History  Sexual Activity  . Sexual Activity: No    Comment: BTL    Outpatient Encounter Prescriptions as of 05/06/2015  Medication Sig  . Ascorbic Acid (VITAMIN C) 1000 MG tablet Take 1,000 mg by mouth daily.    . butalbital-aspirin-caffeine (FIORINAL) 50-325-40 MG per capsule TAKE ONE CAPSULE BY MOUTH EVERY 6 HOURS AS NEEDED FOR HEADACHE  . Cholecalciferol (VITAMIN D3) 1000 UNITS CAPS Take by mouth daily.    Marland Kitchen estradiol (VIVELLE-DOT) 0.075 MG/24HR Place 1 patch onto the skin 2 (two) times a week.  . folic acid (FOLVITE) 607 MCG tablet Take 400 mcg by mouth daily.    Marland Kitchen levothyroxine (SYNTHROID, LEVOTHROID) 112 MCG tablet TAKE 1 TABLET DAILY BEFORE BREAKFAST  . Multiple Vitamin (MULTIVITAMIN PO) Take by mouth daily.    . pravastatin (PRAVACHOL) 20 MG tablet TAKE 1 TABLET DAILY  . progesterone (PROMETRIUM) 100 MG capsule Take 1 capsule (100 mg total) by mouth daily.  . calcium carbonate (TUMS - DOSED IN MG ELEMENTAL CALCIUM) 500 MG chewable tablet Chew 1 tablet by mouth daily.     No facility-administered encounter medications on file as of 05/06/2015.    Activities of Daily Living In your present state of health, do you have any difficulty performing the following activities: 05/06/2015  Hearing? Y  Vision? N  Difficulty concentrating or making decisions? N  Walking or climbing stairs? N  Dressing or bathing? N  Doing errands, shopping? N  Preparing Food and eating ? N  Using the Toilet? N  In the past six months, have you accidently leaked urine? N  Do you have problems with loss of bowel control? N  Managing your Medications? N  Managing your Finances? N    Housekeeping or managing your Housekeeping? N    Patient Care Team: Hendricks Limes, MD as PCP - General    Assessment:    Assessment   Patient presents for yearly preventative medicine examination. Medicare questionnaire screening were completed, i.e. Functional; fall risk; depression, memory loss and hearing. Wears hearing aids; educated on adding some strength building exercises to tolerance   All immunizations and health maintenance protocols were reviewed and took a flu shot today  Education provided for laboratory screens;  Educated regarding hep  c  Medication reconciliation, past medical history, social history, problem list and allergies were reviewed in detail with the patient  Goals were established with regard to take time to vacation for longer periods of time; educated on adding weight training to plan to assist with osteo within tolerable limits and without pain. Educated on using stretch bands  End of life planning was discussed and has been completed. Will bring copy to the office    Exercise Activities and Dietary recommendations Current Exercise Habits:: Home exercise routine, Type of exercise: walking;strength training/weights, Time (Minutes): 30, Frequency (Times/Week): 5, Weekly Exercise (Minutes/Week): 150  Goals    . patient     More strength training with stretch bands, low weights, go slow and stop with pain  Adapting to change is ongoing; will adapt some stress reduction;   Vacation an little more; Go on vacation x 2 weeks;       Fall Risk Fall Risk  05/06/2015 03/14/2013  Falls in the past year? No No   Depression Screen PHQ 2/9 Scores 05/06/2015 03/14/2013  PHQ - 2 Score 0 0     Cognitive Testing No flowsheet data found.  Immunization History  Administered Date(s) Administered  . Influenza Split 04/06/2011, 04/18/2012  . Influenza Whole 04/18/2007, 04/23/2008, 04/08/2009, 03/26/2010  . Influenza, High Dose Seasonal PF 04/16/2013  .  Influenza,inj,Quad PF,36+ Mos 03/18/2014, 03/24/2015  . Pneumococcal Conjugate-13 11/04/2014  . Pneumococcal Polysaccharide-23 01/22/2007, 11/12/2012  . Tdap 07/26/2012  . Zoster 09/24/2007   Screening Tests Health Maintenance  Topic Date Due  . Hepatitis C Screening  1946/02/05  . INFLUENZA VACCINE  02/02/2016  . MAMMOGRAM  01/19/2017  . TETANUS/TDAP  07/26/2022  . COLONOSCOPY  12/26/2022  . DEXA SCAN  Completed  . ZOSTAVAX  Completed  . PNA vac Low Risk Adult  Completed      Plan:     Order for repeat dexa scan placed Will fup with Dr. Linna Darner regarding back discomfort; thinks this may be stress; r.o other   During the course of the visit the patient was educated and counseled about the following appropriate screening and preventive services:   Vaccines to include Pneumoccal, Influenza, Hepatitis B, Td, Zostavax, HCV/ given flu shot  Electrocardiogram/ 03/14/2013  Cardiovascular Disease / has heart healthy diet and exercises  Colorectal cancer screening/ 12/2022  Bone density screening/ to be completed  Diabetes screening n/a   Glaucoma screening/ neg  Mammography/-01/2015  Nutrition counseling / discussed; Eats well; lost weight at weight loss center and remains compliant with suggestions  Patient Instructions (the written plan) was given to the patient.   Wynetta Fines, RN  05/06/2015

## 2015-05-21 ENCOUNTER — Encounter: Payer: BC Managed Care – PPO | Admitting: Internal Medicine

## 2015-06-02 ENCOUNTER — Ambulatory Visit (INDEPENDENT_AMBULATORY_CARE_PROVIDER_SITE_OTHER): Payer: Medicare Other | Admitting: Internal Medicine

## 2015-06-02 ENCOUNTER — Encounter: Payer: Self-pay | Admitting: Internal Medicine

## 2015-06-02 VITALS — BP 118/70 | HR 86 | Temp 97.8°F | Resp 20 | Ht 63.0 in | Wt 140.5 lb

## 2015-06-02 DIAGNOSIS — E039 Hypothyroidism, unspecified: Secondary | ICD-10-CM | POA: Diagnosis not present

## 2015-06-02 DIAGNOSIS — Z0000 Encounter for general adult medical examination without abnormal findings: Secondary | ICD-10-CM

## 2015-06-02 DIAGNOSIS — M8580 Other specified disorders of bone density and structure, unspecified site: Secondary | ICD-10-CM | POA: Diagnosis not present

## 2015-06-02 DIAGNOSIS — E782 Mixed hyperlipidemia: Secondary | ICD-10-CM

## 2015-06-02 DIAGNOSIS — G43009 Migraine without aura, not intractable, without status migrainosus: Secondary | ICD-10-CM | POA: Diagnosis not present

## 2015-06-02 NOTE — Patient Instructions (Signed)
We have reviewed your prior records including labs and tests today.  Test(s) ordered today. Your results will be released to MyChart (or called to you) after review, usually within 72hours after test completion. If any changes need to be made, you will be notified at that same time.  All other Health Maintenance issues reviewed.   All recommended immunizations and age-appropriate screenings are up-to-date.  No immunizations administered today.   Medications reviewed and updated.  No changes recommended at this time.   Please followup annually   Health Maintenance, Female Adopting a healthy lifestyle and getting preventive care can go a long way to promote health and wellness. Talk with your health care provider about what schedule of regular examinations is right for you. This is a good chance for you to check in with your provider about disease prevention and staying healthy. In between checkups, there are plenty of things you can do on your own. Experts have done a lot of research about which lifestyle changes and preventive measures are most likely to keep you healthy. Ask your health care provider for more information. WEIGHT AND DIET  Eat a healthy diet  Be sure to include plenty of vegetables, fruits, low-fat dairy products, and lean protein.  Do not eat a lot of foods high in solid fats, added sugars, or salt.  Get regular exercise. This is one of the most important things you can do for your health.  Most adults should exercise for at least 150 minutes each week. The exercise should increase your heart rate and make you sweat (moderate-intensity exercise).  Most adults should also do strengthening exercises at least twice a week. This is in addition to the moderate-intensity exercise.  Maintain a healthy weight  Body mass index (BMI) is a measurement that can be used to identify possible weight problems. It estimates body fat based on height and weight. Your health care  provider can help determine your BMI and help you achieve or maintain a healthy weight.  For females 20 years of age and older:   A BMI below 18.5 is considered underweight.  A BMI of 18.5 to 24.9 is normal.  A BMI of 25 to 29.9 is considered overweight.  A BMI of 30 and above is considered obese.  Watch levels of cholesterol and blood lipids  You should start having your blood tested for lipids and cholesterol at 69 years of age, then have this test every 5 years.  You may need to have your cholesterol levels checked more often if:  Your lipid or cholesterol levels are high.  You are older than 69 years of age.  You are at high risk for heart disease.  CANCER SCREENING   Lung Cancer  Lung cancer screening is recommended for adults 55-80 years old who are at high risk for lung cancer because of a history of smoking.  A yearly low-dose CT scan of the lungs is recommended for people who:  Currently smoke.  Have quit within the past 15 years.  Have at least a 30-pack-year history of smoking. A pack year is smoking an average of one pack of cigarettes a day for 1 year.  Yearly screening should continue until it has been 15 years since you quit.  Yearly screening should stop if you develop a health problem that would prevent you from having lung cancer treatment.  Breast Cancer  Practice breast self-awareness. This means understanding how your breasts normally appear and feel.  It also means doing   regular breast self-exams. Let your health care provider know about any changes, no matter how small.  If you are in your 20s or 30s, you should have a clinical breast exam (CBE) by a health care provider every 1-3 years as part of a regular health exam.  If you are 40 or older, have a CBE every year. Also consider having a breast X-ray (mammogram) every year.  If you have a family history of breast cancer, talk to your health care provider about genetic screening.  If you  are at high risk for breast cancer, talk to your health care provider about having an MRI and a mammogram every year.  Breast cancer gene (BRCA) assessment is recommended for women who have family members with BRCA-related cancers. BRCA-related cancers include:  Breast.  Ovarian.  Tubal.  Peritoneal cancers.  Results of the assessment will determine the need for genetic counseling and BRCA1 and BRCA2 testing. Cervical Cancer Your health care provider may recommend that you be screened regularly for cancer of the pelvic organs (ovaries, uterus, and vagina). This screening involves a pelvic examination, including checking for microscopic changes to the surface of your cervix (Pap test). You may be encouraged to have this screening done every 3 years, beginning at age 21.  For women ages 30-65, health care providers may recommend pelvic exams and Pap testing every 3 years, or they may recommend the Pap and pelvic exam, combined with testing for human papilloma virus (HPV), every 5 years. Some types of HPV increase your risk of cervical cancer. Testing for HPV may also be done on women of any age with unclear Pap test results.  Other health care providers may not recommend any screening for nonpregnant women who are considered low risk for pelvic cancer and who do not have symptoms. Ask your health care provider if a screening pelvic exam is right for you.  If you have had past treatment for cervical cancer or a condition that could lead to cancer, you need Pap tests and screening for cancer for at least 20 years after your treatment. If Pap tests have been discontinued, your risk factors (such as having a new sexual partner) need to be reassessed to determine if screening should resume. Some women have medical problems that increase the chance of getting cervical cancer. In these cases, your health care provider may recommend more frequent screening and Pap tests. Colorectal Cancer  This type of  cancer can be detected and often prevented.  Routine colorectal cancer screening usually begins at 69 years of age and continues through 69 years of age.  Your health care provider may recommend screening at an earlier age if you have risk factors for colon cancer.  Your health care provider may also recommend using home test kits to check for hidden blood in the stool.  A small camera at the end of a tube can be used to examine your colon directly (sigmoidoscopy or colonoscopy). This is done to check for the earliest forms of colorectal cancer.  Routine screening usually begins at age 50.  Direct examination of the colon should be repeated every 5-10 years through 69 years of age. However, you may need to be screened more often if early forms of precancerous polyps or small growths are found. Skin Cancer  Check your skin from head to toe regularly.  Tell your health care provider about any new moles or changes in moles, especially if there is a change in a mole's shape or color.    Also tell your health care provider if you have a mole that is larger than the size of a pencil eraser.  Always use sunscreen. Apply sunscreen liberally and repeatedly throughout the day.  Protect yourself by wearing long sleeves, pants, a wide-brimmed hat, and sunglasses whenever you are outside. HEART DISEASE, DIABETES, AND HIGH BLOOD PRESSURE   High blood pressure causes heart disease and increases the risk of stroke. High blood pressure is more likely to develop in:  People who have blood pressure in the high end of the normal range (130-139/85-89 mm Hg).  People who are overweight or obese.  People who are African American.  If you are 65-70 years of age, have your blood pressure checked every 3-5 years. If you are 66 years of age or older, have your blood pressure checked every year. You should have your blood pressure measured twice--once when you are at a hospital or clinic, and once when you are  not at a hospital or clinic. Record the average of the two measurements. To check your blood pressure when you are not at a hospital or clinic, you can use:  An automated blood pressure machine at a pharmacy.  A home blood pressure monitor.  If you are between 83 years and 72 years old, ask your health care provider if you should take aspirin to prevent strokes.  Have regular diabetes screenings. This involves taking a blood sample to check your fasting blood sugar level.  If you are at a normal weight and have a low risk for diabetes, have this test once every three years after 69 years of age.  If you are overweight and have a high risk for diabetes, consider being tested at a younger age or more often. PREVENTING INFECTION  Hepatitis B  If you have a higher risk for hepatitis B, you should be screened for this virus. You are considered at high risk for hepatitis B if:  You were born in a country where hepatitis B is common. Ask your health care provider which countries are considered high risk.  Your parents were born in a high-risk country, and you have not been immunized against hepatitis B (hepatitis B vaccine).  You have HIV or AIDS.  You use needles to inject street drugs.  You live with someone who has hepatitis B.  You have had sex with someone who has hepatitis B.  You get hemodialysis treatment.  You take certain medicines for conditions, including cancer, organ transplantation, and autoimmune conditions. Hepatitis C  Blood testing is recommended for:  Everyone born from [birth year] through 1965.  Anyone with known risk factors for hepatitis C. Sexually transmitted infections (STIs)  You should be screened for sexually transmitted infections (STIs) including gonorrhea and chlamydia if:  You are sexually active and are younger than 69 years of age.  You are older than 69 years of age and your health care provider tells you that you are at risk for this type of  infection.  Your sexual activity has changed since you were last screened and you are at an increased risk for chlamydia or gonorrhea. Ask your health care provider if you are at risk.  If you do not have HIV, but are at risk, it may be recommended that you take a prescription medicine daily to prevent HIV infection. This is called pre-exposure prophylaxis (PrEP). You are considered at risk if:  You are sexually active and do not regularly use condoms or know the HIV status of your  partner(s).  You take drugs by injection.  You are sexually active with a partner who has HIV. Talk with your health care provider about whether you are at high risk of being infected with HIV. If you choose to begin PrEP, you should first be tested for HIV. You should then be tested every 3 months for as long as you are taking PrEP.  PREGNANCY   If you are premenopausal and you may become pregnant, ask your health care provider about preconception counseling.  If you may become pregnant, take 400 to 800 micrograms (mcg) of folic acid every day.  If you want to prevent pregnancy, talk to your health care provider about birth control (contraception). OSTEOPOROSIS AND MENOPAUSE   Osteoporosis is a disease in which the bones lose minerals and strength with aging. This can result in serious bone fractures. Your risk for osteoporosis can be identified using a bone density scan.  If you are 65 years of age or older, or if you are at risk for osteoporosis and fractures, ask your health care provider if you should be screened.  Ask your health care provider whether you should take a calcium or vitamin D supplement to lower your risk for osteoporosis.  Menopause may have certain physical symptoms and risks.  Hormone replacement therapy may reduce some of these symptoms and risks. Talk to your health care provider about whether hormone replacement therapy is right for you.  HOME CARE INSTRUCTIONS   Schedule regular  health, dental, and eye exams.  Stay current with your immunizations.   Do not use any tobacco products including cigarettes, chewing tobacco, or electronic cigarettes.  If you are pregnant, do not drink alcohol.  If you are breastfeeding, limit how much and how often you drink alcohol.  Limit alcohol intake to no more than 1 drink per day for nonpregnant women. One drink equals 12 ounces of beer, 5 ounces of wine, or 1 ounces of hard liquor.  Do not use street drugs.  Do not share needles.  Ask your health care provider for help if you need support or information about quitting drugs.  Tell your health care provider if you often feel depressed.  Tell your health care provider if you have ever been abused or do not feel safe at home.   This information is not intended to replace advice given to you by your health care provider. Make sure you discuss any questions you have with your health care provider.   Document Released: 01/03/2011 Document Revised: 07/11/2014 Document Reviewed: 05/22/2013 Elsevier Interactive Patient Education 2016 [employer]    

## 2015-06-02 NOTE — Assessment & Plan Note (Signed)
Check tsh - will adjust med if needed

## 2015-06-02 NOTE — Assessment & Plan Note (Signed)
Check lipids  On pravastatin 20 mg daily

## 2015-06-02 NOTE — Progress Notes (Signed)
Pre visit review using our clinic review tool, if applicable. No additional management support is needed unless otherwise documented below in the visit note. 

## 2015-06-02 NOTE — Progress Notes (Signed)
Subjective:    Patient ID: Pamela Rich, female    DOB: Nov 06, 1945, 69 y.o.   MRN: [id number]  HPI She is here for a physical exam.  She lives alone.  Her husband died 1.5 years ago.    She has upper back tightness and she thinks it is from tension or fatigue.  It gets painful and tight in her upper back and neck.  Tylenol sometimes helps.  Heat sometimes helps.  It is worse if she overdoes things and it makes it worse. She also thinks driving makes it worse.  Her husband used to do all the driving, now she has to do it all.    Sinus and allergies:  She sees an allergist.  She has always had sinus issues and allergies.  She occasionally has right ear pain. Last year she had an ear infection and sinus infection.  She also wears hearing aides.  She has frequent nasal congestion. She uses saline spray.  She uses an allergy spray at times.    Medications and allergies reviewed with patient and updated if appropriate.  Patient Active Problem List   Diagnosis Date Noted  . Hypernatremia 05/25/2014  . Mixed hyperlipidemia 03/01/2012  . MIGRAINE, COMMON 04/05/2010  . RHINITIS 10/16/2009  . FACIAL PARESTHESIA, LEFT 10/16/2009  . Vitamin D deficiency 03/19/2009  . GERD 07/08/2008  . DEGENERATIVE JOINT DISEASE 12/05/2007  . NOCTURIA 12/05/2007  . Hypothyroidism 01/22/2007  . IBS 09/28/2006  . OSTEOPENIA 09/28/2006    Current Outpatient Prescriptions on File Prior to Visit  Medication Sig Dispense Refill  . Ascorbic Acid (VITAMIN C) 1000 MG tablet Take 1,000 mg by mouth daily.      . butalbital-aspirin-caffeine (FIORINAL) 50-325-40 MG per capsule TAKE ONE CAPSULE BY MOUTH EVERY 6 HOURS AS NEEDED FOR HEADACHE 30 capsule 0  . Cholecalciferol (VITAMIN D3) 1000 UNITS CAPS Take by mouth daily.      Marland Kitchen estradiol (VIVELLE-DOT) 0.075 MG/24HR Place 1 patch onto the skin 2 (two) times a week. 24 patch 4  . folic acid (FOLVITE) A999333 MCG tablet Take 400 mcg by mouth daily.      Marland Kitchen levothyroxine  (SYNTHROID, LEVOTHROID) 112 MCG tablet TAKE 1 TABLET DAILY BEFORE BREAKFAST 90 tablet 2  . Multiple Vitamin (MULTIVITAMIN PO) Take by mouth daily.      . pravastatin (PRAVACHOL) 20 MG tablet TAKE 1 TABLET DAILY 90 tablet 0  . progesterone (PROMETRIUM) 100 MG capsule Take 1 capsule (100 mg total) by mouth daily. 90 capsule 3   No current facility-administered medications on file prior to visit.    Past Medical History  Diagnosis Date  . Hypothyroidism   . Hyperlipidemia   . Migraines   . Osteopenia   . Vitamin D deficiency   . Heart murmur   . Allergy     SEASONAL  . Fibroid     no surgery    Past Surgical History  Procedure Laterality Date  . Septoplasty    . Polypectomy      uterine  . Tubal ligation    . Lumbar fusion      Dr Hal Neer  . Colonoscopy  2014    negative , Dr Ardis Hughs  . Cortisone injections      in left hand    Social History   Social History  . Marital Status: Married    Spouse Name: N/A  . Number of Children: 3  . Years of Education: N/A   Occupational History  .  retired    Social History Main Topics  . Smoking status: Former Smoker -- 0.30 packs/day for 40 years    Types: Cigarettes    Quit date: 07/04/1974  . Smokeless tobacco: Never Used     Comment: smoked 1965-1976 , < 3 cig/ day  . Alcohol Use: No     Comment: rarely  . Drug Use: No  . Sexual Activity: No     Comment: BTL   Other Topics Concern  . None   Social History Narrative    Review of Systems  Constitutional: Negative for fever, chills and fatigue.  HENT: Positive for congestion and postnasal drip.   Respiratory: Negative for cough, shortness of breath and wheezing.   Cardiovascular: Positive for palpitations (occasional). Negative for chest pain and leg swelling.  Gastrointestinal: Negative for nausea, abdominal pain, diarrhea, constipation and blood in stool.       No GERD  Genitourinary: Negative for dysuria and hematuria.  Musculoskeletal: Positive for myalgias.  Negative for back pain and arthralgias.  Neurological: Positive for dizziness (occasional) and headaches (occ migraines). Negative for weakness, light-headedness and numbness.  Psychiatric/Behavioral: Negative for dysphoric mood. The patient is not nervous/anxious.        Objective:   Filed Vitals:   06/02/15 1333  BP: 118/70  Pulse: 86  Temp: 97.8 F (36.6 C)  Resp: 20   Filed Weights   06/02/15 1333  Weight: 140 lb 8 oz (63.73 kg)   Body mass index is 24.89 kg/(m^2).   Physical Exam Constitutional: She appears well-developed and well-nourished. No distress.  HENT:  Head: Normocephalic and atraumatic.  Right Ear: External ear normal.  Left Ear: External ear normal.  Mouth/Throat: Oropharynx is clear and moist.  Normal bilateral ear canals and tympanic membranes  Eyes: Conjunctivae and EOM are normal.  Neck: Neck supple. No tracheal deviation present. No thyromegaly present.  No carotid bruit  Cardiovascular: Normal rate, regular rhythm and normal heart sounds.   No murmur heard. Pulmonary/Chest: Effort normal and breath sounds normal. No respiratory distress. She has no wheezes. She has no rales.  Abdominal: Soft. She exhibits no distension. There is no tenderness.  Musculoskeletal: She exhibits no edema.  Lymphadenopathy:    She has no cervical adenopathy.  Skin: Skin is warm and dry. She is not diaphoretic.  Psychiatric: She has a normal mood and affect. Her behavior is normal.         Assessment & Plan:   Physical exam: Screening blood work Sees derm annually Sees gyn annually colonoscopy and mammo up to date dexa up to date Immunizations up to date Exercising regularly No concerns with substance abuse No concerns with depression or anxiety No skin concerns  See Problem List.  Follow up annually

## 2015-06-02 NOTE — Assessment & Plan Note (Signed)
Taking vitamin d Exercising regularly dexa up to date

## 2015-06-02 NOTE — Assessment & Plan Note (Signed)
Takes fiorinal as needed Typically gets migraines less than once a week

## 2015-06-04 ENCOUNTER — Other Ambulatory Visit (INDEPENDENT_AMBULATORY_CARE_PROVIDER_SITE_OTHER): Payer: Medicare Other

## 2015-06-04 DIAGNOSIS — E039 Hypothyroidism, unspecified: Secondary | ICD-10-CM

## 2015-06-04 DIAGNOSIS — Z0000 Encounter for general adult medical examination without abnormal findings: Secondary | ICD-10-CM

## 2015-06-04 LAB — COMPREHENSIVE METABOLIC PANEL
ALBUMIN: 4 g/dL (ref 3.5–5.2)
ALK PHOS: 58 U/L (ref 39–117)
ALT: 15 U/L (ref 0–35)
AST: 24 U/L (ref 0–37)
BUN: 14 mg/dL (ref 6–23)
CHLORIDE: 103 meq/L (ref 96–112)
CO2: 29 mEq/L (ref 19–32)
Calcium: 9 mg/dL (ref 8.4–10.5)
Creatinine, Ser: 0.81 mg/dL (ref 0.40–1.20)
GFR: 74.41 mL/min (ref >60.00)
GLUCOSE: 97 mg/dL (ref 70–99)
POTASSIUM: 4.2 meq/L (ref 3.5–5.1)
SODIUM: 138 meq/L (ref 135–145)
TOTAL PROTEIN: 7 g/dL (ref 6.0–8.3)
Total Bilirubin: 0.4 mg/dL (ref 0.2–1.2)

## 2015-06-04 LAB — CBC WITH DIFFERENTIAL/PLATELET
Basophils Absolute: 0 10*3/uL (ref 0.0–0.1)
Basophils Relative: 0.6 % (ref 0.0–3.0)
EOS ABS: 0.4 10*3/uL (ref 0.0–0.7)
EOS PCT: 5.1 % — AB (ref 0.0–5.0)
HCT: 43.2 % (ref 36.0–46.0)
HEMOGLOBIN: 14.4 g/dL (ref 12.0–15.0)
LYMPHS ABS: 2.2 10*3/uL (ref 0.7–4.0)
Lymphocytes Relative: 28.6 % (ref 12.0–46.0)
MCHC: 33.4 g/dL (ref 30.0–36.0)
MCV: 93.5 fl (ref 78.0–100.0)
MONO ABS: 0.6 10*3/uL (ref 0.1–1.0)
Monocytes Relative: 7.4 % (ref 3.0–12.0)
NEUTROS PCT: 58.3 % (ref 43.0–77.0)
Neutro Abs: 4.5 10*3/uL (ref 1.4–7.7)
Platelets: 362 10*3/uL (ref 150.0–400.0)
RBC: 4.62 Mil/uL (ref 3.87–5.11)
RDW: 13.4 % (ref 11.5–15.5)
WBC: 7.7 10*3/uL (ref 4.0–10.5)

## 2015-06-04 LAB — LIPID PANEL
Cholesterol: 193 mg/dL (ref 0–200)
HDL: 56.7 mg/dL (ref >39.00)
LDL CALC: 119 mg/dL — AB (ref 0–99)
NONHDL: 136.02
Total CHOL/HDL Ratio: 3
Triglycerides: 83 mg/dL (ref 0.0–149.0)
VLDL: 16.6 mg/dL (ref 0.0–40.0)

## 2015-06-04 LAB — TSH: TSH: 1.72 u[IU]/mL (ref 0.35–4.50)

## 2015-06-05 LAB — HEPATITIS C ANTIBODY: HCV AB: NEGATIVE

## 2015-06-07 ENCOUNTER — Encounter: Payer: Self-pay | Admitting: Internal Medicine

## 2015-06-15 ENCOUNTER — Telehealth: Payer: Self-pay | Admitting: Internal Medicine

## 2015-06-15 ENCOUNTER — Encounter: Payer: Self-pay | Admitting: Internal Medicine

## 2015-06-15 ENCOUNTER — Ambulatory Visit: Payer: Self-pay | Admitting: Family Medicine

## 2015-06-15 ENCOUNTER — Ambulatory Visit (INDEPENDENT_AMBULATORY_CARE_PROVIDER_SITE_OTHER): Payer: Medicare Other | Admitting: Internal Medicine

## 2015-06-15 ENCOUNTER — Ambulatory Visit: Payer: Self-pay | Admitting: Internal Medicine

## 2015-06-15 VITALS — BP 134/88 | HR 69 | Temp 98.3°F | Resp 18 | Wt 139.0 lb

## 2015-06-15 DIAGNOSIS — J320 Chronic maxillary sinusitis: Secondary | ICD-10-CM

## 2015-06-15 MED ORDER — LEVOTHYROXINE SODIUM 112 MCG PO TABS: 112.0000 ug | ORAL_TABLET | Freq: Every day | ORAL | Status: DC

## 2015-06-15 MED ORDER — PRAVASTATIN SODIUM 20 MG PO TABS: 20.0000 mg | ORAL_TABLET | Freq: Every day | ORAL | Status: DC

## 2015-06-15 MED ORDER — CEFDINIR 300 MG PO CAPS: 300.0000 mg | ORAL_CAPSULE | Freq: Two times a day (BID) | ORAL | Status: DC

## 2015-06-15 NOTE — Telephone Encounter (Signed)
[facility]  Patient Name: Pamela Rich  DOB: [birth year]    Initial Comment caller states she has had a head cold, sinus problems, and had a fever of 99 yesterday, and a cough   Nurse Assessment  Nurse: Wayne Sever, RN, Tillie Rung Date/Time (Eastern Time): 06/15/2015 10:03:41 AM  Confirm and document reason for call. If symptomatic, describe symptoms. ---Caller states she has had a cold for a week. She states she called allergy and asthma and they cannot see her today. She has a rattle in her chest when she coughs. She states she is coughing up some phlegm and she had a low grade fever. She has a normal temperature today. She is concerned if she can get over this or not. Caller states she usually gets a steroid shot to open her head up.  Has the patient traveled out of the country within the last 30 days? ---No  Does the patient have any new or worsening symptoms? ---Yes  Will a triage be completed? ---Yes  Related visit to physician within the last 2 weeks? ---No  Does the PT have any chronic conditions? (i.e. diabetes, asthma, etc.) ---No  Is this a behavioral health or substance abuse call? ---No     Guidelines    Guideline Title Affirmed Question Affirmed Notes  Cough - Acute Productive [1] Sinus pain (around cheekbone or eye) AND [2] present > 24 hours using nasal washes and pain meds    Final Disposition User   See Physician within 24 Hours Wayne Sever, RN, [employer]    Referrals  REFERRED TO PCP OFFICE   Disagree/Comply: Leta Baptist

## 2015-06-15 NOTE — Progress Notes (Signed)
Pre visit review using our clinic review tool, if applicable. No additional management support is needed unless otherwise documented below in the visit note. 

## 2015-06-15 NOTE — Telephone Encounter (Signed)
Patient is needing prescriptions pravastatin (PRAVACHOL) 20 MG tablet [id number] and levothyroxine (SYNTHROID, LEVOTHROID) 112 MCG tablet [id number]  She only has a week's worth of both medications.  Can you send a two week or month prescription to [facility] on [address] and then her 3 mos supply to [facility].

## 2015-06-15 NOTE — Progress Notes (Signed)
Subjective:    Patient ID: Pamela Rich, female    DOB: March 26, 1946, 69 y.o.   MRN: [id number]  HPI She is here for an acute visit for a possible sinus infection. Her symptoms started over one week ago.  She is having a productive cough, nasal congestion, sinus pain, ear pain, sore throat, fever and dizziness.  She has had some mild sob and wheeze.  She has history of an ear infection and sinus infection last summer she was on two different antibiotics for sinus infection and bronchitis.   She has been taking alka seltzer cold, tylenol sinus medication and there has been no improvement.    Medications and allergies reviewed with patient and updated if appropriate.  Patient Active Problem List   Diagnosis Date Noted  . Hypernatremia 05/25/2014  . Mixed hyperlipidemia 03/01/2012  . Migraine without aura 04/05/2010  . RHINITIS 10/16/2009  . FACIAL PARESTHESIA, LEFT 10/16/2009  . Vitamin D deficiency 03/19/2009  . GERD 07/08/2008  . DEGENERATIVE JOINT DISEASE 12/05/2007  . NOCTURIA 12/05/2007  . Hypothyroidism 01/22/2007  . IBS 09/28/2006  . Osteopenia 09/28/2006    Current Outpatient Prescriptions on File Prior to Visit  Medication Sig Dispense Refill  . Ascorbic Acid (VITAMIN C) 1000 MG tablet Take 1,000 mg by mouth daily.      . butalbital-aspirin-caffeine (FIORINAL) 50-325-40 MG per capsule TAKE ONE CAPSULE BY MOUTH EVERY 6 HOURS AS NEEDED FOR HEADACHE 30 capsule 0  . Cholecalciferol (VITAMIN D3) 1000 UNITS CAPS Take by mouth daily.      Marland Kitchen estradiol (VIVELLE-DOT) 0.075 MG/24HR Place 1 patch onto the skin 2 (two) times a week. 24 patch 4  . folic acid (FOLVITE) A999333 MCG tablet Take 400 mcg by mouth daily.      . Multiple Vitamin (MULTIVITAMIN PO) Take by mouth daily.      . progesterone (PROMETRIUM) 100 MG capsule Take 1 capsule (100 mg total) by mouth daily. 90 capsule 3   No current facility-administered medications on file prior to visit.    Past Medical History    Diagnosis Date  . Hypothyroidism   . Hyperlipidemia   . Migraines   . Osteopenia   . Vitamin D deficiency   . Heart murmur   . Allergy     SEASONAL  . Fibroid     no surgery    Past Surgical History  Procedure Laterality Date  . Septoplasty    . Polypectomy      uterine  . Tubal ligation    . Lumbar fusion      Dr Hal Neer  . Colonoscopy  2014    negative , Dr Ardis Hughs  . Cortisone injections      in left hand    Social History   Social History  . Marital Status: Married    Spouse Name: N/A  . Number of Children: 3  . Years of Education: N/A   Occupational History  . retired    Social History Main Topics  . Smoking status: Former Smoker -- 0.30 packs/day for 40 years    Types: Cigarettes    Quit date: 07/04/1974  . Smokeless tobacco: Never Used     Comment: smoked 1965-1976 , < 3 cig/ day  . Alcohol Use: No     Comment: rarely  . Drug Use: No  . Sexual Activity: No     Comment: BTL   Other Topics Concern  . None   Social History Narrative   Exercise: walking,  stretching, weights    Review of Systems  Constitutional: Positive for fever (low grade). Negative for chills.  HENT: Positive for congestion, ear pain (right ear pain), sinus pressure and sore throat.        Loss of taste  Respiratory: Positive for cough, shortness of breath and wheezing. Negative for chest tightness.   Cardiovascular: Negative for chest pain.  Gastrointestinal: Negative for nausea.  Neurological: Positive for dizziness. Negative for light-headedness and headaches.       Objective:   Filed Vitals:   06/15/15 1405  BP: 134/88  Pulse: 69  Temp: 98.3 F (36.8 C)  Resp: 18   Filed Weights   06/15/15 1405  Weight: 139 lb (63.05 kg)   Body mass index is 24.63 kg/(m^2).   Physical Exam GENERAL APPEARANCE: Appears stated age, well appearing, NAD EYES: conjunctiva clear, no icterus HEENT: bilateral tympanic membranes and ear canals normal, oropharynx with mild  erythema, no thyromegaly, trachea midline, no cervical or supraclavicular lymphadenopathy LUNGS: Clear to auscultation without wheeze or crackles, unlabored breathing, good air entry bilaterally HEART: Normal S1,S2 without murmurs EXTREMITIES: Without clubbing, cyanosis, or edema      Assessment & Plan:   Sinus infection Concern for bacterial infection given her history Antibiotic prescribed - omnicef Continue saline spray, tylenol sinus and cold medication Rest, fluids  Call if no improvement

## 2015-06-15 NOTE — Patient Instructions (Signed)
An antibiotic was sent to your pharmacy.  Continue the increase rest and fluids.  Continue the saline nasal spray and over-the-counter cold medications as needed.   Call or return if no improvement.

## 2015-06-18 ENCOUNTER — Telehealth: Payer: Self-pay | Admitting: Internal Medicine

## 2015-06-18 NOTE — Telephone Encounter (Signed)
Pt is requesting to become a patient of [facility] for allergy. This has to be approved by [facility].  [facility] - please advise. Thanks.

## 2015-06-23 NOTE — Telephone Encounter (Signed)
[facility] - please advise. Thanks! 

## 2015-06-26 NOTE — Telephone Encounter (Signed)
Per Joellen Jersey, patient needs approval from [facility] before appointment can be scheduled for Allergy Consult. Dr. Annamaria Boots, do you want to see this patient? Please advise.  (Per Dr. Quay Burow OV notes on 06/02/15: Sinus and allergies: She sees an allergist. She has always had sinus issues and allergies. She occasionally has right ear pain. Last year she had an ear infection and sinus infection. She also wears hearing aides. She has frequent nasal congestion. She uses saline spray. She uses an allergy spray at times.)

## 2015-06-26 NOTE — Telephone Encounter (Signed)
Per Dr. Annamaria Boots, can schedule patient for Allergy Consult at first available appointment or pt can see [facility] if April is not soon enough. Front desk - ok to schedule Consult in April (or 1st available) per [employer] and left message advising patient to call and schedule appointment, advised her via voicemail that [facility] will see her at 1st available which as of today is not until April 2017. Awaiting call back from patient.

## 2015-06-26 NOTE — Telephone Encounter (Signed)
Ok to offer her appointment when we can. If that is not soon enough for her then she can see somebody else

## 2015-06-30 NOTE — Telephone Encounter (Signed)
Spoke with pt. States that we waited to long to contact her. She made an allergy appointment with another provider. Nothing further was needed.

## 2015-07-20 ENCOUNTER — Ambulatory Visit (INDEPENDENT_AMBULATORY_CARE_PROVIDER_SITE_OTHER): Payer: BC Managed Care – PPO | Admitting: Pediatrics

## 2015-07-20 ENCOUNTER — Encounter: Payer: Self-pay | Admitting: Pediatrics

## 2015-07-20 VITALS — BP 126/76 | HR 70 | Temp 98.1°F | Resp 16 | Ht 63.19 in | Wt 140.2 lb

## 2015-07-20 DIAGNOSIS — J4530 Mild persistent asthma, uncomplicated: Secondary | ICD-10-CM

## 2015-07-20 DIAGNOSIS — J4520 Mild intermittent asthma, uncomplicated: Secondary | ICD-10-CM

## 2015-07-20 DIAGNOSIS — J3089 Other allergic rhinitis: Secondary | ICD-10-CM

## 2015-07-20 HISTORY — DX: Mild intermittent asthma, uncomplicated: J45.20

## 2015-07-20 MED ORDER — FLUTICASONE PROPIONATE 50 MCG/ACT NA SUSP: 2.0000 | Freq: Every day | NASAL | Status: DC

## 2015-07-20 MED ORDER — AZELASTINE HCL 0.15 % NA SOLN: 2.0000 | Freq: Two times a day (BID) | NASAL | Status: DC

## 2015-07-20 MED ORDER — MONTELUKAST SODIUM 10 MG PO TABS: 10.0000 mg | ORAL_TABLET | Freq: Every day | ORAL | Status: DC

## 2015-07-20 MED ORDER — ALBUTEROL SULFATE HFA 108 (90 BASE) MCG/ACT IN AERS: 2.0000 | INHALATION_SPRAY | RESPIRATORY_TRACT | Status: DC | PRN

## 2015-07-20 NOTE — Patient Instructions (Addendum)
Environmental control of dust and mold Cetirizine 10 mg once a day Fluticasone 2 sprays per nostril once a day for stuffy nose or drainage Montelukast 10 mg once a day for cough or wheeze Pro-air 2 puffs every 4 hours if needed for wheezing or coughing spells Add prednisone 10 mg twice a day for 4 days, 10 mg on day  5 to bring the allergic symptoms  under control Azelastine 0.1 %- 2 sprays per nostril twice a day for sinus headache

## 2015-07-20 NOTE — Progress Notes (Signed)
[address] [place] [address] Dept: [phone]  New Patient Note  Patient ID: Pamela Rich, female    DOB: 06/14/1946  Age: 70 y.o. MRN: [id number] Date of Office Visit: 07/20/2015 Referring provider: No referring provider defined for this encounter.    Chief Complaint: Cough and Nasal Congestion  HPI Pamela Rich presents for evaluation of nasal congestion and coughing spells. She had a sinus infection in December and was treated with an antibiotic. In December she began to have coughing spells, shortness of breath, nasal congestion and hoarseness. She has a history of borderline asthma. Her symptoms are usually worse in the fall of the year. She has aggravation of her symptoms on exposure to dust and cigarette smoke. She used to take allergy injections to dust and molds many years ago.   Review of Systems  Constitutional: Negative.   HENT:       Nasal congestion for several years  Eyes: Negative.   Respiratory: Positive for cough and shortness of breath.        History of mild asthma  Cardiovascular: Negative.   Gastrointestinal: Negative.   Genitourinary: Negative.   Musculoskeletal: Negative.   Skin: Negative.   Neurological: Negative.   Endo/Heme/Allergies:       Hypothyroidism  Psychiatric/Behavioral: Negative.     Outpatient Encounter Prescriptions as of 07/20/2015  Medication Sig  . Ascorbic Acid (VITAMIN C) 1000 MG tablet Take 1,000 mg by mouth daily.    . butalbital-aspirin-caffeine (FIORINAL) 50-325-40 MG per capsule TAKE ONE CAPSULE BY MOUTH EVERY 6 HOURS AS NEEDED FOR HEADACHE  . Cholecalciferol (VITAMIN D3) 1000 UNITS CAPS Take by mouth daily.    Marland Kitchen estradiol (VIVELLE-DOT) 0.075 MG/24HR Place 1 patch onto the skin 2 (two) times a week.  . folic acid (FOLVITE) A999333 MCG tablet Take 400 mcg by mouth daily.    Marland Kitchen levothyroxine (SYNTHROID, LEVOTHROID) 112 MCG tablet Take 1 tablet (112 mcg total) by mouth daily before breakfast.  . Multiple  Vitamin (MULTIVITAMIN PO) Take by mouth daily.    . pravastatin (PRAVACHOL) 20 MG tablet Take 1 tablet (20 mg total) by mouth daily.  . progesterone (PROMETRIUM) 100 MG capsule Take 1 capsule (100 mg total) by mouth daily.  Marland Kitchen albuterol (PROAIR HFA) 108 (90 Base) MCG/ACT inhaler Inhale 2 puffs into the lungs every 4 (four) hours as needed for wheezing. Or coughing spells.  . Azelastine HCl 0.15 % SOLN Place 2 sprays into both nostrils 2 (two) times daily. For sinus headache.  . cefdinir (OMNICEF) 300 MG capsule Take 1 capsule (300 mg total) by mouth 2 (two) times daily. (Patient not taking: Reported on 07/20/2015)  . fluticasone (FLONASE) 50 MCG/ACT nasal spray Place 2 sprays into both nostrils daily. 2 Sprays per nostril once a day for stuffy nose or drainage.  . montelukast (SINGULAIR) 10 MG tablet Take 1 tablet (10 mg total) by mouth daily. For cough or wheeze.   No facility-administered encounter medications on file as of 07/20/2015.     Drug Allergies:  Allergies  Allergen Reactions  . Pneumococcal Vaccines     Area weakness, redness, and swelling   . Gabapentin     constipation    Family History: Stephonie's family history includes Allergic rhinitis in her mother; Cancer in her brother; Diabetes in her father; Heart attack (age of onset: 28) in her mother; Heart disease in her mother; Hypertension in her mother; Osteoporosis in her mother; Stroke in her father. There is no history of Colon  cancer, Esophageal cancer, Rectal cancer, Stomach cancer, Angioedema, Asthma, Eczema, Immunodeficiency, or Urticaria..  Social and environmental-she is retired . There are no pets in home . She is not exposed to cigarette smoke  Physical Exam: BP 126/76 mmHg  Pulse 70  Temp(Src) 98.1 F (36.7 C) (Oral)  Resp 16  Ht 5' 3.19" (1.605 m)  Wt 140 lb 3.4 oz (63.6 kg)  BMI 24.69 kg/m2  LMP 07/04/2000   Physical Exam  Constitutional: She is oriented to person, place, and time. She appears  well-developed and well-nourished.  HENT:  Eyes normal. Ears normal. Nose moderate swelling of turbinates . Pharynx normal  Neck: Neck supple. No thyromegaly present.  Cardiovascular: Normal rate and regular rhythm.   No murmur heard. Pulmonary/Chest:  Clear to percussion and auscultation  Abdominal: Soft. There is no tenderness.  No hepatosplenomegaly  Musculoskeletal: Normal range of motion.  Lymphadenopathy:    She has no cervical adenopathy.  Neurological: She is alert and oriented to person, place, and time.  Skin:  clear  Psychiatric: She has a normal mood and affect. Her behavior is normal. Judgment and thought content normal.  Vitals reviewed.   Diagnostics: Allergy skin testing was positive to some molds on intradermal testing only.  FVC 2.11 L FEV1 1.22 L. Predicted FVC 2.91 L predicted FEV1 2.20 L. After albuterol 2 puffs FVC 2.25 L FEV1 1.83 L-this shows a mild reduction in the forced vital capacity and FEV1 with significant improvement in the FEV1 after albuterol   Assessment Assessment and Plan: 1. Mild intermittent asthma, uncomplicated   2. Other allergic rhinitis     Meds ordered this encounter  Medications  . fluticasone (FLONASE) 50 MCG/ACT nasal spray    Sig: Place 2 sprays into both nostrils daily. 2 Sprays per nostril once a day for stuffy nose or drainage.    Dispense:  16 g    Refill:  5  . montelukast (SINGULAIR) 10 MG tablet    Sig: Take 1 tablet (10 mg total) by mouth daily. For cough or wheeze.    Dispense:  30 tablet    Refill:  5  . albuterol (PROAIR HFA) 108 (90 Base) MCG/ACT inhaler    Sig: Inhale 2 puffs into the lungs every 4 (four) hours as needed for wheezing. Or coughing spells.    Dispense:  1 Inhaler    Refill:  1  . Azelastine HCl 0.15 % SOLN    Sig: Place 2 sprays into both nostrils 2 (two) times daily. For sinus headache.    Dispense:  30 mL    Refill:  5    Patient Instructions  Environmental control of dust and  mold Cetirizine 10 mg once a day Fluticasone 2 sprays per nostril once a day for stuffy nose or drainage Montelukast 10 mg once a day for cough or wheeze Pro-air 2 puffs every 4 hours if needed for wheezing or coughing spells Add prednisone 10 mg twice a day for 4 days, 10 mg on day  5 to bring the allergic symptoms  under control Azelastine 0.1 %- 2 sprays per nostril twice a day for sinus headache    Return in about 4 weeks (around 08/17/2015).   Thank you for the opportunity to care for this patient.  Please do not hesitate to contact me with questions.  Penne Lash, M.D.  [facility] [address] [place], [place] [address] [phone]

## 2015-07-27 ENCOUNTER — Ambulatory Visit: Payer: Medicare Other | Admitting: Obstetrics & Gynecology

## 2015-07-28 ENCOUNTER — Encounter: Payer: Self-pay | Admitting: Obstetrics & Gynecology

## 2015-07-28 ENCOUNTER — Ambulatory Visit (INDEPENDENT_AMBULATORY_CARE_PROVIDER_SITE_OTHER): Payer: Medicare Other | Admitting: Obstetrics & Gynecology

## 2015-07-28 VITALS — BP 108/60 | HR 72 | Resp 16 | Ht 63.0 in | Wt 139.0 lb

## 2015-07-28 DIAGNOSIS — Z0000 Encounter for general adult medical examination without abnormal findings: Secondary | ICD-10-CM | POA: Diagnosis not present

## 2015-07-28 DIAGNOSIS — Z01419 Encounter for gynecological examination (general) (routine) without abnormal findings: Secondary | ICD-10-CM | POA: Diagnosis not present

## 2015-07-28 DIAGNOSIS — Z124 Encounter for screening for malignant neoplasm of cervix: Secondary | ICD-10-CM

## 2015-07-28 LAB — POCT URINALYSIS DIPSTICK
Bilirubin, UA: NEGATIVE
GLUCOSE UA: NEGATIVE
KETONES UA: NEGATIVE
Leukocytes, UA: NEGATIVE
Nitrite, UA: NEGATIVE
PROTEIN UA: NEGATIVE
RBC UA: NEGATIVE
UROBILINOGEN UA: NEGATIVE
pH, UA: 6

## 2015-07-28 MED ORDER — ESTRADIOL 0.05 MG/24HR TD PTTW: 1.0000 | MEDICATED_PATCH | TRANSDERMAL | Status: DC

## 2015-07-28 MED ORDER — PROGESTERONE MICRONIZED 100 MG PO CAPS: 100.0000 mg | ORAL_CAPSULE | Freq: Every day | ORAL | Status: DC

## 2015-07-28 NOTE — Progress Notes (Signed)
70 y.o. G4P3 WidowedCaucasianF here for annual exam.  Husband died over a year ago.  Has adjusted well.  Doing well.  No vaginal bleeding.    PCP: Dr. Quay Burow  Patient's last menstrual period was 07/04/2000.          Sexually active: No.  The current method of family planning is post menopausal status.    Exercising: Yes.    Walking Smoker:  no  Health Maintenance: Pap:  04/02/13 Neg History of abnormal Pap:  no MMG: 01/20/15 BIRADS1:neg Colonoscopy:  12/25/12 Normal - repeat 10 years  BMD:   03/12/12, -1.2 Hip TDaP:  07/2012  Screening Labs: PCP, Hb today: PCP, Urine today: pending    reports that she quit smoking about 41 years ago. Her smoking use included Cigarettes. She has a 12 pack-year smoking history. She has never used smokeless tobacco. She reports that she does not drink alcohol or use illicit drugs.  Past Medical History  Diagnosis Date  . Hypothyroidism   . Hyperlipidemia   . Migraines   . Osteopenia   . Vitamin D deficiency   . Heart murmur   . Allergy     SEASONAL  . Fibroid     no surgery  . Mild intermittent asthma 07/20/2015    Past Surgical History  Procedure Laterality Date  . Septoplasty    . Polypectomy      uterine  . Tubal ligation    . Lumbar fusion      Dr Hal Neer  . Colonoscopy  2014    negative , Dr Ardis Hughs  . Cortisone injections      in left hand  . Sinoscopy      Current Outpatient Prescriptions  Medication Sig Dispense Refill  . albuterol (PROAIR HFA) 108 (90 Base) MCG/ACT inhaler Inhale 2 puffs into the lungs every 4 (four) hours as needed for wheezing. Or coughing spells. 1 Inhaler 1  . Ascorbic Acid (VITAMIN C) 1000 MG tablet Take 1,000 mg by mouth daily.      Marland Kitchen aspirin 81 MG tablet Take 81 mg by mouth daily.    . Azelastine HCl 0.15 % SOLN Place 2 sprays into both nostrils 2 (two) times daily. For sinus headache. 30 mL 5  . butalbital-aspirin-caffeine (FIORINAL) 50-325-40 MG per capsule TAKE ONE CAPSULE BY MOUTH EVERY 6 HOURS AS  NEEDED FOR HEADACHE 30 capsule 0  . Cholecalciferol (VITAMIN D3) 1000 UNITS CAPS Take by mouth daily.      Marland Kitchen estradiol (VIVELLE-DOT) 0.075 MG/24HR Place 1 patch onto the skin 2 (two) times a week. 24 patch 4  . fluticasone (FLONASE) 50 MCG/ACT nasal spray Place 2 sprays into both nostrils daily. 2 Sprays per nostril once a day for stuffy nose or drainage. 16 g 5  . folic acid (FOLVITE) A999333 MCG tablet Take 400 mcg by mouth daily.      Marland Kitchen levothyroxine (SYNTHROID, LEVOTHROID) 112 MCG tablet Take 1 tablet (112 mcg total) by mouth daily before breakfast. 30 tablet 0  . montelukast (SINGULAIR) 10 MG tablet Take 1 tablet (10 mg total) by mouth daily. For cough or wheeze. 30 tablet 5  . Multiple Vitamin (MULTIVITAMIN PO) Take by mouth daily.      . pravastatin (PRAVACHOL) 20 MG tablet Take 1 tablet (20 mg total) by mouth daily. 30 tablet 0  . progesterone (PROMETRIUM) 100 MG capsule Take 1 capsule (100 mg total) by mouth daily. 90 capsule 3   No current facility-administered medications for this visit.  Family History  Problem Relation Age of Onset  . Hypertension Mother   . Heart disease Mother     CHF; AS  . Osteoporosis Mother   . Heart attack Mother 32  . Allergic rhinitis Mother   . Diabetes Father   . Stroke Father     onset in 23s  . Cancer Brother     malignant brain tumor  . Colon cancer Neg Hx   . Esophageal cancer Neg Hx   . Rectal cancer Neg Hx   . Stomach cancer Neg Hx   . Angioedema Neg Hx   . Asthma Neg Hx   . Eczema Neg Hx   . Immunodeficiency Neg Hx   . Urticaria Neg Hx     ROS:  Pertinent items are noted in HPI.  Otherwise, a comprehensive ROS was negative.  Exam:   BP 108/60 mmHg  Pulse 72  Resp 16  Ht 5\' 3"  (1.6 m)  Wt 139 lb (63.05 kg)  BMI 24.63 kg/m2  LMP 07/04/2000  Weight change:  +2#Height: 5\' 3"  (160 cm)  Ht Readings from Last 3 Encounters:  07/28/15 5\' 3"  (1.6 m)  07/20/15 5' 3.19" (1.605 m)  06/02/15 5\' 3"  (1.6 m)    General appearance:  alert, cooperative and appears stated age Head: Normocephalic, without obvious abnormality, atraumatic Neck: no adenopathy, supple, symmetrical, trachea midline and thyroid normal to inspection and palpation Lungs: clear to auscultation bilaterally Breasts: normal appearance, no masses or tenderness Heart: regular rate and rhythm Abdomen: soft, non-tender; bowel sounds normal; no masses,  no organomegaly Extremities: extremities normal, atraumatic, no cyanosis or edema Skin: Skin color, texture, turgor normal. No rashes or lesions Lymph nodes: Cervical, supraclavicular, and axillary nodes normal. No abnormal inguinal nodes palpated Neurologic: Grossly normal   Pelvic: External genitalia:  no lesions              Urethra:  normal appearing urethra with no masses, tenderness or lesions              Bartholins and Skenes: normal                 Vagina: normal appearing vagina with normal color and discharge, no lesions              Cervix: no lesions              Pap taken: Yes.   Bimanual Exam:  Uterus:  normal size, contour, position, consistency, mobility, non-tender              Adnexa: normal adnexa and no mass, fullness, tenderness               Rectovaginal: Confirms               Anus:  normal sphincter tone, no lesions  Chaperone was present for exam.  A:  Well Woman with normal exam PMP, on HRT Hypothyroidism Elevated lipids Osteopenia  P: Mammogram yearly pap smear 2014. Pap today. Decrease vivelle dot to 0.05mg  patches 1/2 patch twice weekly and Prometrium 100mg  daily. 90 day supply with 1year RF to mail order pharmacy.  Pt knows I would like for her to try and taper off of this.  She will try this year.  Aware of risks of DVT/PE, stroke, MI, and breast cancer Labs with Dr. Quay Burow return annually or prn

## 2015-07-30 LAB — IPS PAP SMEAR ONLY

## 2015-08-07 DIAGNOSIS — D1801 Hemangioma of skin and subcutaneous tissue: Secondary | ICD-10-CM | POA: Diagnosis not present

## 2015-08-07 DIAGNOSIS — L821 Other seborrheic keratosis: Secondary | ICD-10-CM | POA: Diagnosis not present

## 2015-08-07 DIAGNOSIS — L918 Other hypertrophic disorders of the skin: Secondary | ICD-10-CM | POA: Diagnosis not present

## 2015-08-07 DIAGNOSIS — D2272 Melanocytic nevi of left lower limb, including hip: Secondary | ICD-10-CM | POA: Diagnosis not present

## 2015-08-07 DIAGNOSIS — D2261 Melanocytic nevi of right upper limb, including shoulder: Secondary | ICD-10-CM | POA: Diagnosis not present

## 2015-08-07 DIAGNOSIS — D2262 Melanocytic nevi of left upper limb, including shoulder: Secondary | ICD-10-CM | POA: Diagnosis not present

## 2015-08-07 DIAGNOSIS — D2271 Melanocytic nevi of right lower limb, including hip: Secondary | ICD-10-CM | POA: Diagnosis not present

## 2015-08-07 DIAGNOSIS — D225 Melanocytic nevi of trunk: Secondary | ICD-10-CM | POA: Diagnosis not present

## 2015-08-07 DIAGNOSIS — L812 Freckles: Secondary | ICD-10-CM | POA: Diagnosis not present

## 2015-08-17 ENCOUNTER — Ambulatory Visit (INDEPENDENT_AMBULATORY_CARE_PROVIDER_SITE_OTHER): Payer: Medicare Other | Admitting: Pediatrics

## 2015-08-17 ENCOUNTER — Encounter: Payer: Self-pay | Admitting: Pediatrics

## 2015-08-17 VITALS — BP 116/64 | HR 64 | Resp 16

## 2015-08-17 DIAGNOSIS — J4520 Mild intermittent asthma, uncomplicated: Secondary | ICD-10-CM

## 2015-08-17 DIAGNOSIS — J3089 Other allergic rhinitis: Secondary | ICD-10-CM | POA: Diagnosis not present

## 2015-08-17 NOTE — Progress Notes (Signed)
  [address] [place] [address] Dept: [phone]  FOLLOW UP NOTE  Patient ID: Pamela Rich, female    DOB: 1946-02-01  Age: 70 y.o. MRN: [id number] Date of Office Visit: 08/17/2015  Assessment Chief Complaint: Asthma  HPI Pamela Rich presents for follow-up of allergic rhinitis and asthma. She could not tolerate azelastine 0.15% because though it helped her sinus headaches, it gave her a sore throat. Zyrtec is drying out her nasal passages too  much. She has not had used Singulair 10 mg once a day. She has not had to use Pro-air.   Drug Allergies:  Allergies  Allergen Reactions  . Pneumococcal Vaccines     Area weakness, redness, and swelling   . Gabapentin     constipation    Physical Exam: BP 116/64 mmHg  Pulse 64  Resp 16  LMP 07/04/2000   Physical Exam  Constitutional: She is oriented to person, place, and time. She appears well-developed and well-nourished.  HENT:  Is normal. Ears normal. Nose normal. Pharynx normal.  Neck: Neck supple.  Cardiovascular:  S1 and S2 normal no murmurs  Pulmonary/Chest:  Clear to percussion and auscultation  Lymphadenopathy:    She has no cervical adenopathy.  Neurological: She is alert and oriented to person, place, and time.  Psychiatric: She has a normal mood and affect. Her behavior is normal. Judgment and thought content normal.  Vitals reviewed.   Diagnostics:  FVC 2.13 L FEV1 1.63 L. Predicted FVC 2.73 L predicted FEV1 2.15 L-this shows a mild reduction in the forced vital capacity.  Assessment and Plan: 1. Mild intermittent asthma, uncomplicated   2. Other allergic rhinitis       Patient Instructions  Claritin 10 mg once a day instead of Zyrtec Fluticasone 1 spray per nostril twice a day Pro-air-2 puffs every 4 hours if needed for wheezing or coughing spells but if she needs Pro-air more than 2 days per week she will try adding montelukast  10 mg once a day Call me if she's not doing well on  this treatment plan    Return in about 6 months (around 02/14/2016).    Thank you for the opportunity to care for this patient.  Please do not hesitate to contact me with questions.  Penne Lash, M.D.  [facility] [address] [place], [place] [address] [phone]

## 2015-08-17 NOTE — Patient Instructions (Signed)
Claritin 10 mg once a day instead of Zyrtec Fluticasone 1 spray per nostril twice a day Pro-air-2 puffs every 4 hours if needed for wheezing or coughing spells but if she needs Pro-air more than 2 days per week she will try adding montelukast  10 mg once a day Call me if she's not doing well on this treatment plan

## 2015-08-18 ENCOUNTER — Telehealth: Payer: Self-pay

## 2015-08-18 NOTE — Telephone Encounter (Signed)
Patient was seen on Monday by Dr. Shaune Leeks, she informed him that the Azelastine gave her a sore throat. She is wondering what other sprays can she take for sinus headaches.  Please Advise  Thanks

## 2015-08-18 NOTE — Telephone Encounter (Signed)
Called and spoke to patient who had mentioned to Dr Shaune Leeks yesterday during her OV that the Azelastine was giving her a sore throat. Patient does use her Flonase 2 sprays in each nostril daily but is asking if there is another spray that she could use as a replacement for Azelatine. Advised patient that she could do a nasal saline and she said she was doing that twice daily every day but it doesn't seem to help her sinus headaches she gets from time to time. Advised patient that we would speak with Dr Shaune Leeks and once we heard back from him someone would give her a call back.

## 2015-08-19 MED ORDER — AZELASTINE HCL 0.1 % NA SOLN: 1.0000 | Freq: Two times a day (BID) | NASAL | Status: DC

## 2015-08-19 NOTE — Telephone Encounter (Signed)
We could call  azelastine 0.1% and she can try 1 spray per nostril twice a day

## 2015-08-19 NOTE — Telephone Encounter (Signed)
Sent in prescription and called to notified patient.

## 2015-08-31 ENCOUNTER — Encounter: Payer: Self-pay | Admitting: Adult Health

## 2015-08-31 ENCOUNTER — Ambulatory Visit (INDEPENDENT_AMBULATORY_CARE_PROVIDER_SITE_OTHER): Payer: Medicare Other | Admitting: Adult Health

## 2015-08-31 VITALS — BP 122/78 | HR 97 | Temp 100.3°F | Ht 63.0 in | Wt 137.9 lb

## 2015-08-31 DIAGNOSIS — J0140 Acute pansinusitis, unspecified: Secondary | ICD-10-CM | POA: Diagnosis not present

## 2015-08-31 DIAGNOSIS — R059 Cough, unspecified: Secondary | ICD-10-CM

## 2015-08-31 DIAGNOSIS — R05 Cough: Secondary | ICD-10-CM

## 2015-08-31 MED ORDER — HYDROCODONE-HOMATROPINE 5-1.5 MG/5ML PO SYRP: 5.0000 mL | ORAL_SOLUTION | Freq: Three times a day (TID) | ORAL | Status: DC | PRN

## 2015-08-31 MED ORDER — DOXYCYCLINE HYCLATE 100 MG PO CAPS: 100.0000 mg | ORAL_CAPSULE | Freq: Two times a day (BID) | ORAL | Status: DC

## 2015-08-31 NOTE — Progress Notes (Signed)
   Subjective:    Patient ID: Pamela Rich, female    DOB: 05-17-1946, 70 y.o.   MRN: [id number]  HPI  70 year old female who presents to the office for one week of upper respiratory type symptoms.   Started with sore throat, which developed into a cough with sinus pain and pressure. She is unsure of when the fever started, took it last night for the first time and it was 101. She also complains of body aches, fatigue and bilateral ear pain.   She has been trying Mucinex, OTC cold medication, and using Tylenol with minimal relief.     Review of Systems  Constitutional: Positive for fever, activity change, appetite change and fatigue.  HENT: Positive for congestion, ear pain, hearing loss (chronic), postnasal drip, sinus pressure and sore throat. Negative for tinnitus and trouble swallowing.   Eyes: Negative.   Respiratory: Positive for cough. Negative for shortness of breath and wheezing.   Cardiovascular: Negative.   Musculoskeletal: Positive for myalgias. Negative for gait problem, neck pain and neck stiffness.  Neurological: Positive for headaches.  All other systems reviewed and are negative.      Objective:   Physical Exam  Constitutional: She is oriented to person, place, and time. She appears well-developed and well-nourished. No distress.  Appears tired and worn out  HENT:  Head: Normocephalic and atraumatic.  Right Ear: External ear normal.  Left Ear: External ear normal.  Nose: Right sinus exhibits maxillary sinus tenderness and frontal sinus tenderness. Left sinus exhibits maxillary sinus tenderness and frontal sinus tenderness.  Mouth/Throat: Oropharynx is clear and moist. No oropharyngeal exudate.  Trace fluid behind ears  Eyes: Conjunctivae and EOM are normal. Pupils are equal, round, and reactive to light. Left eye exhibits no discharge.  Neck: Normal range of motion. Neck supple.  Cardiovascular: Normal rate, regular rhythm, normal heart sounds and intact  distal pulses.  Exam reveals no gallop and no friction rub.   No murmur heard. Pulmonary/Chest: Effort normal and breath sounds normal. No respiratory distress. She has no wheezes. She has no rales. She exhibits no tenderness.  Lymphadenopathy:    She has no cervical adenopathy.  Neurological: She is alert and oriented to person, place, and time.  Skin: Skin is warm and dry. No rash noted. She is not diaphoretic. No erythema. No pallor.  Psychiatric: She has a normal mood and affect. Her behavior is normal. Judgment and thought content normal.  Nursing note and vitals reviewed.     Assessment & Plan:  1. Acute pansinusitis, recurrence not specified - doxycycline (VIBRAMYCIN) 100 MG capsule; Take 1 capsule (100 mg total) by mouth 2 (two) times daily.  Dispense: 20 capsule; Refill: 0 - Rest and keep hydrated - Can use Mucinex during the day - Tylenol 650mg  Q 8H x 3 days.  - Follow up with PCP if no improvement.  2. Cough - HYDROcodone-homatropine (HYCODAN) 5-1.5 MG/5ML syrup; Take 5 mLs by mouth every 8 (eight) hours as needed for cough.  Dispense: 120 mL; Refill: 0

## 2015-08-31 NOTE — Patient Instructions (Addendum)
It was great meeting you today!  Your exam is consistent with a sinus infection.   I have sent in a prescription for Hycodan cough syrup - take this at night as it will make you sleepy.   Take the antibiotics twice a day for 10 days.   Tylenol 650mg  every 8 hours.   Rest and stay hydrated.   Follow up with PCP if needed.     Sinusitis, Adult Sinusitis is redness, soreness, and inflammation of the paranasal sinuses. Paranasal sinuses are air pockets within the bones of your face. They are located beneath your eyes, in the middle of your forehead, and above your eyes. In healthy paranasal sinuses, mucus is able to drain out, and air is able to circulate through them by way of your nose. However, when your paranasal sinuses are inflamed, mucus and air can become trapped. This can allow bacteria and other germs to grow and cause infection. Sinusitis can develop quickly and last only a short time (acute) or continue over a long period (chronic). Sinusitis that lasts for more than 12 weeks is considered chronic. CAUSES Causes of sinusitis include:  Allergies.  Structural abnormalities, such as displacement of the cartilage that separates your nostrils (deviated septum), which can decrease the air flow through your nose and sinuses and affect sinus drainage.  Functional abnormalities, such as when the small hairs (cilia) that line your sinuses and help remove mucus do not work properly or are not present. SIGNS AND SYMPTOMS Symptoms of acute and chronic sinusitis are the same. The primary symptoms are pain and pressure around the affected sinuses. Other symptoms include:  Upper toothache.  Earache.  Headache.  Bad breath.  Decreased sense of smell and taste.  A cough, which worsens when you are lying flat.  Fatigue.  Fever.  Thick drainage from your nose, which often is green and may contain pus (purulent).  Swelling and warmth over the affected sinuses. DIAGNOSIS Your  health care provider will perform a physical exam. During your exam, your health care provider may perform any of the following to help determine if you have acute sinusitis or chronic sinusitis:  Look in your nose for signs of abnormal growths in your nostrils (nasal polyps).  Tap over the affected sinus to check for signs of infection.  View the inside of your sinuses using an imaging device that has a light attached (endoscope). If your health care provider suspects that you have chronic sinusitis, one or more of the following tests may be recommended:  Allergy tests.  Nasal culture. A sample of mucus is taken from your nose, sent to a lab, and screened for bacteria.  Nasal cytology. A sample of mucus is taken from your nose and examined by your health care provider to determine if your sinusitis is related to an allergy. TREATMENT Most cases of acute sinusitis are related to a viral infection and will resolve on their own within 10 days. Sometimes, medicines are prescribed to help relieve symptoms of both acute and chronic sinusitis. These may include pain medicines, decongestants, nasal steroid sprays, or saline sprays. However, for sinusitis related to a bacterial infection, your health care provider will prescribe antibiotic medicines. These are medicines that will help kill the bacteria causing the infection. Rarely, sinusitis is caused by a fungal infection. In these cases, your health care provider will prescribe antifungal medicine. For some cases of chronic sinusitis, surgery is needed. Generally, these are cases in which sinusitis recurs more than 3 times  per year, despite other treatments. HOME CARE INSTRUCTIONS  Drink plenty of water. Water helps thin the mucus so your sinuses can drain more easily.  Use a humidifier.  Inhale steam 3-4 times a day (for example, sit in the bathroom with the shower running).  Apply a warm, moist washcloth to your face 3-4 times a day, or as  directed by your health care provider.  Use saline nasal sprays to help moisten and clean your sinuses.  Take medicines only as directed by your health care provider.  If you were prescribed either an antibiotic or antifungal medicine, finish it all even if you start to feel better. SEEK IMMEDIATE MEDICAL CARE IF:  You have increasing pain or severe headaches.  You have nausea, vomiting, or drowsiness.  You have swelling around your face.  You have vision problems.  You have a stiff neck.  You have difficulty breathing.   This information is not intended to replace advice given to you by your health care provider. Make sure you discuss any questions you have with your health care provider.   Document Released: 06/20/2005 Document Revised: 07/11/2014 Document Reviewed: 07/05/2011 Elsevier Interactive Patient Education [employer].

## 2015-08-31 NOTE — Progress Notes (Signed)
Pre visit review using our clinic review tool, if applicable. No additional management support is needed unless otherwise documented below in the visit note. 

## 2015-09-02 ENCOUNTER — Other Ambulatory Visit: Payer: Medicare Other

## 2015-10-05 ENCOUNTER — Other Ambulatory Visit: Payer: Self-pay | Admitting: Internal Medicine

## 2015-10-06 ENCOUNTER — Other Ambulatory Visit: Payer: Self-pay | Admitting: Internal Medicine

## 2015-11-04 ENCOUNTER — Other Ambulatory Visit: Payer: Self-pay

## 2015-11-04 ENCOUNTER — Other Ambulatory Visit: Payer: Self-pay | Admitting: Obstetrics & Gynecology

## 2015-11-04 ENCOUNTER — Other Ambulatory Visit: Payer: Self-pay | Admitting: Internal Medicine

## 2015-11-04 DIAGNOSIS — M8580 Other specified disorders of bone density and structure, unspecified site: Secondary | ICD-10-CM

## 2015-11-05 ENCOUNTER — Other Ambulatory Visit: Payer: Self-pay

## 2015-11-05 ENCOUNTER — Ambulatory Visit
Admission: RE | Admit: 2015-11-05 | Discharge: 2015-11-05 | Disposition: A | Discharge status: Home / Self Care | Payer: Medicare Other | Source: Ambulatory Visit | Attending: Obstetrics & Gynecology | Admitting: Obstetrics & Gynecology

## 2015-11-05 ENCOUNTER — Other Ambulatory Visit: Payer: Medicare Other

## 2015-11-05 DIAGNOSIS — Z780 Asymptomatic menopausal state: Secondary | ICD-10-CM | POA: Diagnosis not present

## 2015-11-05 DIAGNOSIS — M8580 Other specified disorders of bone density and structure, unspecified site: Secondary | ICD-10-CM

## 2015-11-05 DIAGNOSIS — M85852 Other specified disorders of bone density and structure, left thigh: Secondary | ICD-10-CM | POA: Diagnosis not present

## 2015-11-05 DIAGNOSIS — Z1231 Encounter for screening mammogram for malignant neoplasm of breast: Secondary | ICD-10-CM

## 2015-11-10 ENCOUNTER — Telehealth: Payer: Self-pay | Admitting: Obstetrics & Gynecology

## 2015-11-10 NOTE — Telephone Encounter (Signed)
Patient was last seen in the office on 07/28/2015 for aex with Dr.Miller. Per OV note patient is to decrease to Vivelle dot 0.05 mg patches 1/2 twice weekly. Spoke with patient. Advised of directions for Vivelle Dot as instructed by Dr.Miller. She is agreeable and verbalizes understanding.  P: Mammogram yearly pap smear 2014. Pap today. Decrease vivelle dot to 0.05mg  patches 1/2 patch twice weekly and Prometrium 100mg  daily. 90 day supply with 1year RF to mail order pharmacy. Pt knows I would like for her to try and taper off of this. She will try this year. Aware of risks of DVT/PE, stroke, MI, and breast cancer Labs with Dr. Quay Burow return annually or prn  Routing to provider for final review. Patient agreeable to disposition. Will close encounter.

## 2015-11-10 NOTE — Telephone Encounter (Signed)
Patient is questioning if she is supposed to cut her estrogen patch in half. Patient said the directions doe not indicate that she is supposed to cut the patch in half.

## 2016-01-26 ENCOUNTER — Ambulatory Visit
Admission: RE | Admit: 2016-01-26 | Discharge: 2016-01-26 | Disposition: A | Discharge status: Home / Self Care | Payer: Medicare Other | Source: Ambulatory Visit

## 2016-01-26 DIAGNOSIS — Z1231 Encounter for screening mammogram for malignant neoplasm of breast: Secondary | ICD-10-CM

## 2016-02-15 ENCOUNTER — Encounter: Payer: Self-pay | Admitting: Allergy and Immunology

## 2016-02-15 ENCOUNTER — Ambulatory Visit: Payer: Medicare Other | Admitting: Pediatrics

## 2016-02-15 ENCOUNTER — Ambulatory Visit (INDEPENDENT_AMBULATORY_CARE_PROVIDER_SITE_OTHER): Payer: Medicare Other | Admitting: Allergy and Immunology

## 2016-02-15 VITALS — BP 118/78 | HR 68 | Temp 97.6°F | Resp 16

## 2016-02-15 DIAGNOSIS — J4520 Mild intermittent asthma, uncomplicated: Secondary | ICD-10-CM | POA: Diagnosis not present

## 2016-02-15 DIAGNOSIS — L820 Inflamed seborrheic keratosis: Secondary | ICD-10-CM | POA: Diagnosis not present

## 2016-02-15 DIAGNOSIS — J3089 Other allergic rhinitis: Secondary | ICD-10-CM

## 2016-02-15 NOTE — Assessment & Plan Note (Signed)
   Continue azelastine nasal spray and fluticasone nasal spray.  I recommended increasing these nasal sprays to twice a day as needed for now.  I have also recommended nasal saline spray (i.e., Simply Saline) or nasal saline lavage (i.e., NeilMed) as needed prior to medicated nasal sprays.

## 2016-02-15 NOTE — Progress Notes (Signed)
Follow-up Note  RE: Pamela Rich MRN: [id number] DOB: Apr 01, 1946 Date of Office Visit: 02/15/2016  Primary care provider: Binnie Rail, MD Referring provider: Binnie Rail, MD  History of present illness: Pamela Rich is a 70 y.o. female with mild persistent asthma and allergic rhinitis presenting today for follow up.  She was last seen in this clinic February 13th, 2017.  She reports that since July she has been experiencing some increased nasal congestion and occasional postnasal drainage.  She believes that these symptoms have been triggered by a "something outside" and notes that her symptoms are reduced while [place].  Fluticasone nasal spray and azelastine nasal spray daily as needed have helped to reduce the symptoms.  She believes that her asthma is relatively well-controlled and has not recently required albuterol rescue. She did not start montelukast because she is concerned about the potential side effect of depression as she has recently recovered from depression secondary to loss of her spouse.     Assessment and plan: Mild intermittent asthma  For now, continue albuterol HFA, 1-2 inhalations every 4-6 hours as needed.  During respiratory tract infections and asthma flares, the patient may add Qvar 40 g, 2 inhalations via spacer device twice a day until symptoms have returned to baseline.  Subjective and objective measures of pulmonary function will be followed and the treatment plan will be adjusted accordingly.  Other allergic rhinitis  Continue azelastine nasal spray and fluticasone nasal spray.  I recommended increasing these nasal sprays to twice a day as needed for now.  I have also recommended nasal saline spray (i.e., Simply Saline) or nasal saline lavage (i.e., NeilMed) as needed prior to medicated nasal sprays.   Diagnositics: Spirometry reveals an FVC of 1.89 L and an FEV1 of 1.49 L (70% predicted) without post  bronchodilator improvement.  Please see scanned spirometry results for details.    Physical examination: Blood pressure 118/78, pulse 68, temperature 97.6 F (36.4 C), temperature source Oral, resp. rate 16, last menstrual period 07/04/2000.  General: Alert, interactive, in no acute distress. HEENT: TMs pearly gray, turbinates moderately edematous without discharge, post-pharynx mildly erythematous. Neck: Supple without lymphadenopathy. Lungs: Clear to auscultation without wheezing, rhonchi or rales. CV: Normal S1, S2 without murmurs. Skin: Warm and dry, without lesions or rashes.  The following portions of the patient's history were reviewed and updated as appropriate: allergies, current medications, past family history, past medical history, past social history, past surgical history and problem list.    Medication List       Accurate as of 02/15/16  1:39 PM. Always use your most recent med list.          albuterol 108 (90 Base) MCG/ACT inhaler Commonly known as:  PROAIR HFA Inhale 2 puffs into the lungs every 4 (four) hours as needed for wheezing. Or coughing spells.   aspirin 81 MG tablet Take 81 mg by mouth daily.   Azelastine HCl 0.15 % Soln Place 2 sprays into both nostrils 2 (two) times daily. For sinus headache.   azelastine 0.1 % nasal spray Commonly known as:  ASTELIN Place 1 spray into both nostrils 2 (two) times daily. Use in each nostril as directed   butalbital-aspirin-caffeine 50-325-40 MG capsule Commonly known as:  FIORINAL TAKE ONE CAPSULE BY MOUTH EVERY 6 HOURS AS NEEDED FOR HEADACHE   doxycycline 100 MG capsule Commonly known as:  VIBRAMYCIN Take 1 capsule (100 mg total) by mouth 2 (two) times  daily.   estradiol 0.05 MG/24HR patch Commonly known as:  VIVELLE-DOT Place 1 patch (0.05 mg total) onto the skin 2 (two) times a week.   fluticasone 50 MCG/ACT nasal spray Commonly known as:  FLONASE Place 2 sprays into both nostrils daily. 2 Sprays per  nostril once a day for stuffy nose or drainage.   HYDROcodone-homatropine 5-1.5 MG/5ML syrup Commonly known as:  HYCODAN Take 5 mLs by mouth every 8 (eight) hours as needed for cough.   levothyroxine 112 MCG tablet Commonly known as:  SYNTHROID, LEVOTHROID TAKE 1 TABLET BY MOUTH EVERY DAY BEFORE BREAKFAST   MULTIVITAMIN PO Take by mouth daily.   pravastatin 20 MG tablet Commonly known as:  PRAVACHOL TAKE 1 TABLET BY MOUTH EVERY DAY   progesterone 100 MG capsule Commonly known as:  PROMETRIUM Take 1 capsule (100 mg total) by mouth daily.   vitamin C 1000 MG tablet Take 1,000 mg by mouth daily.   Vitamin D3 1000 units Caps Take by mouth daily.       Allergies  Allergen Reactions  . Pneumococcal Vaccines     Area weakness, redness, and swelling   . Gabapentin     constipation    I appreciate the opportunity to take part in [place] care. Please do not hesitate to contact me with questions.  Sincerely,   R. Edgar Frisk, MD

## 2016-02-15 NOTE — Assessment & Plan Note (Signed)
   For now, continue albuterol HFA, 1-2 inhalations every 4-6 hours as needed.  During respiratory tract infections and asthma flares, the patient may add Qvar 40 g, 2 inhalations via spacer device twice a day until symptoms have returned to baseline.  Subjective and objective measures of pulmonary function will be followed and the treatment plan will be adjusted accordingly.

## 2016-02-15 NOTE — Patient Instructions (Addendum)
Mild intermittent asthma  For now, continue albuterol HFA, 1-2 inhalations every 4-6 hours as needed.  During respiratory tract infections and asthma flares, the patient may add Qvar 40 g, 2 inhalations via spacer device twice a day until symptoms have returned to baseline.  Subjective and objective measures of pulmonary function will be followed and the treatment plan will be adjusted accordingly.  Other allergic rhinitis  Continue azelastine nasal spray and fluticasone nasal spray.  I recommended increasing these nasal sprays to twice a day as needed for now.  I have also recommended nasal saline spray (i.e., Simply Saline) or nasal saline lavage (i.e., NeilMed) as needed prior to medicated nasal sprays.   Return in about 4 months (around 06/16/2016), or if symptoms worsen or fail to improve.

## 2016-03-24 ENCOUNTER — Ambulatory Visit (INDEPENDENT_AMBULATORY_CARE_PROVIDER_SITE_OTHER): Payer: Medicare Other

## 2016-03-24 DIAGNOSIS — Z23 Encounter for immunization: Secondary | ICD-10-CM | POA: Diagnosis not present

## 2016-04-08 ENCOUNTER — Telehealth: Payer: Self-pay | Admitting: Obstetrics & Gynecology

## 2016-04-08 NOTE — Telephone Encounter (Signed)
Spoke with patient. Patient states she is currently using Estradiol .05 mg patches. She is placing half a patch on Mondays and Thursday. Taking Progesterone 100 mg daily. Reports since changing to using 1/2 patch twice weekly she has been having increased night sweats that wake her up at night. States she is not getting enough rest due to symptoms. Patient placed a whole patch last night to see if this would help relieve her symptoms. Asking if she may increase to using 1 patch twice per week or if her dosage can be increased. Advised I will speak with Dr.Miller and return call with further recommendations. Patient is agreeable.

## 2016-04-08 NOTE — Telephone Encounter (Signed)
Patient is asking to increase the dosage of Estradiol patch due to increase night sweats. Confirmed pharmacy with patient.

## 2016-04-08 NOTE — Telephone Encounter (Signed)
She can increase her estrogen if she desires.  I think she is clearly aware of her risks.

## 2016-04-08 NOTE — Telephone Encounter (Signed)
She and I discussed her estrogen risks at her AEX .  The major risk is for stroke and heart attack.  I'd really like her to be on a lower dose if possible.  Would she consider using something for sleep while she adjusts to this lower dosage?

## 2016-04-08 NOTE — Telephone Encounter (Signed)
Spoke with patient. Advised of message as seen below from [place]. Patient is hesitant to start a sleep aid due to fear of sleeping groggy the next day and not being able to wake up easily. Requesting to know of Dr.Miller's recommendations for sleep medication. Advised I will speak with Dr.Miller and return call. Patient is agreeable.

## 2016-04-11 NOTE — Telephone Encounter (Signed)
Spoke with patient. Advised of message as seen below from [place]. Patient states she has thought about it and does not want to increase her patch just yet. Wants to continue using half a patch twice weekly to see how her body adjusts. Does not want to start on a sleeping pill at this time. She will return call to the office with any further questions or concerns.  Routing to provider for final review. Patient agreeable to disposition. Will close encounter.

## 2016-05-19 DIAGNOSIS — H5712 Ocular pain, left eye: Secondary | ICD-10-CM | POA: Diagnosis not present

## 2016-06-07 ENCOUNTER — Ambulatory Visit (INDEPENDENT_AMBULATORY_CARE_PROVIDER_SITE_OTHER): Payer: Medicare Other | Admitting: Allergy and Immunology

## 2016-06-07 ENCOUNTER — Encounter: Payer: Self-pay | Admitting: Allergy and Immunology

## 2016-06-07 VITALS — BP 128/70 | HR 75 | Temp 98.1°F | Resp 14 | Ht 62.5 in | Wt 141.8 lb

## 2016-06-07 DIAGNOSIS — J0110 Acute frontal sinusitis, unspecified: Secondary | ICD-10-CM

## 2016-06-07 DIAGNOSIS — J4531 Mild persistent asthma with (acute) exacerbation: Secondary | ICD-10-CM

## 2016-06-07 DIAGNOSIS — J3089 Other allergic rhinitis: Secondary | ICD-10-CM

## 2016-06-07 DIAGNOSIS — J019 Acute sinusitis, unspecified: Secondary | ICD-10-CM | POA: Insufficient documentation

## 2016-06-07 NOTE — Patient Instructions (Addendum)
Mild persistent asthma Suboptimal control currently.  Increase Qvar 40 g to 2 inhalations via spacer device twice a day.  Continue albuterol HFA, 1-2 inhalations every 4-6 hours as needed.  The patient has been asked to contact me if her symptoms persist or progress. Otherwise, she may return for follow up in 4 months.  Mild sinusitis  Azelastine nasal spray twice a day and fluticasone nasal spray twice a day.  Nasal saline lavage (NeilMed) has been recommended prior to medicated nasal sprays and as needed along with instructions for proper administration.  For thick post nasal drainage, add guaifenesin [phone] mg (Mucinex)  twice daily as needed with adequate hydration as discussed.  If symptoms persist or progress, we will add prednisone or an appropriate antibiotic.  Pamela Rich is to contact me if her symptoms persist, progress, or if she becomes febrile.   Other allergic rhinitis  Continue appropriate allergen avoidance measures, azelastine nasal spray, and fluticasone nasal spray as needed.   Return in about 4 months (around 10/06/2016), or if symptoms worsen or fail to improve.

## 2016-06-07 NOTE — Assessment & Plan Note (Signed)
   Azelastine nasal spray twice a day and fluticasone nasal spray twice a day.  Nasal saline lavage (NeilMed) has been recommended prior to medicated nasal sprays and as needed along with instructions for proper administration.  For thick post nasal drainage, add guaifenesin [phone] mg (Mucinex)  twice daily as needed with adequate hydration as discussed.  If symptoms persist or progress, we will add prednisone or an appropriate antibiotic.  Pamela Rich is to contact me if her symptoms persist, progress, or if she becomes febrile.

## 2016-06-07 NOTE — Assessment & Plan Note (Deleted)
   Continue appropriate allergen avoidance measures, azelastine nasal spray, and fluticasone nasal spray as needed.

## 2016-06-07 NOTE — Progress Notes (Signed)
Follow-up Note  RE: Pamela Rich MRN: [id number] DOB: [birth year] Date of Office Visit: 06/07/2016  Primary care provider: Binnie Rail, MD Referring provider: Binnie Rail, MD  History of present illness: Pamela Rich is a 70 y.o. female with asthma and allergic rhinitis presenting today for sick visit.  She was last seen in this clinic on 02/15/2016.  Over the past week she has been experiencing increased nasal congestion, thick postnasal drainage, and some sinus pressure.  She has not been experiencing fevers, chills, nor has she been producing discolored mucus.  She reports that the use of azelastine nasal spray and fluticasone nasal spray has certainly helped in the interval since her previous visit, however she still experiencing significant thick postnasal drainage.  She has been requiring albuterol rescue more frequently due to asthma symptoms despite taking Qvar 40 g, one inhalation via spacer device daily.  She is not interested in starting montelukast due to the potential side effect of depression.   Assessment and plan: Mild persistent asthma Suboptimal control currently.  Increase Qvar 40 g to 2 inhalations via spacer device twice a day.  Continue albuterol HFA, 1-2 inhalations every 4-6 hours as needed.  The patient has been asked to contact me if her symptoms persist or progress. Otherwise, she may return for follow up in 4 months.  Mild sinusitis  Azelastine nasal spray twice a day and fluticasone nasal spray twice a day.  Nasal saline lavage (NeilMed) has been recommended prior to medicated nasal sprays and as needed along with instructions for proper administration.  For thick post nasal drainage, add guaifenesin [phone] mg (Mucinex)  twice daily as needed with adequate hydration as discussed.  If symptoms persist or progress, we will add prednisone or an appropriate antibiotic.  Pamela Rich is to contact me if her symptoms persist, progress, or if she  becomes febrile.   Other allergic rhinitis  Continue appropriate allergen avoidance measures, azelastine nasal spray, and fluticasone nasal spray as needed.   Diagnostics: Spirometry reveals an FVC of 2.24 L (83% predicted) with an FEV1 of 1.34 L (63% predicted) with significant (200 mL, 15%) postbronchodilator improvement.  Please see scanned spirometry results for details.    Physical examination: Blood pressure 128/70, pulse 75, temperature 98.1 F (36.7 C), temperature source Oral, resp. rate 14, height 5' 2.5" (1.588 m), weight 141 lb 12.8 oz (64.3 kg), last menstrual period 07/04/2000, SpO2 95 %.  General: Alert, interactive, in no acute distress. HEENT: TMs pearly gray, turbinates edematous with thick discharge, post-pharynx moderately erythematous. Neck: Supple without lymphadenopathy. Lungs: Mildly decreased breath sounds bilaterally without wheezing, rhonchi or rales. CV: Normal S1, S2 without murmurs. Skin: Warm and dry, without lesions or rashes.  The following portions of the patient's history were reviewed and updated as appropriate: allergies, current medications, past family history, past medical history, past social history, past surgical history and problem list.    Medication List       Accurate as of 06/07/16  8:11 PM. Always use your most recent med list.          albuterol 108 (90 Base) MCG/ACT inhaler Commonly known as:  PROAIR HFA Inhale 2 puffs into the lungs every 4 (four) hours as needed for wheezing. Or coughing spells.   aspirin 81 MG tablet Take 81 mg by mouth daily.   Azelastine HCl 0.15 % Soln Place 2 sprays into both nostrils 2 (two) times daily. For sinus headache.   azelastine 0.1 % nasal spray  Commonly known as:  ASTELIN Place 1 spray into both nostrils 2 (two) times daily. Use in each nostril as directed   butalbital-aspirin-caffeine 50-325-40 MG capsule Commonly known as:  FIORINAL TAKE ONE CAPSULE BY MOUTH EVERY 6 HOURS AS NEEDED  FOR HEADACHE   doxycycline 100 MG capsule Commonly known as:  VIBRAMYCIN Take 1 capsule (100 mg total) by mouth 2 (two) times daily.   estradiol 0.05 MG/24HR patch Commonly known as:  VIVELLE-DOT Place 1 patch (0.05 mg total) onto the skin 2 (two) times a week.   fluticasone 50 MCG/ACT nasal spray Commonly known as:  FLONASE Place 2 sprays into both nostrils daily. 2 Sprays per nostril once a day for stuffy nose or drainage.   HYDROcodone-homatropine 5-1.5 MG/5ML syrup Commonly known as:  HYCODAN Take 5 mLs by mouth every 8 (eight) hours as needed for cough.   levothyroxine 112 MCG tablet Commonly known as:  SYNTHROID, LEVOTHROID TAKE 1 TABLET BY MOUTH EVERY DAY BEFORE BREAKFAST   MULTIVITAMIN PO Take by mouth daily.   pravastatin 20 MG tablet Commonly known as:  PRAVACHOL TAKE 1 TABLET BY MOUTH EVERY DAY   progesterone 100 MG capsule Commonly known as:  PROMETRIUM Take 1 capsule (100 mg total) by mouth daily.   vitamin C 1000 MG tablet Take 1,000 mg by mouth daily.   Vitamin D3 1000 units Caps Take by mouth daily.       Allergies  Allergen Reactions  . Pneumococcal Vaccines     Area weakness, redness, and swelling   . Gabapentin     constipation   Review of systems: Review of systems negative except as noted in HPI / PMHx or noted below: Constitutional: Negative.  HENT: Negative.   Eyes: Negative.  Respiratory: Negative.   Cardiovascular: Negative.  Gastrointestinal: Negative.  Genitourinary: Negative.  Musculoskeletal: Negative.  Neurological: Negative.  Endo/Heme/Allergies: Negative.  Cutaneous: Negative.  Past Medical History:  Diagnosis Date  . Allergy    SEASONAL  . Fibroid    no surgery  . Heart murmur   . Hyperlipidemia   . Hypothyroidism   . Migraines   . Mild intermittent asthma 07/20/2015  . Osteopenia   . Vitamin D deficiency     Family History  Problem Relation Age of Onset  . Hypertension Mother   . Heart disease Mother      CHF; AS  . Osteoporosis Mother   . Heart attack Mother 23  . Allergic rhinitis Mother   . Diabetes Father   . Stroke Father     onset in 56s  . Cancer Brother     malignant brain tumor  . Colon cancer Neg Hx   . Esophageal cancer Neg Hx   . Rectal cancer Neg Hx   . Stomach cancer Neg Hx   . Angioedema Neg Hx   . Asthma Neg Hx   . Eczema Neg Hx   . Immunodeficiency Neg Hx   . Urticaria Neg Hx     Social History   Social History  . Marital status: Widowed    Spouse name: N/A  . Number of children: 3  . Years of education: N/A   Occupational History  . retired    Social History Main Topics  . Smoking status: Former Smoker    Packs/day: 0.30    Years: 40.00    Types: Cigarettes    Quit date: 07/04/1974  . Smokeless tobacco: Never Used     Comment: smoked 1965-1976 , < 3 cig/ day  .  Alcohol use No     Comment: rarely  . Drug use: No  . Sexual activity: No     Comment: BTL   Other Topics Concern  . Not on file   Social History Narrative   Exercise: walking, stretching, weights    I appreciate the opportunity to take part in [facility]. Please do not hesitate to contact me with questions.  Sincerely,   R. Edgar Frisk, MD

## 2016-06-07 NOTE — Assessment & Plan Note (Addendum)
Suboptimal control currently.  Increase Qvar 40 g to 2 inhalations via spacer device twice a day.  Continue albuterol HFA, 1-2 inhalations every 4-6 hours as needed.  The patient has been asked to contact me if her symptoms persist or progress. Otherwise, she may return for follow up in 4 months.

## 2016-06-07 NOTE — Assessment & Plan Note (Signed)
   Continue appropriate allergen avoidance measures, azelastine nasal spray, and fluticasone nasal spray as needed.

## 2016-06-08 ENCOUNTER — Ambulatory Visit: Payer: Medicare Other | Admitting: Internal Medicine

## 2016-06-12 ENCOUNTER — Other Ambulatory Visit: Payer: Self-pay | Admitting: Pediatrics

## 2016-06-14 ENCOUNTER — Ambulatory Visit: Payer: Medicare Other | Admitting: Allergy and Immunology

## 2016-06-20 NOTE — Progress Notes (Signed)
Pre visit review using our clinic review tool, if applicable. No additional management support is needed unless otherwise documented below in the visit note. 

## 2016-06-20 NOTE — Progress Notes (Addendum)
Subjective:   Pamela Rich is a 70 y.o. female who presents for Medicare Annual (Subsequent) preventive examination.  The Patient was informed that the wellness visit is to identify future health risk and educate and initiate measures that can reduce risk for increased disease through the lifespan.   Describes health as fair, good or great? "good. It would be great if it weren't for my allergy/asthma and knee pain"  Review of Systems:  No ROS.  Medicare Wellness Visit.  Cardiac Risk Factors include: advanced age (>64men, >8 women);dyslipidemia;family history of premature cardiovascular disease;Other (see comment) (allergies and asthma limit outdoor exercise. Joining [employer] in 07/2016. ) Sleep patterns: Sleeps 6-7 hours.  Home Safety/Smoke Alarms:  Smoke detectors and security in place.  Living environment; residence and Firearm Safety: Lives alone in 2 story home, up stairs rarely. No firearms.  Seat Belt Safety/Bike Helmet: Wears seat belt.    Counseling:   Eye Exam-Last exam 07/2015, Followed by [facility] yearly.  Dental-Last exam 05/2016. Every 6 months by Tye Savoy.  Female:   Pap-07/28/2015, normal. Followed by GYN.        Mammo-01/26/2016, negative.      Dexa scan-11/05/2015, osteopenia.       CCS-colonoscopy 12/25/2012, normal. Recall 10 years.      Objective:     Vitals: BP 132/78 (BP Location: Left Arm, Patient Position: Sitting, Cuff Size: Normal)   Pulse 66   Resp 18   Ht 5\' 3"  (1.6 m)   Wt 140 lb 1.3 oz (63.5 kg)   LMP 07/04/2000   SpO2 95%   BMI 24.81 kg/m   Body mass index is 24.81 kg/m.   Tobacco History  Smoking Status  . Former Smoker  . Packs/day: 0.30  . Years: 40.00  . Types: Cigarettes  . Quit date: 07/04/1974  Smokeless Tobacco  . Never Used    Comment: smoked 1965-1976 , < 3 cig/ day     Counseling given: No   Past Medical History:  Diagnosis Date  . Allergy    SEASONAL  . Fibroid    no surgery  . Heart murmur   .  Hyperlipidemia   . Hypothyroidism   . Migraines   . Mild intermittent asthma 07/20/2015  . Osteopenia   . Vitamin D deficiency    Past Surgical History:  Procedure Laterality Date  . COLONOSCOPY  2014   negative , Dr Ardis Hughs  . cortisone injections     in left hand  . LUMBAR FUSION     Dr Hal Neer  . POLYPECTOMY     uterine  . SEPTOPLASTY    . SINOSCOPY    . TUBAL LIGATION     Family History  Problem Relation Age of Onset  . Hypertension Mother   . Heart disease Mother     CHF; AS  . Osteoporosis Mother   . Heart attack Mother 61  . Allergic rhinitis Mother   . Diabetes Father   . Stroke Father     onset in 69s  . Cancer Brother     malignant brain tumor  . Colon cancer Neg Hx   . Esophageal cancer Neg Hx   . Rectal cancer Neg Hx   . Stomach cancer Neg Hx   . Angioedema Neg Hx   . Asthma Neg Hx   . Eczema Neg Hx   . Immunodeficiency Neg Hx   . Urticaria Neg Hx    History  Sexual Activity  . Sexual activity: No  Comment: BTL    Outpatient Encounter Prescriptions as of 06/21/2016  Medication Sig  . albuterol (PROAIR HFA) 108 (90 Base) MCG/ACT inhaler Inhale 2 puffs into the lungs every 4 (four) hours as needed for wheezing. Or coughing spells. (Patient taking differently: Inhale 2 puffs into the lungs every 4 (four) hours as needed for wheezing (using 1 puff daily prn). Or coughing spells.)  . Ascorbic Acid (VITAMIN C) 1000 MG tablet Take 1,000 mg by mouth daily.    Marland Kitchen aspirin 81 MG tablet Take 81 mg by mouth daily.  Marland Kitchen azelastine (ASTELIN) 0.1 % nasal spray Place 1 spray into both nostrils 2 (two) times daily. Use in each nostril as directed  . butalbital-aspirin-caffeine (FIORINAL) 50-325-40 MG per capsule TAKE ONE CAPSULE BY MOUTH EVERY 6 HOURS AS NEEDED FOR HEADACHE  . Cholecalciferol (VITAMIN D3) 1000 UNITS CAPS Take by mouth daily.    Marland Kitchen estradiol (VIVELLE-DOT) 0.05 MG/24HR patch Place 1 patch (0.05 mg total) onto the skin 2 (two) times a week.  .  fluticasone (FLONASE) 50 MCG/ACT nasal spray USE 2 SPRAYS IN EACH NOSTRIL EVERY DAY FOR STUFFY NOSE OR DRAINAGE  . levothyroxine (SYNTHROID, LEVOTHROID) 112 MCG tablet TAKE 1 TABLET BY MOUTH EVERY DAY BEFORE BREAKFAST  . Multiple Vitamin (MULTIVITAMIN PO) Take by mouth daily.    . pravastatin (PRAVACHOL) 20 MG tablet TAKE 1 TABLET BY MOUTH EVERY DAY  . progesterone (PROMETRIUM) 100 MG capsule Take 1 capsule (100 mg total) by mouth daily.  . Azelastine HCl 0.15 % SOLN Place 2 sprays into both nostrils 2 (two) times daily. For sinus headache. (Patient not taking: Reported on 06/21/2016)  . [DISCONTINUED] doxycycline (VIBRAMYCIN) 100 MG capsule Take 1 capsule (100 mg total) by mouth 2 (two) times daily. (Patient not taking: Reported on 06/07/2016)  . [DISCONTINUED] HYDROcodone-homatropine (HYCODAN) 5-1.5 MG/5ML syrup Take 5 mLs by mouth every 8 (eight) hours as needed for cough. (Patient not taking: Reported on 06/07/2016)   No facility-administered encounter medications on file as of 06/21/2016.     Activities of Daily Living In your present state of health, do you have any difficulty performing the following activities: 06/21/2016  Hearing? N  Vision? N  Difficulty concentrating or making decisions? N  Walking or climbing stairs? N  Dressing or bathing? N  Doing errands, shopping? N  Preparing Food and eating ? N  Using the Toilet? N  In the past six months, have you accidently leaked urine? N  Do you have problems with loss of bowel control? N  Managing your Medications? N  Managing your Finances? N  Housekeeping or managing your Housekeeping? N  Some recent data might be hidden    Patient Care Team: Binnie Rail, MD as PCP - General (Internal Medicine) Adelina Mings, MD as Consulting Physician (Allergy and Immunology) Megan Salon, MD as Consulting Physician (Gynecology) Danella Sensing, MD as Consulting Physician (Dermatology) Ralene Bathe, MD as Consulting Physician  (Ophthalmology)    Assessment:    Physical assessment deferred to PCP.  Exercise Activities and Dietary recommendations Current Exercise Habits: Home exercise routine, Type of exercise: walking, Time (Minutes): 45, Frequency (Times/Week): 3, Weekly Exercise (Minutes/Week): 135, Intensity: Moderate, Exercise limited by: respiratory conditions(s)   Diet (meal preparation, eat out, water intake, caffeinated beverages, dairy products, fruits and vegetables): Drinks water and tea.   Breakfast: Eggs, canadian/turkey bacon, oatmeal, toast, waffle, grapefruit, coffee  Lunch: Fruit cottage cheese salad Dinner: Eats out, food bar at [employer]. Lean meat, vegetables.  Encouraged to continue making healthy choices and exercise.   Goals      Patient Stated   . patient (pt-stated)          More strength training with stretch bands, low weights, go slow and stop with pain  Adapting to change is ongoing; will adapt some stress reduction;   Vacation an little more; Go on vacation x 2 weeks;       Other   . Exercise 150 minutes per week (moderate activity)          Increase exercise by joining [employer] at [facility].       Fall Risk Fall Risk  06/21/2016 05/06/2015 03/14/2013  Falls in the past year? No No No   Depression Screen PHQ 2/9 Scores 06/21/2016 05/06/2015 03/14/2013  PHQ - 2 Score 0 0 0     Cognitive Function       Ad8 score reviewed for issues:  Issues making decisions:no  Less interest in hobbies / activities:no  Repeats questions, stories (family complaining):no  Trouble using ordinary gadgets (microwave, computer, phone):no  Forgets the month or year: no  Mismanaging finances: no  Remembering appts:no  Daily problems with thinking and/or memory:no Ad8 score is=0     Immunization History  Administered Date(s) Administered  . Influenza Split 04/06/2011, 04/18/2012  . Influenza Whole 04/18/2007, 04/23/2008, 04/08/2009, 03/26/2010  . Influenza,  High Dose Seasonal PF 04/16/2013, 03/24/2016  . Influenza,inj,Quad PF,36+ Mos 03/18/2014, 03/24/2015  . Pneumococcal Conjugate-13 11/04/2014  . Pneumococcal Polysaccharide-23 01/22/2007, 11/12/2012  . Tdap 07/26/2012  . Zoster 09/24/2007   Screening Tests Health Maintenance  Topic Date Due  . MAMMOGRAM  01/25/2018  . TETANUS/TDAP  07/26/2022  . COLONOSCOPY  12/26/2022  . INFLUENZA VACCINE  Completed  . DEXA SCAN  Completed  . ZOSTAVAX  Completed  . Hepatitis C Screening  Completed  . PNA vac Low Risk Adult  Completed      Plan:     Continue to eat heart healthy diet (full of fruits, vegetables, whole grains, lean protein, water--limit salt, fat, and sugar intake) and increase physical activity as tolerated. Have fun at [employer]!!  Continue doing brain stimulating activities (puzzles, reading, adult coloring books, staying active) to keep memory sharp.    During the course of the visit the patient was educated and counseled about the following appropriate screening and preventive services:   Vaccines to include Pneumoccal, Influenza, Hepatitis B, Td, Zostavax, HCV  Cardiovascular Disease  Colorectal cancer screening  Bone density screening  Diabetes screening  Glaucoma screening  Mammography/PAP  Nutrition counseling   Patient Instructions (the written plan) was given to the patient.   Gerilyn Nestle, RN  06/21/2016   Medical screening examination/treatment/procedure(s) were performed by non-physician practitioner and as supervising physician I was immediately available for consultation/collaboration. I agree with above. Binnie Rail, MD

## 2016-06-21 ENCOUNTER — Ambulatory Visit (INDEPENDENT_AMBULATORY_CARE_PROVIDER_SITE_OTHER): Payer: Medicare Other | Admitting: Internal Medicine

## 2016-06-21 ENCOUNTER — Encounter: Payer: Self-pay | Admitting: Internal Medicine

## 2016-06-21 VITALS — BP 132/78 | HR 66 | Resp 18 | Ht 63.0 in | Wt 140.1 lb

## 2016-06-21 DIAGNOSIS — G43009 Migraine without aura, not intractable, without status migrainosus: Secondary | ICD-10-CM

## 2016-06-21 DIAGNOSIS — G8929 Other chronic pain: Secondary | ICD-10-CM

## 2016-06-21 DIAGNOSIS — M25561 Pain in right knee: Secondary | ICD-10-CM

## 2016-06-21 DIAGNOSIS — E782 Mixed hyperlipidemia: Secondary | ICD-10-CM | POA: Diagnosis not present

## 2016-06-21 DIAGNOSIS — M25562 Pain in left knee: Secondary | ICD-10-CM

## 2016-06-21 DIAGNOSIS — M8580 Other specified disorders of bone density and structure, unspecified site: Secondary | ICD-10-CM

## 2016-06-21 DIAGNOSIS — Z0000 Encounter for general adult medical examination without abnormal findings: Secondary | ICD-10-CM

## 2016-06-21 DIAGNOSIS — E039 Hypothyroidism, unspecified: Secondary | ICD-10-CM

## 2016-06-21 NOTE — Patient Instructions (Addendum)
Continue to eat heart healthy diet (full of fruits, vegetables, whole grains, lean protein, water--limit salt, fat, and sugar intake) and increase physical activity as tolerated. Have fun at [employer]!!  Continue doing brain stimulating activities (puzzles, reading, adult coloring books, staying active) to keep memory sharp.    Test(s) ordered today. Your results will be released to [place] (or called to you) after review, usually within 72hours after test completion. If any changes need to be made, you will be notified at that same time.  All other Health Maintenance issues reviewed.   All recommended immunizations and age-appropriate screenings are up-to-date or discussed.  No immunizations administered today.   Medications reviewed and updated.  No changes recommended at this time.  Your prescription(s) have been submitted to your pharmacy. Please take as directed and contact our office if you believe you are having problem(s) with the medication(s).  A referral for orthopedics was ordered.  Please followup in one year for a physical     Fall Prevention in the Home Introduction Falls can cause injuries. They can happen to people of all ages. There are many things you can do to make your home safe and to help prevent falls. What can I do on the outside of my home?  Regularly fix the edges of walkways and driveways and fix any cracks.  Remove anything that might make you trip as you walk through a door, such as a raised step or threshold.  Trim any bushes or trees on the path to your home.  Use bright outdoor lighting.  Clear any walking paths of anything that might make someone trip, such as rocks or tools.  Regularly check to see if handrails are loose or broken. Make sure that both sides of any steps have handrails.  Any raised decks and porches should have guardrails on the edges.  Have any leaves, snow, or ice cleared regularly.  Use sand or salt on walking  paths during winter.  Clean up any spills in your garage right away. This includes oil or grease spills. What can I do in the bathroom?  Use night lights.  Install grab bars by the toilet and in the tub and shower. Do not use towel bars as grab bars.  Use non-skid mats or decals in the tub or shower.  If you need to sit down in the shower, use a plastic, non-slip stool.  Keep the floor dry. Clean up any water that spills on the floor as soon as it happens.  Remove soap buildup in the tub or shower regularly.  Attach bath mats securely with double-sided non-slip rug tape.  Do not have throw rugs and other things on the floor that can make you trip. What can I do in the bedroom?  Use night lights.  Make sure that you have a light by your bed that is easy to reach.  Do not use any sheets or blankets that are too big for your bed. They should not hang down onto the floor.  Have a firm chair that has side arms. You can use this for support while you get dressed.  Do not have throw rugs and other things on the floor that can make you trip. What can I do in the kitchen?  Clean up any spills right away.  Avoid walking on wet floors.  Keep items that you use a lot in easy-to-reach places.  If you need to reach something above you, use a strong step stool that  has a grab bar.  Keep electrical cords out of the way.  Do not use floor polish or wax that makes floors slippery. If you must use wax, use non-skid floor wax.  Do not have throw rugs and other things on the floor that can make you trip. What can I do with my stairs?  Do not leave any items on the stairs.  Make sure that there are handrails on both sides of the stairs and use them. Fix handrails that are broken or loose. Make sure that handrails are as long as the stairways.  Check any carpeting to make sure that it is firmly attached to the stairs. Fix any carpet that is loose or worn.  Avoid having throw rugs at  the top or bottom of the stairs. If you do have throw rugs, attach them to the floor with carpet tape.  Make sure that you have a light switch at the top of the stairs and the bottom of the stairs. If you do not have them, ask someone to add them for you. What else can I do to help prevent falls?  Wear shoes that:  Do not have high heels.  Have rubber bottoms.  Are comfortable and fit you well.  Are closed at the toe. Do not wear sandals.  If you use a stepladder:  Make sure that it is fully opened. Do not climb a closed stepladder.  Make sure that both sides of the stepladder are locked into place.  Ask someone to hold it for you, if possible.  Clearly mark and make sure that you can see:  Any grab bars or handrails.  First and last steps.  Where the edge of each step is.  Use tools that help you move around (mobility aids) if they are needed. These include:  Canes.  Walkers.  Scooters.  Crutches.  Turn on the lights when you go into a dark area. Replace any light bulbs as soon as they burn out.  Set up your furniture so you have a clear path. Avoid moving your furniture around.  If any of your floors are uneven, fix them.  If there are any pets around you, be aware of where they are.  Review your medicines with your doctor. Some medicines can make you feel dizzy. This can increase your chance of falling. Ask your doctor what other things that you can do to help prevent falls. This information is not intended to replace advice given to you by your health care provider. Make sure you discuss any questions you have with your health care provider. Document Released: 04/16/2009 Document Revised: 11/26/2015 Document Reviewed: 07/25/2014  2017 [employer]  Health Maintenance, Female Introduction Adopting a healthy lifestyle and getting preventive care can go a long way to promote health and wellness. Talk with your health care provider about what schedule of regular  examinations is right for you. This is a good chance for you to check in with your provider about disease prevention and staying healthy. In between checkups, there are plenty of things you can do on your own. Experts have done a lot of research about which lifestyle changes and preventive measures are most likely to keep you healthy. Ask your health care provider for more information. Weight and diet Eat a healthy diet  Be sure to include plenty of vegetables, fruits, low-fat dairy products, and lean protein.  Do not eat a lot of foods high in solid fats, added sugars, or salt.  Get  regular exercise. This is one of the most important things you can do for your health.  Most adults should exercise for at least 150 minutes each week. The exercise should increase your heart rate and make you sweat (moderate-intensity exercise).  Most adults should also do strengthening exercises at least twice a week. This is in addition to the moderate-intensity exercise. Maintain a healthy weight  Body mass index (BMI) is a measurement that can be used to identify possible weight problems. It estimates body fat based on height and weight. Your health care provider can help determine your BMI and help you achieve or maintain a healthy weight.  For females 29 years of age and older:  A BMI below 18.5 is considered underweight.  A BMI of 18.5 to 24.9 is normal.  A BMI of 25 to 29.9 is considered overweight.  A BMI of 30 and above is considered obese. Watch levels of cholesterol and blood lipids  You should start having your blood tested for lipids and cholesterol at 70 years of age, then have this test every 5 years.  You may need to have your cholesterol levels checked more often if:  Your lipid or cholesterol levels are high.  You are older than 70 years of age.  You are at high risk for heart disease. Cancer screening Lung Cancer  Lung cancer screening is recommended for adults 52-80 years  old who are at high risk for lung cancer because of a history of smoking.  A yearly low-dose CT scan of the lungs is recommended for people who:  Currently smoke.  Have quit within the past 15 years.  Have at least a 30-pack-year history of smoking. A pack year is smoking an average of one pack of cigarettes a day for 1 year.  Yearly screening should continue until it has been 15 years since you quit.  Yearly screening should stop if you develop a health problem that would prevent you from having lung cancer treatment. Breast Cancer  Practice breast self-awareness. This means understanding how your breasts normally appear and feel.  It also means doing regular breast self-exams. Let your health care provider know about any changes, no matter how small.  If you are in your 20s or 30s, you should have a clinical breast exam (CBE) by a health care provider every 1-3 years as part of a regular health exam.  If you are 108 or older, have a CBE every year. Also consider having a breast X-ray (mammogram) every year.  If you have a family history of breast cancer, talk to your health care provider about genetic screening.  If you are at high risk for breast cancer, talk to your health care provider about having an MRI and a mammogram every year.  Breast cancer gene (BRCA) assessment is recommended for women who have family members with BRCA-related cancers. BRCA-related cancers include:  Breast.  Ovarian.  Tubal.  Peritoneal cancers.  Results of the assessment will determine the need for genetic counseling and BRCA1 and BRCA2 testing. Cervical Cancer  Your health care provider may recommend that you be screened regularly for cancer of the pelvic organs (ovaries, uterus, and vagina). This screening involves a pelvic examination, including checking for microscopic changes to the surface of your cervix (Pap test). You may be encouraged to have this screening done every 3 years, beginning  at age 52.  For women ages 50-65, health care providers may recommend pelvic exams and Pap testing every 3 years, or  they may recommend the Pap and pelvic exam, combined with testing for human papilloma virus (HPV), every 5 years. Some types of HPV increase your risk of cervical cancer. Testing for HPV may also be done on women of any age with unclear Pap test results.  Other health care providers may not recommend any screening for nonpregnant women who are considered low risk for pelvic cancer and who do not have symptoms. Ask your health care provider if a screening pelvic exam is right for you.  If you have had past treatment for cervical cancer or a condition that could lead to cancer, you need Pap tests and screening for cancer for at least 20 years after your treatment. If Pap tests have been discontinued, your risk factors (such as having a new sexual partner) need to be reassessed to determine if screening should resume. Some women have medical problems that increase the chance of getting cervical cancer. In these cases, your health care provider may recommend more frequent screening and Pap tests. Colorectal Cancer  This type of cancer can be detected and often prevented.  Routine colorectal cancer screening usually begins at 70 years of age and continues through 70 years of age.  Your health care provider may recommend screening at an earlier age if you have risk factors for colon cancer.  Your health care provider may also recommend using home test kits to check for hidden blood in the stool.  A small camera at the end of a tube can be used to examine your colon directly (sigmoidoscopy or colonoscopy). This is done to check for the earliest forms of colorectal cancer.  Routine screening usually begins at age 55.  Direct examination of the colon should be repeated every 5-10 years through 70 years of age. However, you may need to be screened more often if early forms of precancerous  polyps or small growths are found. Skin Cancer  Check your skin from head to toe regularly.  Tell your health care provider about any new moles or changes in moles, especially if there is a change in a mole's shape or color.  Also tell your health care provider if you have a mole that is larger than the size of a pencil eraser.  Always use sunscreen. Apply sunscreen liberally and repeatedly throughout the day.  Protect yourself by wearing long sleeves, pants, a wide-brimmed hat, and sunglasses whenever you are outside. Heart disease, diabetes, and high blood pressure  High blood pressure causes heart disease and increases the risk of stroke. High blood pressure is more likely to develop in:  People who have blood pressure in the high end of the normal range (130-139/85-89 mm Hg).  People who are overweight or obese.  People who are African American.  If you are 40-43 years of age, have your blood pressure checked every 3-5 years. If you are 25 years of age or older, have your blood pressure checked every year. You should have your blood pressure measured twice-once when you are at a hospital or clinic, and once when you are not at a hospital or clinic. Record the average of the two measurements. To check your blood pressure when you are not at a hospital or clinic, you can use:  An automated blood pressure machine at a pharmacy.  A home blood pressure monitor.  If you are between 49 years and 19 years old, ask your health care provider if you should take aspirin to prevent strokes.  Have regular diabetes screenings.  This involves taking a blood sample to check your fasting blood sugar level.  If you are at a normal weight and have a low risk for diabetes, have this test once every three years after 70 years of age.  If you are overweight and have a high risk for diabetes, consider being tested at a younger age or more often. Preventing infection Hepatitis B  If you have a higher  risk for hepatitis B, you should be screened for this virus. You are considered at high risk for hepatitis B if:  You were born in a country where hepatitis B is common. Ask your health care provider which countries are considered high risk.  Your parents were born in a high-risk country, and you have not been immunized against hepatitis B (hepatitis B vaccine).  You have HIV or AIDS.  You use needles to inject street drugs.  You live with someone who has hepatitis B.  You have had sex with someone who has hepatitis B.  You get hemodialysis treatment.  You take certain medicines for conditions, including cancer, organ transplantation, and autoimmune conditions. Hepatitis C  Blood testing is recommended for:  Everyone born from [birth year] through 1965.  Anyone with known risk factors for hepatitis C. Sexually transmitted infections (STIs)  You should be screened for sexually transmitted infections (STIs) including gonorrhea and chlamydia if:  You are sexually active and are younger than 70 years of age.  You are older than 70 years of age and your health care provider tells you that you are at risk for this type of infection.  Your sexual activity has changed since you were last screened and you are at an increased risk for chlamydia or gonorrhea. Ask your health care provider if you are at risk.  If you do not have HIV, but are at risk, it may be recommended that you take a prescription medicine daily to prevent HIV infection. This is called pre-exposure prophylaxis (PrEP). You are considered at risk if:  You are sexually active and do not regularly use condoms or know the HIV status of your partner(s).  You take drugs by injection.  You are sexually active with a partner who has HIV. Talk with your health care provider about whether you are at high risk of being infected with HIV. If you choose to begin PrEP, you should first be tested for HIV. You should then be tested every 3  months for as long as you are taking PrEP. Pregnancy  If you are premenopausal and you may become pregnant, ask your health care provider about preconception counseling.  If you may become pregnant, take 400 to 800 micrograms (mcg) of folic acid every day.  If you want to prevent pregnancy, talk to your health care provider about birth control (contraception). Osteoporosis and menopause  Osteoporosis is a disease in which the bones lose minerals and strength with aging. This can result in serious bone fractures. Your risk for osteoporosis can be identified using a bone density scan.  If you are 42 years of age or older, or if you are at risk for osteoporosis and fractures, ask your health care provider if you should be screened.  Ask your health care provider whether you should take a calcium or vitamin D supplement to lower your risk for osteoporosis.  Menopause may have certain physical symptoms and risks.  Hormone replacement therapy may reduce some of these symptoms and risks. Talk to your health care provider about whether hormone replacement  therapy is right for you. Follow these instructions at home:  Schedule regular health, dental, and eye exams.  Stay current with your immunizations.  Do not use any tobacco products including cigarettes, chewing tobacco, or electronic cigarettes.  If you are pregnant, do not drink alcohol.  If you are breastfeeding, limit how much and how often you drink alcohol.  Limit alcohol intake to no more than 1 drink per day for nonpregnant women. One drink equals 12 ounces of beer, 5 ounces of wine, or 1 ounces of hard liquor.  Do not use street drugs.  Do not share needles.  Ask your health care provider for help if you need support or information about quitting drugs.  Tell your health care provider if you often feel depressed.  Tell your health care provider if you have ever been abused or do not feel safe at home. This information is  not intended to replace advice given to you by your health care provider. Make sure you discuss any questions you have with your health care provider. Document Released: 01/03/2011 Document Revised: 11/26/2015 Document Reviewed: 03/24/2015  2017 [employer]

## 2016-06-21 NOTE — Progress Notes (Signed)
Pre visit review using our clinic review tool, if applicable. No additional management support is needed unless otherwise documented below in the visit note. 

## 2016-06-21 NOTE — Assessment & Plan Note (Signed)
Has not had a migraine in a long time fiorinal as needed

## 2016-06-21 NOTE — Assessment & Plan Note (Signed)
Worse with cold weather Right knee feels like it will give out at times Wants further evaluation Refer to [facility]

## 2016-06-21 NOTE — Assessment & Plan Note (Signed)
Check tsh  Titrate med dose if needed  

## 2016-06-21 NOTE — Assessment & Plan Note (Signed)
dexa up to date 

## 2016-06-21 NOTE — Assessment & Plan Note (Signed)
Check lipid panel  Continue daily statin Regular exercise and healthy diet encouraged  

## 2016-06-21 NOTE — Progress Notes (Signed)
Subjective:    Patient ID: Pamela Rich, female    DOB: 1945/10/17, 70 y.o.   MRN: [id number]  HPI She is here for follow up with me and for a wellness visit with our health coach.  Right knee pain:  Her knee feels like it wants to give way shen she goes up and down stairs.  It is more painful since the colder weather. The left knee hurts as well but not as bad.  Years ago she fell on her right knee.  She lives on one level, but has one room upstairs that she does not have to go up to often.  She wants to have it evaluated further.    Migraine headaches:  She has not had a migraine in a while.  She has not needed the medication, but has it hat home just in case.   Hypothyroidism:  She is taking her medication daily.  She denies any recent changes in energy or weight that are unexplained.   Hyperlipidemia: She is taking her medication daily. She is compliant with a low fat/cholesterol diet. She is exercising regularly. She denies myalgias.     Medications and allergies reviewed with patient and updated if appropriate.  Patient Active Problem List   Diagnosis Date Noted  . Chronic pain of both knees 06/21/2016  . Mild sinusitis 06/07/2016  . Mild persistent asthma 07/20/2015  . Other allergic rhinitis 07/20/2015  . Mixed hyperlipidemia 03/01/2012  . Migraine without aura 04/05/2010  . FACIAL PARESTHESIA, LEFT 10/16/2009  . Vitamin D deficiency 03/19/2009  . DEGENERATIVE JOINT DISEASE 12/05/2007  . NOCTURIA 12/05/2007  . Hypothyroidism 01/22/2007  . IBS 09/28/2006  . Osteopenia 09/28/2006    Current Outpatient Prescriptions on File Prior to Visit  Medication Sig Dispense Refill  . albuterol (PROAIR HFA) 108 (90 Base) MCG/ACT inhaler Inhale 2 puffs into the lungs every 4 (four) hours as needed for wheezing. Or coughing spells. (Patient taking differently: Inhale 2 puffs into the lungs every 4 (four) hours as needed for wheezing (using 1 puff daily prn). Or coughing spells.) 1  Inhaler 1  . Ascorbic Acid (VITAMIN C) 1000 MG tablet Take 1,000 mg by mouth daily.      Marland Kitchen aspirin 81 MG tablet Take 81 mg by mouth daily.    Marland Kitchen azelastine (ASTELIN) 0.1 % nasal spray Place 1 spray into both nostrils 2 (two) times daily. Use in each nostril as directed 30 mL 5  . butalbital-aspirin-caffeine (FIORINAL) 50-325-40 MG per capsule TAKE ONE CAPSULE BY MOUTH EVERY 6 HOURS AS NEEDED FOR HEADACHE 30 capsule 0  . Cholecalciferol (VITAMIN D3) 1000 UNITS CAPS Take by mouth daily.      Marland Kitchen estradiol (VIVELLE-DOT) 0.05 MG/24HR patch Place 1 patch (0.05 mg total) onto the skin 2 (two) times a week. 24 patch 4  . fluticasone (FLONASE) 50 MCG/ACT nasal spray USE 2 SPRAYS IN EACH NOSTRIL EVERY DAY FOR STUFFY NOSE OR DRAINAGE 16 g 5  . levothyroxine (SYNTHROID, LEVOTHROID) 112 MCG tablet TAKE 1 TABLET BY MOUTH EVERY DAY BEFORE BREAKFAST 30 tablet 2  . Multiple Vitamin (MULTIVITAMIN PO) Take by mouth daily.      . pravastatin (PRAVACHOL) 20 MG tablet TAKE 1 TABLET BY MOUTH EVERY DAY 90 tablet 2  . progesterone (PROMETRIUM) 100 MG capsule Take 1 capsule (100 mg total) by mouth daily. 90 capsule 4   No current facility-administered medications on file prior to visit.     Past Medical History:  Diagnosis  Date  . Allergy    SEASONAL  . Fibroid    no surgery  . Heart murmur   . Hyperlipidemia   . Hypothyroidism   . Migraines   . Mild intermittent asthma 07/20/2015  . Osteopenia   . Vitamin D deficiency     Past Surgical History:  Procedure Laterality Date  . COLONOSCOPY  2014   negative , Dr Ardis Hughs  . cortisone injections     in left hand  . LUMBAR FUSION     Dr Hal Neer  . POLYPECTOMY     uterine  . SEPTOPLASTY    . SINOSCOPY    . TUBAL LIGATION      Social History   Social History  . Marital status: Widowed    Spouse name: N/A  . Number of children: 3  . Years of education: N/A   Occupational History  . retired    Social History Main Topics  . Smoking status: Former  Smoker    Packs/day: 0.30    Years: 40.00    Types: Cigarettes    Quit date: 07/04/1974  . Smokeless tobacco: Never Used     Comment: smoked 1965-1976 , < 3 cig/ day  . Alcohol use No     Comment: rarely  . Drug use: No  . Sexual activity: No     Comment: BTL   Other Topics Concern  . None   Social History Narrative   Exercise: walking, stretching, weights    Family History  Problem Relation Age of Onset  . Hypertension Mother   . Heart disease Mother     CHF; AS  . Osteoporosis Mother   . Heart attack Mother 29  . Allergic rhinitis Mother   . Diabetes Father   . Stroke Father     onset in 9s  . Cancer Brother     malignant brain tumor  . Colon cancer Neg Hx   . Esophageal cancer Neg Hx   . Rectal cancer Neg Hx   . Stomach cancer Neg Hx   . Angioedema Neg Hx   . Asthma Neg Hx   . Eczema Neg Hx   . Immunodeficiency Neg Hx   . Urticaria Neg Hx     Review of Systems  Constitutional: Negative for chills and fever.  Respiratory: Negative for cough, shortness of breath and wheezing.   Cardiovascular: Negative for chest pain, palpitations and leg swelling.  Gastrointestinal: Negative for abdominal pain and nausea.       No gerd  Musculoskeletal: Positive for arthralgias. Negative for myalgias.  Neurological: Negative for light-headedness and headaches.       Objective:   Vitals:   06/21/16 1321  BP: 132/78  Pulse: 66  Resp: 18   Filed Weights   06/21/16 1321  Weight: 140 lb 1.3 oz (63.5 kg)   Body mass index is 24.81 kg/m.   Physical Exam Constitutional: Appears well-developed and well-nourished. No distress.  HENT:  Head: Normocephalic and atraumatic.  Neck: Neck supple. No tracheal deviation present. No thyromegaly present.  No cervical lymphadenopathy Cardiovascular: Normal rate, regular rhythm and normal heart sounds.   No murmur heard. No carotid bruit .  No edema Pulmonary/Chest: Effort normal and breath sounds normal. No respiratory  distress. No has no wheezes. No rales.  Skin: Skin is warm and dry. Not diaphoretic.  Psychiatric: Normal mood and affect. Behavior is normal.       Assessment & Plan:   See Problem List for Assessment and  Plan of chronic medical problems.   F/u in one year - wellness with nurse, f/u with me

## 2016-06-30 ENCOUNTER — Other Ambulatory Visit (INDEPENDENT_AMBULATORY_CARE_PROVIDER_SITE_OTHER): Payer: Medicare Other

## 2016-06-30 DIAGNOSIS — E039 Hypothyroidism, unspecified: Secondary | ICD-10-CM

## 2016-06-30 DIAGNOSIS — E782 Mixed hyperlipidemia: Secondary | ICD-10-CM | POA: Diagnosis not present

## 2016-06-30 LAB — LIPID PANEL
CHOL/HDL RATIO: 3
Cholesterol: 188 mg/dL (ref 0–200)
HDL: 60.7 mg/dL (ref >39.00)
LDL Cholesterol: 104 mg/dL — ABNORMAL HIGH (ref 0–99)
NONHDL: 127.62
Triglycerides: 118 mg/dL (ref 0.0–149.0)
VLDL: 23.6 mg/dL (ref 0.0–40.0)

## 2016-06-30 LAB — COMPREHENSIVE METABOLIC PANEL
ALT: 11 U/L (ref 0–35)
AST: 20 U/L (ref 0–37)
Albumin: 4.1 g/dL (ref 3.5–5.2)
Alkaline Phosphatase: 58 U/L (ref 39–117)
BILIRUBIN TOTAL: 0.4 mg/dL (ref 0.2–1.2)
BUN: 14 mg/dL (ref 6–23)
CO2: 31 meq/L (ref 19–32)
CREATININE: 0.8 mg/dL (ref 0.40–1.20)
Calcium: 9.1 mg/dL (ref 8.4–10.5)
Chloride: 104 mEq/L (ref 96–112)
GFR: 75.24 mL/min (ref >60.00)
GLUCOSE: 97 mg/dL (ref 70–99)
Potassium: 4 mEq/L (ref 3.5–5.1)
Sodium: 140 mEq/L (ref 135–145)
Total Protein: 6.9 g/dL (ref 6.0–8.3)

## 2016-06-30 LAB — CBC WITH DIFFERENTIAL/PLATELET
BASOS ABS: 0 10*3/uL (ref 0.0–0.1)
Basophils Relative: 0.6 % (ref 0.0–3.0)
EOS ABS: 0.4 10*3/uL (ref 0.0–0.7)
Eosinophils Relative: 4.7 % (ref 0.0–5.0)
HEMATOCRIT: 42.6 % (ref 36.0–46.0)
Hemoglobin: 14.6 g/dL (ref 12.0–15.0)
LYMPHS ABS: 2.3 10*3/uL (ref 0.7–4.0)
LYMPHS PCT: 28.6 % (ref 12.0–46.0)
MCHC: 34.4 g/dL (ref 30.0–36.0)
MCV: 91.5 fl (ref 78.0–100.0)
Monocytes Absolute: 0.6 10*3/uL (ref 0.1–1.0)
Monocytes Relative: 7.5 % (ref 3.0–12.0)
NEUTROS ABS: 4.7 10*3/uL (ref 1.4–7.7)
NEUTROS PCT: 58.6 % (ref 43.0–77.0)
PLATELETS: 353 10*3/uL (ref 150.0–400.0)
RBC: 4.66 Mil/uL (ref 3.87–5.11)
RDW: 13.7 % (ref 11.5–15.5)
WBC: 8 10*3/uL (ref 4.0–10.5)

## 2016-06-30 LAB — TSH: TSH: 2.22 u[IU]/mL (ref 0.35–4.50)

## 2016-07-01 ENCOUNTER — Ambulatory Visit (INDEPENDENT_AMBULATORY_CARE_PROVIDER_SITE_OTHER): Payer: Medicare Other | Admitting: Family

## 2016-07-01 ENCOUNTER — Ambulatory Visit (INDEPENDENT_AMBULATORY_CARE_PROVIDER_SITE_OTHER): Payer: Medicare Other

## 2016-07-01 DIAGNOSIS — M25561 Pain in right knee: Secondary | ICD-10-CM | POA: Diagnosis not present

## 2016-07-01 DIAGNOSIS — G8929 Other chronic pain: Secondary | ICD-10-CM

## 2016-07-01 NOTE — Progress Notes (Signed)
Office Visit Note   Patient: Pamela Rich           Date of Birth: Oct 28, 1945           MRN: [id number] Visit Date: 07/01/2016              Requested by: Binnie Rail, MD [place], [place] [id number] PCP: Binnie Rail, MD   Assessment & Plan: Visit Diagnoses:  1. Chronic pain of right knee     Plan: have provided a steroid injection today. She will use ibuprofen or Aleve for when necessary pain. Have encouraged her to continue with exercise especially swimming for her osteoarthritis. She'll follow up in the office as needed.  Follow-Up Instructions: Return in about 4 weeks (around 07/29/2016), or if symptoms worsen or fail to improve.   Orders:  Orders Placed This Encounter  Procedures  . XR Knee 1-2 Views Right   No orders of the defined types were placed in this encounter.     Procedures: No procedures performed   Clinical Data: No additional findings.   Subjective: Chief Complaint  Patient presents with  . Right Knee - Pain    Patient is a 70 year old woman who is seen today for initial evaluation of right knee pain. States this has been going on for years 4 years had just been having pain with descending stairs. States over the last couple months this has been getting worse increased pain descending stairs as well. Describes mechanical symptoms such as locking and giving way. Moderate relief with ibuprofen.    Review of Systems  Constitutional: Negative for chills and fever.  Musculoskeletal: Positive for arthralgias. Negative for joint swelling.     Objective: Vital Signs: LMP 07/04/2000   Physical Exam  Constitutional: She is oriented to person, place, and time. She appears well-developed and well-nourished.  Pulmonary/Chest: Effort normal.  Musculoskeletal:       Right knee: She exhibits no effusion.  Neurological: She is alert and oriented to person, place, and time.  Psychiatric: She has a normal mood and affect.  Nursing note  reviewed.   Right Knee Exam   Tenderness  The patient is experiencing tenderness in the lateral joint line, medial joint line and patellar tendon.  Range of Motion  The patient has normal right knee ROM.  Tests  Varus: negative Valgus: negative  Other  Swelling: none Other tests: no effusion present      Specialty Comments:  No specialty comments available.  Imaging: No results found.   PMFS History: Patient Active Problem List   Diagnosis Date Noted  . Chronic pain of both knees 06/21/2016  . Mild sinusitis 06/07/2016  . Mild persistent asthma 07/20/2015  . Other allergic rhinitis 07/20/2015  . Mixed hyperlipidemia 03/01/2012  . Migraine without aura 04/05/2010  . FACIAL PARESTHESIA, LEFT 10/16/2009  . Vitamin D deficiency 03/19/2009  . DEGENERATIVE JOINT DISEASE 12/05/2007  . NOCTURIA 12/05/2007  . Hypothyroidism 01/22/2007  . IBS 09/28/2006  . Osteopenia 09/28/2006   Past Medical History:  Diagnosis Date  . Allergy    SEASONAL  . Fibroid    no surgery  . Heart murmur   . Hyperlipidemia   . Hypothyroidism   . Migraines   . Mild intermittent asthma 07/20/2015  . Osteopenia   . Vitamin D deficiency     Family History  Problem Relation Age of Onset  . Hypertension Mother   . Heart disease Mother     CHF;  AS  . Osteoporosis Mother   . Heart attack Mother 73  . Allergic rhinitis Mother   . Diabetes Father   . Stroke Father     onset in 106s  . Cancer Brother     malignant brain tumor  . Colon cancer Neg Hx   . Esophageal cancer Neg Hx   . Rectal cancer Neg Hx   . Stomach cancer Neg Hx   . Angioedema Neg Hx   . Asthma Neg Hx   . Eczema Neg Hx   . Immunodeficiency Neg Hx   . Urticaria Neg Hx     Past Surgical History:  Procedure Laterality Date  . COLONOSCOPY  2014   negative , Dr Ardis Hughs  . cortisone injections     in left hand  . LUMBAR FUSION     Dr Hal Neer  . POLYPECTOMY     uterine  . SEPTOPLASTY    . SINOSCOPY    . TUBAL  LIGATION     Social History   Occupational History  . retired    Social History Main Topics  . Smoking status: Former Smoker    Packs/day: 0.30    Years: 40.00    Types: Cigarettes    Quit date: 07/04/1974  . Smokeless tobacco: Never Used     Comment: smoked 1965-1976 , < 3 cig/ day  . Alcohol use No     Comment: rarely  . Drug use: No  . Sexual activity: No     Comment: BTL

## 2016-07-02 ENCOUNTER — Other Ambulatory Visit: Payer: Self-pay | Admitting: Internal Medicine

## 2016-07-03 ENCOUNTER — Encounter: Payer: Self-pay | Admitting: Internal Medicine

## 2016-07-04 DIAGNOSIS — G5700 Lesion of sciatic nerve, unspecified lower limb: Secondary | ICD-10-CM

## 2016-07-04 HISTORY — DX: Lesion of sciatic nerve, unspecified lower limb: G57.00

## 2016-08-01 ENCOUNTER — Other Ambulatory Visit: Payer: Self-pay | Admitting: Pediatrics

## 2016-08-16 ENCOUNTER — Encounter: Payer: Self-pay | Admitting: Family Medicine

## 2016-08-16 ENCOUNTER — Ambulatory Visit (INDEPENDENT_AMBULATORY_CARE_PROVIDER_SITE_OTHER): Payer: Medicare Other | Admitting: Family Medicine

## 2016-08-16 VITALS — BP 130/78 | HR 75 | Temp 99.1°F | Resp 12 | Ht 63.0 in | Wt 140.5 lb

## 2016-08-16 DIAGNOSIS — H6991 Unspecified Eustachian tube disorder, right ear: Secondary | ICD-10-CM

## 2016-08-16 DIAGNOSIS — H6981 Other specified disorders of Eustachian tube, right ear: Secondary | ICD-10-CM

## 2016-08-16 DIAGNOSIS — R509 Fever, unspecified: Secondary | ICD-10-CM | POA: Diagnosis not present

## 2016-08-16 DIAGNOSIS — J3089 Other allergic rhinitis: Secondary | ICD-10-CM | POA: Diagnosis not present

## 2016-08-16 DIAGNOSIS — J069 Acute upper respiratory infection, unspecified: Secondary | ICD-10-CM | POA: Diagnosis not present

## 2016-08-16 LAB — POCT INFLUENZA A/B
INFLUENZA A, POC: NEGATIVE
Influenza B, POC: NEGATIVE

## 2016-08-16 MED ORDER — PREDNISONE 20 MG PO TABS: 40.0000 mg | ORAL_TABLET | Freq: Every day | ORAL | 0 refills | Status: AC

## 2016-08-16 MED ORDER — BENZONATATE 100 MG PO CAPS: 200.0000 mg | ORAL_CAPSULE | Freq: Two times a day (BID) | ORAL | 0 refills | Status: AC | PRN

## 2016-08-16 NOTE — Progress Notes (Signed)
Pre visit review using our clinic review tool, if applicable. No additional management support is needed unless otherwise documented below in the visit note. 

## 2016-08-16 NOTE — Patient Instructions (Signed)
  [name] I have seen you today for an acute visit.  A few things to remember from today's visit:   Fever, unspecified fever cause - Plan: POCT Influenza A/B  URI, acute - Plan: benzonatate (TESSALON) 100 MG capsule  Other allergic rhinitis - Plan: predniSONE (DELTASONE) 20 MG tablet  Eustachian tube dysfunction, right    viral infections are self-limited and we treat each symptom depending of severity.  Over the counter medications as decongestants and cold medications usually help, they need to be taken with caution if there is a history of high blood pressure or palpitations. Tylenol and/or Ibuprofen also helps with most symptoms (headache, muscle aching, fever,etc) Plenty of fluids. Honey helps with cough. Steam inhalations helps with runny nose, nasal congestion, and may prevent sinus infections. Cough and nasal congestion could last a few days and sometimes weeks. Please follow in not any better in 1-2 weeks or if symptoms get worse.   Prednisone to take with food. Allegra 180 mg may also help.    Medications prescribed today are intended for short period of time and will not be refill upon request, a follow up appointment might be necessary to discuss continuation of of treatment if appropriate.     In general please monitor for signs of worsening symptoms and seek immediate medical attention if any concerning.    Please be sure you have an appointment already scheduled with your PCP before you leave today.

## 2016-08-16 NOTE — Progress Notes (Signed)
HPI:  ACUTE VISIT  Chief Complaint  Patient presents with  . Sinus Problem  . Fever    Pamela Rich is a 71 y.o.female here today complaining of 3-4 days of respiratory symptoms.  Non productive and occasional productive cough. When symptoms first started she had mild nausea (lasted 2 days), no vomiting or abdominal pain. Normal appetite.  Global headache and "sinus pressure" about 3 days ago and improved.  + Nasal congestion, rhinorrhea, sore throat, and post nasal drainage. + Fever, this morning 100 F), and body aches.  Last night she noted right earache.  She has not noted chest pain, dyspnea, or wheezing.  No Hx of recent travel. No sick contact. No known insect bite.  Hx of allergies: Yes, allergic rhinitis and asthma. She is on Flonase and Astelin nasal spray.  OTC medications for this problem: Mucinex yesterday and Tylenol last night.  Symptoms otherwise stable, sinus pain has improved.   Review of Systems  Constitutional: Positive for fatigue and fever. Negative for appetite change and chills.  HENT: Positive for congestion, ear pain, postnasal drip, rhinorrhea, sinus pressure and sore throat. Negative for ear discharge, facial swelling, mouth sores, nosebleeds, sneezing, trouble swallowing and voice change.   Eyes: Negative for discharge, redness and visual disturbance.  Respiratory: Positive for cough. Negative for shortness of breath and wheezing.   Cardiovascular: Negative for chest pain.  Gastrointestinal: Negative for abdominal pain, diarrhea, nausea and vomiting.  Musculoskeletal: Positive for myalgias. Negative for gait problem and neck pain.  Skin: Negative for rash.  Allergic/Immunologic: Positive for environmental allergies.  Neurological: Positive for headaches. Negative for syncope, speech difficulty and weakness.  Hematological: Negative for adenopathy. Does not bruise/bleed easily.  Psychiatric/Behavioral: Negative for  confusion. The patient is nervous/anxious.       Current Outpatient Prescriptions on File Prior to Visit  Medication Sig Dispense Refill  . albuterol (PROAIR HFA) 108 (90 Base) MCG/ACT inhaler Inhale 2 puffs into the lungs every 4 (four) hours as needed for wheezing. Or coughing spells. (Patient taking differently: Inhale 2 puffs into the lungs every 4 (four) hours as needed for wheezing (using 1 puff daily prn). Or coughing spells.) 1 Inhaler 1  . Ascorbic Acid (VITAMIN C) 1000 MG tablet Take 1,000 mg by mouth daily.      Marland Kitchen aspirin 81 MG tablet Take 81 mg by mouth daily.    Marland Kitchen azelastine (ASTELIN) 0.1 % nasal spray USE 1 SPRAY IN EACH NOSTRIL TWICE A DAY AS DIRECTED 30 mL 3  . butalbital-aspirin-caffeine (FIORINAL) 50-325-40 MG per capsule TAKE ONE CAPSULE BY MOUTH EVERY 6 HOURS AS NEEDED FOR HEADACHE 30 capsule 0  . Cholecalciferol (VITAMIN D3) 1000 UNITS CAPS Take by mouth daily.      Marland Kitchen estradiol (VIVELLE-DOT) 0.05 MG/24HR patch Place 1 patch (0.05 mg total) onto the skin 2 (two) times a week. 24 patch 4  . fluticasone (FLONASE) 50 MCG/ACT nasal spray USE 2 SPRAYS IN EACH NOSTRIL EVERY DAY FOR STUFFY NOSE OR DRAINAGE 16 g 5  . levothyroxine (SYNTHROID, LEVOTHROID) 112 MCG tablet TAKE 1 TABLET BY MOUTH EVERY DAY BEFORE BREAKFAST 90 tablet 1  . Multiple Vitamin (MULTIVITAMIN PO) Take by mouth daily.      . pravastatin (PRAVACHOL) 20 MG tablet TAKE 1 TABLET BY MOUTH EVERY DAY 90 tablet 3  . progesterone (PROMETRIUM) 100 MG capsule Take 1 capsule (100 mg total) by mouth daily. 90 capsule 4   No current facility-administered medications on file  prior to visit.      Past Medical History:  Diagnosis Date  . Allergy    SEASONAL  . Fibroid    no surgery  . Heart murmur   . Hyperlipidemia   . Hypothyroidism   . Migraines   . Mild intermittent asthma 07/20/2015  . Osteopenia   . Vitamin D deficiency    Allergies  Allergen Reactions  . Pneumococcal Vaccines     Area weakness, redness,  and swelling   . Gabapentin     constipation    Social History   Social History  . Marital status: Widowed    Spouse name: N/A  . Number of children: 3  . Years of education: N/A   Occupational History  . retired    Social History Main Topics  . Smoking status: Former Smoker    Packs/day: 0.30    Years: 40.00    Types: Cigarettes    Quit date: 07/04/1974  . Smokeless tobacco: Never Used     Comment: smoked 1965-1976 , < 3 cig/ day  . Alcohol use No     Comment: rarely  . Drug use: No  . Sexual activity: No     Comment: BTL   Other Topics Concern  . None   Social History Narrative   Exercise: walking, stretching, weights    Vitals:   08/16/16 1132  BP: 130/78  Pulse: 75  Resp: 12  Temp: 99.1 F (37.3 C)  O2 sat 98% at RA.  Body mass index is 24.89 kg/m.   Physical Exam  Nursing note and vitals reviewed. Constitutional: She is oriented to person, place, and time. She appears well-developed and well-nourished. She does not appear ill. No distress.  HENT:  Head: Atraumatic.  Right Ear: External ear and ear canal normal. Tympanic membrane is not erythematous. A middle ear effusion is present.  Left Ear: Tympanic membrane, external ear and ear canal normal.  Nose: Rhinorrhea and septal deviation present. Right sinus exhibits no maxillary sinus tenderness and no frontal sinus tenderness. Left sinus exhibits no maxillary sinus tenderness and no frontal sinus tenderness.  Mouth/Throat: Uvula is midline and mucous membranes are normal. Posterior oropharyngeal erythema (mild) present. No oropharyngeal exudate or posterior oropharyngeal edema.  Post nasal drainage.  Eyes: Conjunctivae and EOM are normal.  Neck: No muscular tenderness present.  Cardiovascular: Normal rate and regular rhythm.   No murmur heard. Respiratory: Effort normal and breath sounds normal. No stridor. No respiratory distress.  Lymphadenopathy:       Head (right side): No submandibular  adenopathy present.       Head (left side): No submandibular adenopathy present.    She has no cervical adenopathy.  Neurological: She is alert and oriented to person, place, and time. She has normal strength. Coordination and gait normal.  Skin: Skin is warm. No rash noted. No erythema.  Psychiatric: Her speech is normal. Her mood appears anxious.  Well groomed, good eye contact.      ASSESSMENT AND PLAN:   Necha was seen today for sinus problem and fever.  Diagnoses and all orders for this visit:  Fever, unspecified fever cause -     POCT Influenza A/B  URI, acute  Symptoms suggests a viral etiology, I explained patient that symptomatic treatment is usually recommended in this case, so I really  do not think abx is needed at this time. Instructed to monitor for signs of complications, clearly instructed about warning signs. I also explained that cough and  nasal congestion can last a few days and sometimes weeks. F/U as needed.   -     benzonatate (TESSALON) 100 MG capsule; Take 2 capsules (200 mg total) by mouth 2 (two) times daily as needed for cough.  Other allergic rhinitis  Today no sinus tenderness or other findings that suggest acute bacterial sinusitis. She tells me that Prednisone has helped in the past for "sinus pressure", some side effects discussed. Allegra 180 mg daily may help. No changes in Flonase or Astelin.   -     predniSONE (DELTASONE) 20 MG tablet; Take 2 tablets (40 mg total) by mouth daily with breakfast.  Eustachian tube dysfunction, right  TM is not erythematous. OTC decongestants might help, we discussed some side effects. Auto inflation maneuver a few times per day may also help. Instructed about warning signs.      -Ms. ANJANNETTE WEYER was advised to return or notify a doctor immediately if symptoms worsen or persist or new concerns arise.       Betty G. Martinique, MD  [facility]. [place]  office.

## 2016-08-19 ENCOUNTER — Other Ambulatory Visit: Payer: Self-pay | Admitting: Family Medicine

## 2016-08-19 ENCOUNTER — Telehealth: Payer: Self-pay | Admitting: Internal Medicine

## 2016-08-19 MED ORDER — DOXYCYCLINE HYCLATE 100 MG PO TABS: 100.0000 mg | ORAL_TABLET | Freq: Two times a day (BID) | ORAL | 0 refills | Status: AC

## 2016-08-19 NOTE — Telephone Encounter (Signed)
Called and spoke with patient. Patient is aware & verbalized understanding.

## 2016-08-19 NOTE — Telephone Encounter (Signed)
Pt state that she is running a fever of 100+ for over 4 days and normally it is like that due to fever and she was in on Tuesday to see Dr. Martinique and would like to see if she can call in a antibiotic.  Pt refused to make a appointment. Pharm:  [facility].  Pt would like to have a call when this is called in.

## 2016-08-19 NOTE — Telephone Encounter (Signed)
Rx sent: Doxycycline 100 mg to start taking bid with food for 7 days. Tylenol 500 mg tid for fever. Plenty of po fluids. If viral illness abx will not help.  Please instruct pt to follow if symptoms persist or she feels worse.  Thanks, BJ

## 2016-08-29 ENCOUNTER — Telehealth: Payer: Self-pay | Admitting: *Deleted

## 2016-08-29 MED ORDER — FLUTICASONE PROPIONATE HFA 44 MCG/ACT IN AERO: 2.0000 | INHALATION_SPRAY | Freq: Two times a day (BID) | RESPIRATORY_TRACT | 5 refills | Status: DC

## 2016-08-29 NOTE — Telephone Encounter (Signed)
Patient aware and medication sent in. 

## 2016-08-29 NOTE — Telephone Encounter (Signed)
Patient called and stated that the Qvar is causing her jittery and would like to know if something else can be called in.

## 2016-08-29 NOTE — Telephone Encounter (Signed)
"  jittery" is an unusual side effect for Qvar. However, we can call in [place]. Thanks.

## 2016-09-19 ENCOUNTER — Telehealth: Payer: Self-pay | Admitting: Internal Medicine

## 2016-09-19 DIAGNOSIS — I739 Peripheral vascular disease, unspecified: Secondary | ICD-10-CM

## 2016-09-19 NOTE — Telephone Encounter (Signed)
Preliminary testing shows possible peripheral artery disease.  We should have her see vascular surgery for further testing if she is interested.

## 2016-09-19 NOTE — Telephone Encounter (Signed)
Monica from [employer] called in, she is PA and they do an in home check up on pt PAD .  It showed right foot 0.93 mild and left foot 0.20 Sever   If you have any question called her at [phone]

## 2016-09-19 NOTE — Telephone Encounter (Signed)
Please advise, I dont understand.

## 2016-09-20 NOTE — Telephone Encounter (Signed)
Spoke with pt, she states that she had a back surgery and since then the nerves in her legs have not been right. She would like to know if this could be the reason for the outcome of the check up. She is okay with seeing Vascular if you do not think the nerve damage is the issue.

## 2016-09-20 NOTE — Telephone Encounter (Signed)
The nerve damage is not related.  I have put the referral in

## 2016-09-20 NOTE — Telephone Encounter (Signed)
Informed pt during phone call this morning that we would put the referral in if the nerve damage was not related. She was okay with that.

## 2016-09-22 ENCOUNTER — Other Ambulatory Visit: Payer: Self-pay | Admitting: Obstetrics & Gynecology

## 2016-09-23 NOTE — Telephone Encounter (Signed)
Medication refill request: vivelle - dot  Last AEX:  07/28/15 SM Next AEX: 11/11/16 SM Last MMG (if hormonal medication request): 01/26/16 BIRADS1:neg  Refill authorized: 07/28/15 #24/4R. Today #24/0R?

## 2016-10-05 ENCOUNTER — Other Ambulatory Visit: Payer: Self-pay | Admitting: Obstetrics & Gynecology

## 2016-10-05 NOTE — Telephone Encounter (Signed)
Medication refill request: Progesterone Last AEX:  07/28/15 SM Next AEX: 11/11/16 SM Last MMG (if hormonal medication request): 01/26/16 BIRADS1, Density C, TBC Refill authorized: 07/28/15 #90 4R. Please advise. Thank you.   Routing to Anton Ruiz since SM is out of the office.

## 2016-10-05 NOTE — Telephone Encounter (Signed)
Prometrium refill sent, #90, no refills (annual next month with Dr Sabra Heck)

## 2016-10-10 ENCOUNTER — Encounter: Payer: Self-pay | Admitting: Allergy and Immunology

## 2016-10-10 ENCOUNTER — Ambulatory Visit (INDEPENDENT_AMBULATORY_CARE_PROVIDER_SITE_OTHER): Payer: Medicare Other | Admitting: Allergy and Immunology

## 2016-10-10 VITALS — BP 118/72 | HR 80 | Resp 14 | Ht 62.0 in | Wt 142.0 lb

## 2016-10-10 DIAGNOSIS — J3089 Other allergic rhinitis: Secondary | ICD-10-CM | POA: Diagnosis not present

## 2016-10-10 DIAGNOSIS — J4530 Mild persistent asthma, uncomplicated: Secondary | ICD-10-CM | POA: Diagnosis not present

## 2016-10-10 NOTE — Patient Instructions (Signed)
Mild persistent asthma Well-controlled.  For now, continue Flovent 44 g, 2 inhalations via spacer device twice a day, and albuterol HFA, 1-2 inhalations every 4-6 hours as needed.  Subjective and objective measures of pulmonary function will be followed and the treatment plan will be adjusted accordingly.  Other allergic rhinitis Stable.  Continue appropriate allergen avoidance measures, azelastine nasal spray, and fluticasone nasal spray as needed.  Nasal saline lavage (NeilMed) has been recommended as needed and prior to medicated nasal sprays along with instructions for proper administration.   Return in about 4 months (around 02/09/2017), or if symptoms worsen or fail to improve.

## 2016-10-10 NOTE — Assessment & Plan Note (Signed)
Well-controlled.  For now, continue Flovent 44 g, 2 inhalations via spacer device twice a day, and albuterol HFA, 1-2 inhalations every 4-6 hours as needed.  Subjective and objective measures of pulmonary function will be followed and the treatment plan will be adjusted accordingly.

## 2016-10-10 NOTE — Progress Notes (Signed)
Follow-up Note  RE: Pamela Rich MRN: [id number] DOB: [birth year] Date of Office Visit: 10/10/2016  Primary care provider: Binnie Rail, MD Referring provider: Binnie Rail, MD  History of present illness: Pamela Rich is a 71 y.o. female with asthma and allergic rhinitis presenting today for follow up.  She was last seen in this clinic on 06/07/2016.  She reports that overall her upper and lower respiratory symptoms have been well-controlled with the exception of a respiratory tract infection that she developed over Easter while visiting her grandchildren.  Currently her symptoms are well-controlled she has no complaints.  Due to insurance coverage, she was switched from Qvar to Flovent.   Assessment and plan: Mild persistent asthma Well-controlled.  For now, continue Flovent 44 g, 2 inhalations via spacer device twice a day, and albuterol HFA, 1-2 inhalations every 4-6 hours as needed.  Subjective and objective measures of pulmonary function will be followed and the treatment plan will be adjusted accordingly.  Other allergic rhinitis Stable.  Continue appropriate allergen avoidance measures, azelastine nasal spray, and fluticasone nasal spray as needed.  Nasal saline lavage (NeilMed) has been recommended as needed and prior to medicated nasal sprays along with instructions for proper administration.   Diagnostics: Spirometry reveals an FVC of 2.11 L and an FEV1 of 1.72 L (82% predicted).  Mild restrictive pattern.  She has demonstrated full reversibility in the past.  Please see scanned spirometry results for details.      Physical examination: Blood pressure 118/72, pulse 80, resp. rate 14, height 5\' 2"  (1.575 m), weight 142 lb (64.4 kg), last menstrual period 07/04/2000, SpO2 97 %.  General: Alert, interactive, in no acute distress. HEENT: TMs pearly gray, turbinates mildly edematous without discharge, post-pharynx mildly erythematous. Neck: Supple without  lymphadenopathy. Lungs: Clear to auscultation without wheezing, rhonchi or rales. CV: Normal S1, S2 without murmurs. Skin: Warm and dry, without lesions or rashes.  The following portions of the patient's history were reviewed and updated as appropriate: allergies, current medications, past family history, past medical history, past social history, past surgical history and problem list.  Allergies as of 10/10/2016      Reactions   Pneumococcal Vaccines    Area weakness, redness, and swelling    Gabapentin    constipation      Medication List       Accurate as of 10/10/16  1:17 PM. Always use your most recent med list.          albuterol 108 (90 Base) MCG/ACT inhaler Commonly known as:  PROAIR HFA Inhale 2 puffs into the lungs every 4 (four) hours as needed for wheezing. Or coughing spells.   aspirin 81 MG tablet Take 81 mg by mouth daily.   azelastine 0.1 % nasal spray Commonly known as:  ASTELIN USE 1 SPRAY IN EACH NOSTRIL TWICE A DAY AS DIRECTED   butalbital-aspirin-caffeine 50-325-40 MG capsule Commonly known as:  FIORINAL TAKE ONE CAPSULE BY MOUTH EVERY 6 HOURS AS NEEDED FOR HEADACHE   estradiol 0.05 MG/24HR patch Commonly known as:  VIVELLE-DOT PLACE 1 PATCH (0.05 MG TOTAL) ONTO THE SKIN 2 (TWO) TIMES A WEEK.   fluticasone 44 MCG/ACT inhaler Commonly known as:  FLOVENT HFA Inhale 2 puffs into the lungs 2 (two) times daily.   fluticasone 50 MCG/ACT nasal spray Commonly known as:  FLONASE USE 2 SPRAYS IN EACH NOSTRIL EVERY DAY FOR STUFFY NOSE OR DRAINAGE   levothyroxine 112 MCG tablet Commonly known as:  SYNTHROID,  LEVOTHROID TAKE 1 TABLET BY MOUTH EVERY DAY BEFORE BREAKFAST   MULTIVITAMIN PO Take by mouth daily.   pravastatin 20 MG tablet Commonly known as:  PRAVACHOL TAKE 1 TABLET BY MOUTH EVERY DAY   progesterone 100 MG capsule Commonly known as:  PROMETRIUM TAKE ONE CAPSULE BY MOUTH EVERY DAY   vitamin C 1000 MG tablet Take 1,000 mg by mouth  daily.   Vitamin D3 1000 units Caps Take by mouth daily.       Allergies  Allergen Reactions  . Pneumococcal Vaccines     Area weakness, redness, and swelling   . Gabapentin     constipation    I appreciate the opportunity to take part in [place] care. Please do not hesitate to contact me with questions.  Sincerely,   R. Edgar Frisk, MD

## 2016-10-10 NOTE — Assessment & Plan Note (Signed)
Stable.  Continue appropriate allergen avoidance measures, azelastine nasal spray, and fluticasone nasal spray as needed.  Nasal saline lavage (NeilMed) has been recommended as needed and prior to medicated nasal sprays along with instructions for proper administration.

## 2016-10-14 ENCOUNTER — Other Ambulatory Visit: Payer: Self-pay

## 2016-10-14 DIAGNOSIS — R6889 Other general symptoms and signs: Secondary | ICD-10-CM

## 2016-10-17 ENCOUNTER — Encounter (HOSPITAL_COMMUNITY): Payer: Medicare Other

## 2016-10-17 ENCOUNTER — Ambulatory Visit (HOSPITAL_COMMUNITY)
Admission: RE | Admit: 2016-10-17 | Discharge: 2016-10-17 | Disposition: A | Discharge status: Home / Self Care | Payer: Medicare Other | Source: Ambulatory Visit | Attending: Vascular Surgery | Admitting: Vascular Surgery

## 2016-10-17 ENCOUNTER — Encounter: Payer: Self-pay | Admitting: Vascular Surgery

## 2016-10-17 ENCOUNTER — Ambulatory Visit (INDEPENDENT_AMBULATORY_CARE_PROVIDER_SITE_OTHER)
Admission: RE | Admit: 2016-10-17 | Discharge: 2016-10-17 | Disposition: A | Discharge status: Home / Self Care | Payer: Medicare Other | Source: Ambulatory Visit | Attending: Surgery | Admitting: Surgery

## 2016-10-17 ENCOUNTER — Other Ambulatory Visit: Payer: Self-pay

## 2016-10-17 DIAGNOSIS — R6889 Other general symptoms and signs: Secondary | ICD-10-CM

## 2016-10-17 DIAGNOSIS — R9439 Abnormal result of other cardiovascular function study: Secondary | ICD-10-CM | POA: Insufficient documentation

## 2016-10-17 DIAGNOSIS — I739 Peripheral vascular disease, unspecified: Secondary | ICD-10-CM

## 2016-10-17 LAB — VAS US LOWER EXTREMITY ARTERIAL DUPLEX
RIGHT ANT DIST TIBAL SYS PSV: 46 cm/s
RTIBDISTSYS: 70 cm/s
Right peroneal sys PSV: 55 cm/s
Right super femoral dist sys PSV: -63 cm/s
Right super femoral mid sys PSV: -77 cm/s
Right super femoral prox sys PSV: 84 cm/s

## 2016-10-18 ENCOUNTER — Telehealth: Payer: Self-pay

## 2016-10-18 NOTE — Telephone Encounter (Signed)
Would she consider just stopping them and seeing what kind of symptoms she has.  She has appt 11/11/16 and we could see how she is doing then (or she can call sooner if has symptoms).  I would really like her to try and stop the hormonal therapy if possible.

## 2016-10-18 NOTE — Telephone Encounter (Signed)
Spoke with patient. Advised of message as seen below regarding coverage for Estradiol patches. Patient states she has spoken with the pharmacy about OOP cost and is not able to afford this. Would like to start on an alternative medication at this time to reduce cost. Is open to taking oral estrogen to reduce cost if this is an option. Advised will speak with Dr.Miller and return call with further recommendations. Patient is agreeable.

## 2016-10-18 NOTE — Telephone Encounter (Signed)
Left message to call [place] at [phone].  Need to advised Dr.Miller received notification from [facility] that her Estradiol patches will not be covered under her plan. Patient will need to pay OOP cost for this rx.

## 2016-10-19 ENCOUNTER — Encounter (HOSPITAL_COMMUNITY): Payer: Medicare Other

## 2016-10-19 ENCOUNTER — Encounter: Payer: Medicare Other | Admitting: Vascular Surgery

## 2016-10-19 NOTE — Telephone Encounter (Signed)
Patient is returning a call to [name]. °

## 2016-10-19 NOTE — Telephone Encounter (Signed)
Left message to call [name] at [phone]. 

## 2016-10-19 NOTE — Telephone Encounter (Signed)
Spoke with patient. Advised of message as seen below from [place]. Patient is agreeable to coming off HRT to see how she does. Will finish out current rx and then stop. Will call if she has any symptoms.  Routing to provider for final review. Patient agreeable to disposition. Will close encounter.

## 2016-10-20 ENCOUNTER — Telehealth: Payer: Self-pay | Admitting: Obstetrics & Gynecology

## 2016-10-20 NOTE — Telephone Encounter (Signed)
Call to [facility] pharmacy. Advised PA for Estradiol patch has been denied and that patient will be coming off this medication (see telephone note dated 10/18/2016). Pharmacy verbalizes understanding.  Routing to provider for final review. Patient agreeable to disposition. Will close encounter.

## 2016-10-20 NOTE — Telephone Encounter (Signed)
Kelly from [facility] called to check on the status of the prior-authorization for the patient's Estadiol patch.

## 2016-10-21 ENCOUNTER — Encounter: Payer: Self-pay | Admitting: Vascular Surgery

## 2016-10-28 ENCOUNTER — Encounter: Payer: Self-pay | Admitting: Vascular Surgery

## 2016-10-28 ENCOUNTER — Encounter: Payer: Medicare Other | Admitting: Vascular Surgery

## 2016-10-28 ENCOUNTER — Ambulatory Visit (INDEPENDENT_AMBULATORY_CARE_PROVIDER_SITE_OTHER): Payer: Medicare Other | Admitting: Vascular Surgery

## 2016-10-28 VITALS — BP 142/81 | HR 66 | Temp 97.1°F | Resp 14 | Ht 62.0 in | Wt 140.0 lb

## 2016-10-28 DIAGNOSIS — M25562 Pain in left knee: Secondary | ICD-10-CM

## 2016-10-28 NOTE — Progress Notes (Signed)
Vitals:   10/28/16 1228  BP: (!) 147/80  Pulse: 65  Resp: 14  Temp: 97.1 F (36.2 C)  SpO2: 98%  Weight: 140 lb (63.5 kg)  Height: 5\' 2"  (1.575 m)

## 2016-10-28 NOTE — Progress Notes (Signed)
Patient ID: Pamela Rich, female   DOB: 1946-02-11, 71 y.o.   MRN: [id number]  Reason for Consult: New Evaluation (bilateral leg pain  left leg worse)   Referred by Binnie Rail, MD  Subjective:     HPI:  Pamela Rich is a 71 y.o. female with history of back surgery that has left her with some pain in her left lower extremity. She had a nurse come to her house perform arterial checks and demonstrate 20% blood flow in her left leg. The day she was able to walk 2 miles without stopping. She does not have tissue loss or ulceration or rest pain on either leg. She is able to walk without any limitation. She is worried over the 20% blood flow but is otherwise without issues on today's exam.  Past Medical History:  Diagnosis Date  . Allergy    SEASONAL  . Fibroid    no surgery  . Heart murmur   . Hyperlipidemia   . Hypothyroidism   . Migraines   . Mild intermittent asthma 07/20/2015  . Osteopenia   . Vitamin D deficiency    Family History  Problem Relation Age of Onset  . Hypertension Mother   . Heart disease Mother     CHF; AS  . Osteoporosis Mother   . Heart attack Mother 18  . Allergic rhinitis Mother   . Diabetes Father   . Stroke Father     onset in 46s  . Cancer Brother     malignant brain tumor  . Colon cancer Neg Hx   . Esophageal cancer Neg Hx   . Rectal cancer Neg Hx   . Stomach cancer Neg Hx   . Angioedema Neg Hx   . Asthma Neg Hx   . Eczema Neg Hx   . Immunodeficiency Neg Hx   . Urticaria Neg Hx    Past Surgical History:  Procedure Laterality Date  . COLONOSCOPY  2014   negative , Dr Ardis Hughs  . cortisone injections     in left hand  . LUMBAR FUSION     Dr Hal Neer  . POLYPECTOMY     uterine  . SEPTOPLASTY    . SINOSCOPY    . TUBAL LIGATION      Short Social History:  Social History  Substance Use Topics  . Smoking status: Former Smoker    Packs/day: 0.30    Years: 40.00    Types: Cigarettes    Quit date: 07/04/1974  . Smokeless  tobacco: Never Used     Comment: smoked 1965-1976 , < 3 cig/ day  . Alcohol use No     Comment: rarely    Allergies  Allergen Reactions  . Pneumococcal Vaccines     Area weakness, redness, and swelling   . Gabapentin     constipation    Current Outpatient Prescriptions  Medication Sig Dispense Refill  . albuterol (PROAIR HFA) 108 (90 Base) MCG/ACT inhaler Inhale 2 puffs into the lungs every 4 (four) hours as needed for wheezing. Or coughing spells. (Patient taking differently: Inhale 2 puffs into the lungs every 4 (four) hours as needed for wheezing (using 1 puff daily prn). Or coughing spells.) 1 Inhaler 1  . Ascorbic Acid (VITAMIN C) 1000 MG tablet Take 1,000 mg by mouth daily.      Marland Kitchen aspirin 81 MG tablet Take 81 mg by mouth daily.    Marland Kitchen azelastine (ASTELIN) 0.1 % nasal spray USE 1 SPRAY IN EACH NOSTRIL  TWICE A DAY AS DIRECTED 30 mL 3  . butalbital-aspirin-caffeine (FIORINAL) 50-325-40 MG per capsule TAKE ONE CAPSULE BY MOUTH EVERY 6 HOURS AS NEEDED FOR HEADACHE 30 capsule 0  . Cholecalciferol (VITAMIN D3) 1000 UNITS CAPS Take by mouth daily.      Marland Kitchen estradiol (VIVELLE-DOT) 0.05 MG/24HR patch PLACE 1 PATCH (0.05 MG TOTAL) ONTO THE SKIN 2 (TWO) TIMES A WEEK. 24 patch 0  . fluticasone (FLONASE) 50 MCG/ACT nasal spray USE 2 SPRAYS IN EACH NOSTRIL EVERY DAY FOR STUFFY NOSE OR DRAINAGE 16 g 5  . fluticasone (FLOVENT HFA) 44 MCG/ACT inhaler Inhale 2 puffs into the lungs 2 (two) times daily. 1 Inhaler 5  . levothyroxine (SYNTHROID, LEVOTHROID) 112 MCG tablet TAKE 1 TABLET BY MOUTH EVERY DAY BEFORE BREAKFAST 90 tablet 1  . Multiple Vitamin (MULTIVITAMIN PO) Take by mouth daily.      . pravastatin (PRAVACHOL) 20 MG tablet TAKE 1 TABLET BY MOUTH EVERY DAY 90 tablet 3  . progesterone (PROMETRIUM) 100 MG capsule TAKE ONE CAPSULE BY MOUTH EVERY DAY 90 capsule 0   No current facility-administered medications for this visit.     Review of Systems  Constitutional:  Constitutional negative. HENT:  HENT negative.  Eyes: Eyes negative.  Respiratory: Respiratory negative.  Cardiovascular: Cardiovascular negative.  GI: Gastrointestinal negative.  Musculoskeletal: Musculoskeletal negative.  Skin: Skin negative.  Neurological: Positive for numbness.  Hematologic: Hematologic/lymphatic negative.  Psychiatric: Psychiatric negative.        Objective:  Objective   Vitals:   10/28/16 1228 10/28/16 1230  BP: (!) 147/80 (!) 142/81  Pulse: 65 66  Resp: 14   Temp: 97.1 F (36.2 C)   SpO2: 98%   Weight: 140 lb (63.5 kg)   Height: 5\' 2"  (1.575 m)    Body mass index is 25.61 kg/m.  Physical Exam  Constitutional: She is oriented to person, place, and time. She appears well-developed.  HENT:  Head: Normocephalic.  Eyes: Pupils are equal, round, and reactive to light.  Neck: Normal range of motion.  Cardiovascular: Normal rate.   Pulses:      Carotid pulses are 2+ on the right side, and 2+ on the left side.      Radial pulses are 2+ on the right side, and 2+ on the left side.       Popliteal pulses are 2+ on the right side, and 2+ on the left side.       Dorsalis pedis pulses are 2+ on the right side, and 2+ on the left side.       Posterior tibial pulses are 2+ on the right side, and 2+ on the left side.  Pulmonary/Chest: Effort normal.  Abdominal: Soft. She exhibits no mass.  Musculoskeletal: Normal range of motion. She exhibits edema.  Neurological: She is alert and oriented to person, place, and time.  Skin: Skin is warm and dry.  Psychiatric: She has a normal mood and affect. Her behavior is normal. Judgment and thought content normal.    Data: ABI right 1.16 left 1.17.     Assessment/Plan:     71 year old female with some rigid residual neurologic deficit from back surgery many years ago in the left leg. Was presented told she had 20% blood flow to her left leg that is not demonstrated on our ABIs which are greater than 1 bilaterally and she does have palpable pulses  and is able to walk without other signs of vascular disease. From our standpoint she can f/u prn.  Leveon Pelzer Christopher Christinna Sprung MD [facility]  

## 2016-11-09 ENCOUNTER — Encounter (HOSPITAL_COMMUNITY): Payer: Medicare Other

## 2016-11-09 ENCOUNTER — Encounter: Payer: Medicare Other | Admitting: Vascular Surgery

## 2016-11-10 ENCOUNTER — Ambulatory Visit (INDEPENDENT_AMBULATORY_CARE_PROVIDER_SITE_OTHER): Payer: Medicare Other

## 2016-11-10 ENCOUNTER — Encounter (INDEPENDENT_AMBULATORY_CARE_PROVIDER_SITE_OTHER): Payer: Self-pay | Admitting: Family

## 2016-11-10 ENCOUNTER — Ambulatory Visit (INDEPENDENT_AMBULATORY_CARE_PROVIDER_SITE_OTHER): Payer: Medicare Other | Admitting: Orthopedic Surgery

## 2016-11-10 VITALS — Ht 64.0 in | Wt 140.0 lb

## 2016-11-10 DIAGNOSIS — M79605 Pain in left leg: Secondary | ICD-10-CM

## 2016-11-10 MED ORDER — PREDNISONE 10 MG PO TABS: 10.0000 mg | ORAL_TABLET | Freq: Every day | ORAL | 0 refills | Status: DC

## 2016-11-10 NOTE — Progress Notes (Signed)
Office Visit Note   Patient: Pamela Rich           Date of Birth: 01-15-1946           MRN: [id number] Visit Date: 11/10/2016              Requested by: Binnie Rail, MD [place], [place] [id number] PCP: Binnie Rail, MD  Chief Complaint  Patient presents with  . Left Leg - Follow-up      HPI: Patient is a 71 year old woman who states that she had back surgery with Dr. Hal Neer about 30 years ago for left-sided radicular pain she states she cannot stand at the time. She states that recently in March she slipped in her driveway flew up landed and has been having burning pain in the lateral aspect the left leg since that time she's also had some groin pain. She states it's worse with prolonged activities.  Assessment & Plan: Visit Diagnoses:  1. Pain in left leg     Plan: Patient is given a prescription for low-dose prednisone if this does not resolve her symptoms she will follow up and we will evaluate for possible MRI scan of the lumbar spine.  Follow-Up Instructions: Return if symptoms worsen or fail to improve.   Ortho Exam  Patient is alert, oriented, no adenopathy, well-dressed, normal affect, normal respiratory effort. Examination patient has a negative straight leg raise bilaterally she has no pain with range of motion the hip knee or ankle. She has tenderness to palpation of the anterior compartment as well as the posterior compartment as well as the medial border of the tibia and the lateral border of the tibia the radiograph shows no evidence of a fracture. As good motor strength in all motor groups of both lower extremities.  Imaging: Xr Tibia/fibula Left  Result Date: 11/10/2016 Radiographs of the left tibia and fibula are negative.    Labs: Lab Results  Component Value Date   LABORGA ESCHERICHIA COLI 03/08/2013    Orders:  Orders Placed This Encounter  Procedures  . XR Tibia/Fibula Left   Meds ordered this encounter  Medications  .  predniSONE (DELTASONE) 10 MG tablet    Sig: Take 1 tablet (10 mg total) by mouth daily with breakfast.    Dispense:  30 tablet    Refill:  0     Procedures: No procedures performed  Clinical Data: No additional findings.  ROS:  All other systems negative, except as noted in the HPI. Review of Systems  Objective: Vital Signs: Ht 5\' 4"  (1.626 m)   Wt 140 lb (63.5 kg)   LMP 07/04/2000   BMI 24.03 kg/m   Specialty Comments:  No specialty comments available.  PMFS History: Patient Active Problem List   Diagnosis Date Noted  . Chronic pain of both knees 06/21/2016  . Mild sinusitis 06/07/2016  . Mild persistent asthma 07/20/2015  . Other allergic rhinitis 07/20/2015  . Mixed hyperlipidemia 03/01/2012  . Migraine without aura 04/05/2010  . FACIAL PARESTHESIA, LEFT 10/16/2009  . Vitamin D deficiency 03/19/2009  . DEGENERATIVE JOINT DISEASE 12/05/2007  . NOCTURIA 12/05/2007  . Hypothyroidism 01/22/2007  . IBS 09/28/2006  . Osteopenia 09/28/2006   Past Medical History:  Diagnosis Date  . Allergy    SEASONAL  . Fibroid    no surgery  . Heart murmur   . Hyperlipidemia   . Hypothyroidism   . Migraines   . Mild intermittent asthma 07/20/2015  .  Osteopenia   . Vitamin D deficiency     Family History  Problem Relation Age of Onset  . Hypertension Mother   . Heart disease Mother        CHF; AS  . Osteoporosis Mother   . Heart attack Mother 76  . Allergic rhinitis Mother   . Diabetes Father   . Stroke Father        onset in 47s  . Cancer Brother        malignant brain tumor  . Colon cancer Neg Hx   . Esophageal cancer Neg Hx   . Rectal cancer Neg Hx   . Stomach cancer Neg Hx   . Angioedema Neg Hx   . Asthma Neg Hx   . Eczema Neg Hx   . Immunodeficiency Neg Hx   . Urticaria Neg Hx     Past Surgical History:  Procedure Laterality Date  . COLONOSCOPY  2014   negative , Dr Ardis Hughs  . cortisone injections     in left hand  . LUMBAR FUSION     Dr Hal Neer    . POLYPECTOMY     uterine  . SEPTOPLASTY    . SINOSCOPY    . TUBAL LIGATION     Social History   Occupational History  . retired    Social History Main Topics  . Smoking status: Former Smoker    Packs/day: 0.30    Years: 40.00    Types: Cigarettes    Quit date: 07/04/1974  . Smokeless tobacco: Never Used     Comment: smoked 1965-1976 , < 3 cig/ day  . Alcohol use No     Comment: rarely  . Drug use: No  . Sexual activity: No     Comment: BTL

## 2016-11-10 NOTE — Progress Notes (Signed)
71 y.o. G4P3 WidowedCaucasianF here for annual exam.  Reports she is doing well except for having some left leg issues.  Saw vascular surgeon due to nurse home evaluation that showed circulation decrease in left leg.  Doppler was normal per pt.  Saw ortho yesterday.  Xray was negative.  She is on a steroid taper right now.  She has tried again this year to taper off the patch.  She reports she just could not sleep while she was off it.  She knows that there are risks but states she cannot stand to feel the way she's felt off of the estrogen.    Denies vaginal bleeding.  Patient's last menstrual period was 07/04/2000.          Sexually active: No.  The current method of family planning is post menopausal status.    Exercising: Yes.    walking Smoker:  no  Health Maintenance: Pap:  07/28/15 Neg  04/02/13 Neg  History of abnormal Pap:  no MMG:  01/26/16 BIRADS1:neg  Colonoscopy:  12/25/12 Normal. f/u 10 years  BMD:   11/05/15 Osteopenia  TDaP:  07/2012  Pneumonia vaccine(s):  2016. Allergic reaction  Zostavax:   2009 Hep C testing: 06/04/15 Neg  Screening Labs: PCP   reports that she quit smoking about 42 years ago. Her smoking use included Cigarettes. She has a 12.00 pack-year smoking history. She has never used smokeless tobacco. She reports that she does not drink alcohol or use drugs.  Past Medical History:  Diagnosis Date  . Allergy    SEASONAL  . Fibroid    no surgery  . Heart murmur   . Hyperlipidemia   . Hypothyroidism   . Migraines   . Mild intermittent asthma 07/20/2015  . Osteopenia   . Vitamin D deficiency     Past Surgical History:  Procedure Laterality Date  . COLONOSCOPY  2014   negative , Dr Ardis Hughs  . cortisone injections     in left hand  . LUMBAR FUSION     Dr Hal Neer  . POLYPECTOMY     uterine  . SEPTOPLASTY    . SINOSCOPY    . TUBAL LIGATION      Current Outpatient Prescriptions  Medication Sig Dispense Refill  . albuterol (PROAIR HFA) 108 (90 Base)  MCG/ACT inhaler Inhale 2 puffs into the lungs every 4 (four) hours as needed for wheezing. Or coughing spells. (Patient taking differently: Inhale 2 puffs into the lungs every 4 (four) hours as needed for wheezing (using 1 puff daily prn). Or coughing spells.) 1 Inhaler 1  . Ascorbic Acid (VITAMIN C) 1000 MG tablet Take 1,000 mg by mouth daily.      Marland Kitchen aspirin 81 MG tablet Take 81 mg by mouth daily.    Marland Kitchen azelastine (ASTELIN) 0.1 % nasal spray USE 1 SPRAY IN EACH NOSTRIL TWICE A DAY AS DIRECTED 30 mL 3  . butalbital-aspirin-caffeine (FIORINAL) 50-325-40 MG per capsule TAKE ONE CAPSULE BY MOUTH EVERY 6 HOURS AS NEEDED FOR HEADACHE 30 capsule 0  . Cholecalciferol (VITAMIN D3) 1000 UNITS CAPS Take by mouth daily.      Marland Kitchen estradiol (VIVELLE-DOT) 0.05 MG/24HR patch PLACE 1 PATCH (0.05 MG TOTAL) ONTO THE SKIN 2 (TWO) TIMES A WEEK. 24 patch 0  . fluticasone (FLONASE) 50 MCG/ACT nasal spray USE 2 SPRAYS IN EACH NOSTRIL EVERY DAY FOR STUFFY NOSE OR DRAINAGE 16 g 5  . fluticasone (FLOVENT HFA) 44 MCG/ACT inhaler Inhale 2 puffs into the lungs 2 (  two) times daily. 1 Inhaler 5  . levothyroxine (SYNTHROID, LEVOTHROID) 112 MCG tablet TAKE 1 TABLET BY MOUTH EVERY DAY BEFORE BREAKFAST 90 tablet 1  . Multiple Vitamin (MULTIVITAMIN PO) Take by mouth daily.      . pravastatin (PRAVACHOL) 20 MG tablet TAKE 1 TABLET BY MOUTH EVERY DAY 90 tablet 3  . predniSONE (DELTASONE) 10 MG tablet Take 1 tablet (10 mg total) by mouth daily with breakfast. 30 tablet 0  . progesterone (PROMETRIUM) 100 MG capsule TAKE ONE CAPSULE BY MOUTH EVERY DAY 90 capsule 0   No current facility-administered medications for this visit.     Family History  Problem Relation Age of Onset  . Hypertension Mother   . Heart disease Mother        CHF; AS  . Osteoporosis Mother   . Heart attack Mother 42  . Allergic rhinitis Mother   . Diabetes Father   . Stroke Father        onset in 65s  . Cancer Brother        malignant brain tumor  . Colon  cancer Neg Hx   . Esophageal cancer Neg Hx   . Rectal cancer Neg Hx   . Stomach cancer Neg Hx   . Angioedema Neg Hx   . Asthma Neg Hx   . Eczema Neg Hx   . Immunodeficiency Neg Hx   . Urticaria Neg Hx     ROS:  Pertinent items are noted in HPI.  Otherwise, a comprehensive ROS was negative.  Exam:   BP 108/62 (BP Location: Right Arm, Patient Position: Sitting, Cuff Size: Normal)   Pulse 76   Resp 16   Ht 5\' 3"  (1.6 m)   Wt 141 lb (64 kg)   LMP 07/04/2000   BMI 24.98 kg/m   Weight change: +2#  Height: 5\' 3"  (160 cm)  Ht Readings from Last 3 Encounters:  11/11/16 5\' 3"  (1.6 m)  11/10/16 5\' 4"  (1.626 m)  10/28/16 5\' 2"  (1.575 m)    General appearance: alert, cooperative and appears stated age Head: Normocephalic, without obvious abnormality, atraumatic Neck: no adenopathy, supple, symmetrical, trachea midline and thyroid normal to inspection and palpation Lungs: clear to auscultation bilaterally Breasts: normal appearance, no masses or tenderness Heart: regular rate and rhythm Abdomen: soft, non-tender; bowel sounds normal; no masses,  no organomegaly Extremities: extremities normal, atraumatic, no cyanosis or edema Skin: Skin color, texture, turgor normal. No rashes or lesions Lymph nodes: Cervical, supraclavicular, and axillary nodes normal. No abnormal inguinal nodes palpated Neurologic: Grossly normal   Pelvic: External genitalia:  no lesions              Urethra:  normal appearing urethra with no masses, tenderness or lesions              Bartholins and Skenes: normal                 Vagina: normal appearing vagina with normal color and discharge, no lesions              Cervix: no lesions              Pap taken: No. Bimanual Exam:  Uterus:  normal size, contour, position, consistency, mobility, non-tender              Adnexa: normal adnexa and no mass, fullness, tenderness               Rectovaginal: Confirms  Anus:  normal sphincter tone, no  lesions  Chaperone was present for exam.  A:  Well Woman with normal exam PMP, no low dosed HRT.  Tried to stop again this year and was unsuccessful. Hypothyroidism Elevated lipids Osteopenia  P:   Mammogram guidelines reviewed.  Doing yearly MMGs. pap smear not obtained today.  Last pap 1/17 was negative. Will switch to estradiol 0.5mg  tablets, 1/2 tablet daily and Prometrium 100mg  daily.  90 day supplies to pharmacy on file.  Clearly aware of risks. Lab work UTD with Dr. Quay Burow return annually or prn

## 2016-11-11 ENCOUNTER — Encounter: Payer: Self-pay | Admitting: Obstetrics & Gynecology

## 2016-11-11 ENCOUNTER — Ambulatory Visit (INDEPENDENT_AMBULATORY_CARE_PROVIDER_SITE_OTHER): Payer: Medicare Other | Admitting: Obstetrics & Gynecology

## 2016-11-11 VITALS — BP 108/62 | HR 76 | Resp 16 | Ht 63.0 in | Wt 141.0 lb

## 2016-11-11 DIAGNOSIS — Z01419 Encounter for gynecological examination (general) (routine) without abnormal findings: Secondary | ICD-10-CM | POA: Diagnosis not present

## 2016-11-11 MED ORDER — ESTRADIOL 0.5 MG PO TABS: ORAL_TABLET | ORAL | 3 refills | Status: DC

## 2016-11-11 MED ORDER — PROGESTERONE MICRONIZED 100 MG PO CAPS: 100.0000 mg | ORAL_CAPSULE | Freq: Every day | ORAL | 4 refills | Status: DC

## 2016-12-20 ENCOUNTER — Other Ambulatory Visit: Payer: Self-pay | Admitting: Internal Medicine

## 2016-12-20 DIAGNOSIS — Z1231 Encounter for screening mammogram for malignant neoplasm of breast: Secondary | ICD-10-CM

## 2016-12-22 ENCOUNTER — Other Ambulatory Visit: Payer: Self-pay

## 2016-12-22 MED ORDER — AZELASTINE HCL 0.1 % NA SOLN: NASAL | 0 refills | Status: DC

## 2016-12-28 ENCOUNTER — Other Ambulatory Visit: Payer: Self-pay | Admitting: Internal Medicine

## 2016-12-28 ENCOUNTER — Other Ambulatory Visit (INDEPENDENT_AMBULATORY_CARE_PROVIDER_SITE_OTHER): Payer: Self-pay | Admitting: Orthopedic Surgery

## 2016-12-29 NOTE — Telephone Encounter (Signed)
Pt given rx for prednisone for LBP hx of lumbar surgery with Dr. Beatrix Fetters. Pt wanting to refill please advise.

## 2017-01-26 ENCOUNTER — Ambulatory Visit
Admission: RE | Admit: 2017-01-26 | Discharge: 2017-01-26 | Disposition: A | Discharge status: Home / Self Care | Payer: Medicare Other | Source: Ambulatory Visit | Attending: Internal Medicine | Admitting: Internal Medicine

## 2017-01-26 DIAGNOSIS — Z1231 Encounter for screening mammogram for malignant neoplasm of breast: Secondary | ICD-10-CM

## 2017-02-07 ENCOUNTER — Ambulatory Visit (INDEPENDENT_AMBULATORY_CARE_PROVIDER_SITE_OTHER): Payer: Medicare Other | Admitting: Internal Medicine

## 2017-02-07 ENCOUNTER — Encounter: Payer: Self-pay | Admitting: Internal Medicine

## 2017-02-07 ENCOUNTER — Other Ambulatory Visit: Payer: Medicare Other

## 2017-02-07 VITALS — BP 132/82 | HR 86 | Temp 98.2°F | Resp 16 | Wt 142.0 lb

## 2017-02-07 DIAGNOSIS — R300 Dysuria: Secondary | ICD-10-CM

## 2017-02-07 LAB — POCT URINALYSIS DIPSTICK
BILIRUBIN UA: NEGATIVE
Blood, UA: 10
GLUCOSE UA: NEGATIVE
Ketones, UA: NEGATIVE
LEUKOCYTES UA: NEGATIVE
Nitrite, UA: NEGATIVE
Protein, UA: NEGATIVE
Spec Grav, UA: 1.015 (ref 1.010–1.025)
Urobilinogen, UA: 0.2 E.U./dL
pH, UA: 6 (ref 5.0–8.0)

## 2017-02-07 MED ORDER — PHENAZOPYRIDINE HCL 100 MG PO TABS: 100.0000 mg | ORAL_TABLET | Freq: Three times a day (TID) | ORAL | 0 refills | Status: DC | PRN

## 2017-02-07 NOTE — Progress Notes (Signed)
Subjective:    Patient ID: Pamela Rich, female    DOB: May 21, 1946, 71 y.o.   MRN: [id number]  HPI She is here for an acute visit.   Dysuria:  Yesterday she was having increased urinary frequent and was not urinating that much when she went.  Last night she had some discomfort in her suprapubic region. Her urine also looked cloudy.  She was concerned about a possible UTI.  She denies hematuria, nausea and back pain.  She denies fever/chills.   Last week she had some diarrhea for a couple of days and was concerned that may have increased her chance of a UTI.     Medications and allergies reviewed with patient and updated if appropriate.  Patient Active Problem List   Diagnosis Date Noted  . Chronic pain of both knees 06/21/2016  . Mild sinusitis 06/07/2016  . Mild persistent asthma 07/20/2015  . Other allergic rhinitis 07/20/2015  . Mixed hyperlipidemia 03/01/2012  . Migraine without aura 04/05/2010  . FACIAL PARESTHESIA, LEFT 10/16/2009  . Vitamin D deficiency 03/19/2009  . DEGENERATIVE JOINT DISEASE 12/05/2007  . NOCTURIA 12/05/2007  . Hypothyroidism 01/22/2007  . IBS 09/28/2006  . Osteopenia 09/28/2006    Current Outpatient Prescriptions on File Prior to Visit  Medication Sig Dispense Refill  . albuterol (PROAIR HFA) 108 (90 Base) MCG/ACT inhaler Inhale 2 puffs into the lungs every 4 (four) hours as needed for wheezing. Or coughing spells. (Patient taking differently: Inhale 2 puffs into the lungs every 4 (four) hours as needed for wheezing (using 1 puff daily prn). Or coughing spells.) 1 Inhaler 1  . Ascorbic Acid (VITAMIN C) 1000 MG tablet Take 1,000 mg by mouth daily.      Marland Kitchen aspirin 81 MG tablet Take 81 mg by mouth daily.    Marland Kitchen azelastine (ASTELIN) 0.1 % nasal spray USE 1 SPRAY IN EACH NOSTRIL TWICE A DAY AS DIRECTED 90 mL 0  . butalbital-aspirin-caffeine (FIORINAL) 50-325-40 MG per capsule TAKE ONE CAPSULE BY MOUTH EVERY 6 HOURS AS NEEDED FOR HEADACHE 30 capsule 0    . Cholecalciferol (VITAMIN D3) 1000 UNITS CAPS Take by mouth daily.      Marland Kitchen estradiol (ESTRACE) 0.5 MG tablet 1/2 tab daily 45 tablet 3  . fluticasone (FLONASE) 50 MCG/ACT nasal spray USE 2 SPRAYS IN EACH NOSTRIL EVERY DAY FOR STUFFY NOSE OR DRAINAGE 16 g 5  . fluticasone (FLOVENT HFA) 44 MCG/ACT inhaler Inhale 2 puffs into the lungs 2 (two) times daily. 1 Inhaler 5  . levothyroxine (SYNTHROID, LEVOTHROID) 112 MCG tablet TAKE 1 TABLET BY MOUTH EVERY DAY BEFORE BREAKFAST 90 tablet 1  . Multiple Vitamin (MULTIVITAMIN PO) Take by mouth daily.      . pravastatin (PRAVACHOL) 20 MG tablet TAKE 1 TABLET BY MOUTH EVERY DAY 90 tablet 3  . predniSONE (DELTASONE) 10 MG tablet TAKE 1 TABLET (10 MG TOTAL) BY MOUTH DAILY WITH BREAKFAST. 30 tablet 0  . progesterone (PROMETRIUM) 100 MG capsule Take 1 capsule (100 mg total) by mouth daily. 90 capsule 4   No current facility-administered medications on file prior to visit.     Past Medical History:  Diagnosis Date  . Allergy    SEASONAL  . Fibroid    no surgery  . Heart murmur   . Hyperlipidemia   . Hypothyroidism   . Migraines   . Mild intermittent asthma 07/20/2015  . Osteopenia   . Vitamin D deficiency     Past Surgical History:  Procedure  Laterality Date  . COLONOSCOPY  2014   negative , Dr Ardis Hughs  . cortisone injections     in left hand  . LUMBAR FUSION     Dr Hal Neer  . POLYPECTOMY     uterine  . SEPTOPLASTY    . SINOSCOPY    . TUBAL LIGATION      Social History   Social History  . Marital status: Widowed    Spouse name: N/A  . Number of children: 3  . Years of education: N/A   Occupational History  . retired    Social History Main Topics  . Smoking status: Former Smoker    Packs/day: 0.30    Years: 40.00    Types: Cigarettes    Quit date: 07/04/1974  . Smokeless tobacco: Never Used     Comment: smoked 1965-1976 , < 3 cig/ day  . Alcohol use No     Comment: rarely  . Drug use: No  . Sexual activity: No     Comment:  BTL   Other Topics Concern  . Not on file   Social History Narrative   Exercise: walking, stretching, weights    Family History  Problem Relation Age of Onset  . Hypertension Mother   . Heart disease Mother        CHF; AS  . Osteoporosis Mother   . Heart attack Mother 46  . Allergic rhinitis Mother   . Diabetes Father   . Stroke Father        onset in 54s  . Cancer Brother        malignant brain tumor  . Colon cancer Neg Hx   . Esophageal cancer Neg Hx   . Rectal cancer Neg Hx   . Stomach cancer Neg Hx   . Angioedema Neg Hx   . Asthma Neg Hx   . Eczema Neg Hx   . Immunodeficiency Neg Hx   . Urticaria Neg Hx     Review of Systems  Constitutional: Negative for chills and fever.  Gastrointestinal: Positive for abdominal pain (some discomfort in lower abdomen). Negative for nausea.  Genitourinary: Positive for dysuria and frequency. Negative for hematuria.  Musculoskeletal: Negative for back pain.  Neurological: Positive for light-headedness and headaches.       Objective:   Vitals:   02/07/17 1010  BP: 132/82  Pulse: 86  Resp: 16  Temp: 98.2 F (36.8 C)   Filed Weights   02/07/17 1010  Weight: 142 lb (64.4 kg)   Body mass index is 25.15 kg/m.  Wt Readings from Last 3 Encounters:  02/07/17 142 lb (64.4 kg)  11/11/16 141 lb (64 kg)  11/10/16 140 lb (63.5 kg)     Physical Exam  Constitutional: She appears well-developed and well-nourished. No distress.  Abdominal: Soft. She exhibits no distension. There is tenderness (minimal in suprapubic tenderness). There is no rebound and no guarding.  Genitourinary:  Genitourinary Comments: No cva tenderness  Skin: Skin is warm and dry. She is not diaphoretic.          Assessment & Plan:    See Problem List for Assessment and Plan of chronic medical problems.

## 2017-02-07 NOTE — Patient Instructions (Addendum)
Your preliminary urine test does not show an infection.  We will send the urine for a culture to see if there is an infection.  We will call you with the results.  If your symptoms worsen or do not improve please let me know.   Increase your fluids.  Take the pyridium as needed for painful urination.

## 2017-02-07 NOTE — Assessment & Plan Note (Signed)
Urinalysis not convincing of a UTI Will send for culture Pyridium prn for dysuria Increase fluids If symptoms do not improve or worsen over the next day or so she will call  Will call her with the culture results.

## 2017-02-09 ENCOUNTER — Other Ambulatory Visit: Payer: Self-pay | Admitting: Internal Medicine

## 2017-02-09 LAB — URINE CULTURE

## 2017-02-09 MED ORDER — SULFAMETHOXAZOLE-TRIMETHOPRIM 800-160 MG PO TABS: 1.0000 | ORAL_TABLET | Freq: Two times a day (BID) | ORAL | 0 refills | Status: DC

## 2017-02-13 ENCOUNTER — Other Ambulatory Visit: Payer: Self-pay | Admitting: Obstetrics & Gynecology

## 2017-02-13 NOTE — Telephone Encounter (Signed)
Spoke with patient. Patient states at last OV 11/11/16, switched from estradiol patch 0.05mg  d/t insurance coverage to 0.5 mg estradiol po 1/2 tablet daily . Also take progesterone 100 mg daily. Patient states she has been taking for almost 2 months now with no changes in hot flashes or sleep. Patients states she was advised to call if not working. Patient requesting alternative medication or recommendation prior to refilling estradiol.  Advised patient would review with Dr. Sabra Heck and return call with recommendations. Advised patient Dr. Sabra Heck is seeing patients, response may not be immediate.   Dr. Sabra Heck- please advise on med change?

## 2017-02-13 NOTE — Telephone Encounter (Signed)
Left message to call Tassie Pollett at [phone].  

## 2017-02-13 NOTE — Telephone Encounter (Signed)
Patient is taking Estradiol 0.5mg  and this is not working. Patient states she is still having night sweats. Patient wants to know if there is something else she can take?

## 2017-02-13 NOTE — Telephone Encounter (Signed)
I am not going to increase the estrogen.  She can add Neurontin 100mg  nightly.  Please send in rx for #30 and ask her give a call in one week.  May need to increase this dosage.  Thanks.

## 2017-02-14 MED ORDER — GABAPENTIN 100 MG PO CAPS: 100.0000 mg | ORAL_CAPSULE | Freq: Every day | ORAL | 0 refills | Status: DC

## 2017-02-14 NOTE — Telephone Encounter (Signed)
Spoke with patient, advised as seen below per Dr. Sabra Heck. Gabapentin listed as active allergy, patient states she experienced constipation years ago when trying medication, was not sure if r/t medication or not. Patient would like to try again. Advised patient would review with Dr. Sabra Heck and return call, patient verbalizes understanding and is agreeable.  Dr. Sabra Heck -Gabapentin listed as allergy, ok to proceed with RX?

## 2017-02-14 NOTE — Telephone Encounter (Signed)
Constipation is not an allergy.  Please remove from allergy list.  Yes, ok to proceed.

## 2017-02-14 NOTE — Telephone Encounter (Signed)
Spoke with patient, advised as seen below per Dr. Sabra Heck.Aware allergies have been updated to reflect changes. Rx for Neurontin #30/0RF to verified pharmacy. Patient verbalizes understanding and is agreeable.   Patient is agreeable to disposition. Will close encounter.

## 2017-02-21 ENCOUNTER — Telehealth: Payer: Self-pay | Admitting: Obstetrics & Gynecology

## 2017-02-21 NOTE — Telephone Encounter (Signed)
Patient state Dr Sabra Heck gave her Gabapentin and that it does help with the night sweats but it keeps her awake.  Patient states she has stopped taking it.

## 2017-02-21 NOTE — Telephone Encounter (Signed)
Spoke with patient, calling with update on Gabapentin:  -Started 8/14, stopped 8/19. Reports taking at night,         helped with night sweats, made sleep more restless        and anxious.   - Slept better 8/20, night sweats returned after stopping.   Advised patient would update Dr. Sabra Heck and return call with recommendations, patient thankful and agreeable.   Dr. Sabra Heck -please review and advise?

## 2017-02-22 NOTE — Telephone Encounter (Signed)
I really think she should be off her HRT.  Does she want to consider using something very low dosage to help with sleep?

## 2017-02-22 NOTE — Telephone Encounter (Signed)
Spoke with patient, advised as seen below per Dr. Sabra Heck. Patient is agreeable to stopping estradiol and prometrium and trying something to help with sleep. Advised patient would review medication options with Dr. Sabra Heck when she returns to the office on 8/23 and return call with recommendations, patient is agreeable.    Dr. Sabra Heck -please review and advise?

## 2017-02-23 NOTE — Telephone Encounter (Signed)
I think she needs OV as options are varied with some specific considerations with women >71yo.  Thanks.

## 2017-02-23 NOTE — Telephone Encounter (Signed)
Spoke with patient, advised as seen below per Dr. Sabra Heck. Patient scheduled for OV on 03/03/17 at 11:15am. Patient verbalizes understanding and is agreeable.  Patient is agreeable to disposition. Will close encounter.

## 2017-02-28 ENCOUNTER — Encounter (INDEPENDENT_AMBULATORY_CARE_PROVIDER_SITE_OTHER): Payer: Self-pay | Admitting: Orthopedic Surgery

## 2017-02-28 ENCOUNTER — Ambulatory Visit (INDEPENDENT_AMBULATORY_CARE_PROVIDER_SITE_OTHER): Payer: Medicare Other | Admitting: Orthopedic Surgery

## 2017-02-28 DIAGNOSIS — M5410 Radiculopathy, site unspecified: Secondary | ICD-10-CM | POA: Insufficient documentation

## 2017-02-28 DIAGNOSIS — M5416 Radiculopathy, lumbar region: Secondary | ICD-10-CM | POA: Insufficient documentation

## 2017-02-28 MED ORDER — DIAZEPAM 10 MG PO TABS: 10.0000 mg | ORAL_TABLET | Freq: Four times a day (QID) | ORAL | 0 refills | Status: DC | PRN

## 2017-02-28 NOTE — Progress Notes (Signed)
Office Visit Note   Patient: Pamela Rich           Date of Birth: 1946-04-11           MRN: [id number] Visit Date: 02/28/2017              Requested by: Binnie Rail, MD [place], [place] [id number] PCP: Binnie Rail, MD  Chief Complaint  Patient presents with  . Left Leg - Pain      HPI: Patient is a 71 year old woman who is status post one of our spine surgery. She was given a prescription for prednisone to try to treat her current left-sided radicular symptoms conservatively she could not tolerate the prednisone she has been having pain since she slipped on the ice in March. She has failed prolonged conservative therapy she is uncomfortable with sitting and laying down complains of numbness and tingling in her Botox radiating to her thigh and lateral leg. She has had plain radiographs which were negative.  Assessment & Plan: Visit Diagnoses:  1. Radicular pain of left lower extremity     Plan: Plan for an MRI scan with a Valium prescription provided follow-up after the MRI scan.  Follow-Up Instructions: Return in about 2 weeks (around 03/14/2017).   Ortho Exam  Patient is alert, oriented, no adenopathy, well-dressed, normal affect, normal respiratory effort. Examination patient has a normal gait. She has a positive straight leg raise on the left she has good motor strength in all motor groups of the left lower extremity with ankle plantar flexion dorsiflexion knee flexion and extension and hip flexion. There is no pain with range of motion of the hip near or ankle.  Imaging: No results found. No images are attached to the encounter.  Labs: Lab Results  Component Value Date   LABORGA ESCHERICHIA COLI 02/07/2017    Orders:  Orders Placed This Encounter  Procedures  . MR Lumbar Spine w/o contrast   Meds ordered this encounter  Medications  . diazepam (VALIUM) 10 MG tablet    Sig: Take 1 tablet (10 mg total) by mouth every 6 (six) hours as needed.  Take 1 tablet 30 minutes prior to procedure may repeat if needed.    Dispense:  5 tablet    Refill:  0     Procedures: No procedures performed  Clinical Data: No additional findings.  ROS:  All other systems negative, except as noted in the HPI. Review of Systems  Objective: Vital Signs: LMP 07/04/2000   Specialty Comments:  No specialty comments available.  PMFS History: Patient Active Problem List   Diagnosis Date Noted  . Radicular pain of left lower extremity 02/28/2017  . Dysuria 02/07/2017  . Chronic pain of both knees 06/21/2016  . Mild sinusitis 06/07/2016  . Mild persistent asthma 07/20/2015  . Other allergic rhinitis 07/20/2015  . Mixed hyperlipidemia 03/01/2012  . Migraine without aura 04/05/2010  . FACIAL PARESTHESIA, LEFT 10/16/2009  . Vitamin D deficiency 03/19/2009  . DEGENERATIVE JOINT DISEASE 12/05/2007  . NOCTURIA 12/05/2007  . Hypothyroidism 01/22/2007  . IBS 09/28/2006  . Osteopenia 09/28/2006   Past Medical History:  Diagnosis Date  . Allergy    SEASONAL  . Fibroid    no surgery  . Heart murmur   . Hyperlipidemia   . Hypothyroidism   . Migraines   . Mild intermittent asthma 07/20/2015  . Osteopenia   . Vitamin D deficiency     Family History  Problem Relation  Age of Onset  . Hypertension Mother   . Heart disease Mother        CHF; AS  . Osteoporosis Mother   . Heart attack Mother 53  . Allergic rhinitis Mother   . Diabetes Father   . Stroke Father        onset in 58s  . Cancer Brother        malignant brain tumor  . Colon cancer Neg Hx   . Esophageal cancer Neg Hx   . Rectal cancer Neg Hx   . Stomach cancer Neg Hx   . Angioedema Neg Hx   . Asthma Neg Hx   . Eczema Neg Hx   . Immunodeficiency Neg Hx   . Urticaria Neg Hx     Past Surgical History:  Procedure Laterality Date  . COLONOSCOPY  2014   negative , Dr Ardis Hughs  . cortisone injections     in left hand  . LUMBAR FUSION     Dr Hal Neer  . POLYPECTOMY      uterine  . SEPTOPLASTY    . SINOSCOPY    . TUBAL LIGATION     Social History   Occupational History  . retired    Social History Main Topics  . Smoking status: Former Smoker    Packs/day: 0.30    Years: 40.00    Types: Cigarettes    Quit date: 07/04/1974  . Smokeless tobacco: Never Used     Comment: smoked 1965-1976 , < 3 cig/ day  . Alcohol use No     Comment: rarely  . Drug use: No  . Sexual activity: No     Comment: BTL

## 2017-03-03 ENCOUNTER — Encounter: Payer: Self-pay | Admitting: Obstetrics & Gynecology

## 2017-03-03 ENCOUNTER — Ambulatory Visit (INDEPENDENT_AMBULATORY_CARE_PROVIDER_SITE_OTHER): Payer: Medicare Other | Admitting: Obstetrics & Gynecology

## 2017-03-03 VITALS — BP 120/60 | HR 80 | Resp 16 | Ht 63.0 in | Wt 141.0 lb

## 2017-03-03 DIAGNOSIS — N951 Menopausal and female climacteric states: Secondary | ICD-10-CM

## 2017-03-03 MED ORDER — PAROXETINE HCL 10 MG PO TABS: 10.0000 mg | ORAL_TABLET | Freq: Every day | ORAL | 2 refills | Status: DC

## 2017-03-03 NOTE — Progress Notes (Signed)
GYNECOLOGY  VISIT   HPI: 71 y.o. G4P3 Widowed Caucasian female here for discussion of HRT.  She has tried to stop the HRT in the past but states she has significant hot flashes.  She switched from patch to pills and her hot flashes are not as well controlled.  Tried Gabapentin to help her with hot flashes and night sweats but the medication make her feel "weird".  Took it for about one week.  She didn't have hot flasehs but just didn't like the way it made her feel.  Her biggest issue is sleep disturbance.  She and I discussed risks and benefits of sleep medications and we agreed together to not use these.  Also, discussed with pt SSRI use.  Risks and benefits discussed.    We also discussed how to wean down her HRT.  She is going to start taking the 1/2 tab every other day for a month and then every third day for a month and then stop.  She is going to keep taking the Prometrium for now and just slowly try to stop the estrogen.  GYNECOLOGIC HISTORY: Patient's last menstrual period was 07/04/2000. Contraception: PMP Menopausal hormone therapy: Estradiol 0.5mg  1/2 tab daily.  Prometrium 100mg  daily.  Patient Active Problem List   Diagnosis Date Noted  . Radicular pain of left lower extremity 02/28/2017  . Dysuria 02/07/2017  . Chronic pain of both knees 06/21/2016  . Mild sinusitis 06/07/2016  . Mild persistent asthma 07/20/2015  . Other allergic rhinitis 07/20/2015  . Mixed hyperlipidemia 03/01/2012  . Migraine without aura 04/05/2010  . FACIAL PARESTHESIA, LEFT 10/16/2009  . Vitamin D deficiency 03/19/2009  . DEGENERATIVE JOINT DISEASE 12/05/2007  . NOCTURIA 12/05/2007  . Hypothyroidism 01/22/2007  . IBS 09/28/2006  . Osteopenia 09/28/2006    Past Medical History:  Diagnosis Date  . Allergy    SEASONAL  . Fibroid    no surgery  . Heart murmur   . Hyperlipidemia   . Hypothyroidism   . Migraines   . Mild intermittent asthma 07/20/2015  . Osteopenia   . Sciatic nerve lesion  2018  . Vitamin D deficiency     Past Surgical History:  Procedure Laterality Date  . COLONOSCOPY  2014   negative , Dr Ardis Hughs  . cortisone injections     in left hand  . LUMBAR FUSION     Dr Hal Neer  . POLYPECTOMY     uterine  . SEPTOPLASTY    . SINOSCOPY    . TUBAL LIGATION      MEDS:   Current Outpatient Prescriptions on File Prior to Visit  Medication Sig Dispense Refill  . albuterol (PROAIR HFA) 108 (90 Base) MCG/ACT inhaler Inhale 2 puffs into the lungs every 4 (four) hours as needed for wheezing. Or coughing spells. (Patient taking differently: Inhale 2 puffs into the lungs every 4 (four) hours as needed for wheezing (using 1 puff daily prn). Or coughing spells.) 1 Inhaler 1  . Ascorbic Acid (VITAMIN C) 1000 MG tablet Take 1,000 mg by mouth daily.      Marland Kitchen aspirin 81 MG tablet Take 81 mg by mouth daily.    Marland Kitchen azelastine (ASTELIN) 0.1 % nasal spray USE 1 SPRAY IN EACH NOSTRIL TWICE A DAY AS DIRECTED 90 mL 0  . butalbital-aspirin-caffeine (FIORINAL) 50-325-40 MG per capsule TAKE ONE CAPSULE BY MOUTH EVERY 6 HOURS AS NEEDED FOR HEADACHE 30 capsule 0  . Cholecalciferol (VITAMIN D3) 1000 UNITS CAPS Take by mouth daily.      Marland Kitchen  diazepam (VALIUM) 10 MG tablet Take 1 tablet (10 mg total) by mouth every 6 (six) hours as needed. Take 1 tablet 30 minutes prior to procedure may repeat if needed. 5 tablet 0  . estradiol (ESTRACE) 0.5 MG tablet 1/2 tab daily 45 tablet 3  . fluticasone (FLONASE) 50 MCG/ACT nasal spray USE 2 SPRAYS IN EACH NOSTRIL EVERY DAY FOR STUFFY NOSE OR DRAINAGE 16 g 5  . fluticasone (FLOVENT HFA) 44 MCG/ACT inhaler Inhale 2 puffs into the lungs 2 (two) times daily. 1 Inhaler 5  . levothyroxine (SYNTHROID, LEVOTHROID) 112 MCG tablet TAKE 1 TABLET BY MOUTH EVERY DAY BEFORE BREAKFAST 90 tablet 1  . Multiple Vitamin (MULTIVITAMIN PO) Take by mouth daily.      . pravastatin (PRAVACHOL) 20 MG tablet TAKE 1 TABLET BY MOUTH EVERY DAY 90 tablet 3  . progesterone (PROMETRIUM) 100  MG capsule Take 1 capsule (100 mg total) by mouth daily. 90 capsule 4   No current facility-administered medications on file prior to visit.     ALLERGIES: Pneumococcal vaccines  Family History  Problem Relation Age of Onset  . Hypertension Mother   . Heart disease Mother        CHF; AS  . Osteoporosis Mother   . Heart attack Mother 36  . Allergic rhinitis Mother   . Diabetes Father   . Stroke Father        onset in 78s  . Cancer Brother        malignant brain tumor  . Colon cancer Neg Hx   . Esophageal cancer Neg Hx   . Rectal cancer Neg Hx   . Stomach cancer Neg Hx   . Angioedema Neg Hx   . Asthma Neg Hx   . Eczema Neg Hx   . Immunodeficiency Neg Hx   . Urticaria Neg Hx     SH:  Married, non smoker  Review of Systems  Constitutional: Negative.   Respiratory: Negative.   Cardiovascular: Negative.   Psychiatric/Behavioral: Negative.     PHYSICAL EXAMINATION:    BP 120/60 (BP Location: Right Arm, Patient Position: Sitting, Cuff Size: Normal)   Pulse 80   Resp 16   Ht 5\' 3"  (1.6 m)   Wt 141 lb (64 kg)   LMP 07/04/2000   BMI 24.98 kg/m     General appearance: alert, cooperative and appears stated age No exam performed  Assessment: PMP female on low dosed HRT, trying to wean off  Plan: Will start Paxil 10mg  daily.  She will take this for two to three weeks and then start to decrease the estradiol to every other day for a month and then every third day for a month.  Then she will stop the estrogen completely.  She knows to call with any concerns or problems.   ~15 minutes spent with patient >50% of time was in face to face discussion of above.

## 2017-03-03 NOTE — Patient Instructions (Signed)
Take the paxil daily for the next three weeks.   If you are not having any side effects, you should start taking the estradiol every other day.   After one more month, take every 3 day. After one more month, then stop.

## 2017-03-06 ENCOUNTER — Encounter: Payer: Self-pay | Admitting: Obstetrics & Gynecology

## 2017-03-10 ENCOUNTER — Telehealth: Payer: Self-pay

## 2017-03-10 NOTE — Telephone Encounter (Signed)
PA for Paroxetine 10 mg tablets submitted via Cover My Meds on 03/10/2017.

## 2017-03-13 NOTE — Telephone Encounter (Signed)
Spoke with patient. Advised of message as seen below from Dr.Miller. Patient verbalizes understanding. Encounter closed. 

## 2017-03-13 NOTE — Telephone Encounter (Signed)
Spoke with patient. Advised PA for Paroxetine was approved. Patient states she was able to pick up rx. Has taken two doses thus far. Reports shortly after taking the medication she gets a headache. Has had blurred vision after taking and is not able to sleep. Is going to stop taking Paroxetine. Denies headache or blurred vision at this time. Patient states "I am going to have to wean off the estrogen without a medication because I do not like these side effects. They don't agree with me. Asking if Dr.Miller has any additional recommendations.

## 2017-03-13 NOTE — Telephone Encounter (Signed)
No.  I do not have any additional recommendations for her.  Thanks.

## 2017-03-14 ENCOUNTER — Ambulatory Visit
Admission: RE | Admit: 2017-03-14 | Discharge: 2017-03-14 | Disposition: A | Discharge status: Home / Self Care | Payer: Medicare Other | Source: Ambulatory Visit | Attending: Orthopedic Surgery | Admitting: Orthopedic Surgery

## 2017-03-14 DIAGNOSIS — M5410 Radiculopathy, site unspecified: Secondary | ICD-10-CM

## 2017-03-16 ENCOUNTER — Ambulatory Visit (INDEPENDENT_AMBULATORY_CARE_PROVIDER_SITE_OTHER): Payer: Medicare Other | Admitting: Orthopedic Surgery

## 2017-03-16 ENCOUNTER — Other Ambulatory Visit: Payer: Self-pay | Admitting: Allergy and Immunology

## 2017-03-16 DIAGNOSIS — M5410 Radiculopathy, site unspecified: Secondary | ICD-10-CM

## 2017-03-16 NOTE — Progress Notes (Signed)
Office Visit Note   Patient: Pamela Rich           Date of Birth: 01-17-1946           MRN: [id number] Visit Date: 03/16/2017              Requested by: Binnie Rail, MD [place], [place] [address] PCP: Binnie Rail, MD  Chief Complaint  Patient presents with  . Lower Back - Follow-up      HPI: Patient is a 71 year old woman who is still having left sided radicular pain. She states it's worse the longer she is up on her feet with pain radiating from the left buttocks down the lateral aspect of the left thigh and left calf. Pain does not go to her foot.  Assessment & Plan: Visit Diagnoses:  1. Radicular pain of left lower extremity     Plan: We will have patient follow-up with Dr. Ernestina Patches for evaluation for transforaminal injection for L4-5 on the left.  Follow-Up Instructions: Return if symptoms worsen or fail to improve.   Ortho Exam  Patient is alert, oriented, no adenopathy, well-dressed, normal affect, normal respiratory effort. Examination patient has a normal gait. She has negative straight leg raise bilaterally with no focal motor weakness extremity. Her MRI scan shows a severe loss of disc space height at L4-5 with shallow disc bulge endplate spurring with mild to moderate foraminal narrowing worse on the left.  Imaging: No results found. No images are attached to the encounter.  Labs: Lab Results  Component Value Date   LABORGA ESCHERICHIA COLI 02/07/2017    Orders:  Orders Placed This Encounter  Procedures  . Ambulatory referral to Physical Medicine Rehab   No orders of the defined types were placed in this encounter.    Procedures: No procedures performed  Clinical Data: No additional findings.  ROS:  All other systems negative, except as noted in the HPI. Review of Systems  Objective: Vital Signs: LMP 07/04/2000   Specialty Comments:  No specialty comments available.  PMFS History: Patient Active Problem List   Diagnosis Date Noted  . Radicular pain of left lower extremity 02/28/2017  . Dysuria 02/07/2017  . Chronic pain of both knees 06/21/2016  . Mild sinusitis 06/07/2016  . Mild persistent asthma 07/20/2015  . Other allergic rhinitis 07/20/2015  . Mixed hyperlipidemia 03/01/2012  . Migraine without aura 04/05/2010  . FACIAL PARESTHESIA, LEFT 10/16/2009  . Vitamin D deficiency 03/19/2009  . DEGENERATIVE JOINT DISEASE 12/05/2007  . NOCTURIA 12/05/2007  . Hypothyroidism 01/22/2007  . IBS 09/28/2006  . Osteopenia 09/28/2006   Past Medical History:  Diagnosis Date  . Allergy    SEASONAL  . Fibroid    no surgery  . Heart murmur   . Hyperlipidemia   . Hypothyroidism   . Migraines   . Mild intermittent asthma 07/20/2015  . Osteopenia   . Sciatic nerve lesion 2018  . Vitamin D deficiency     Family History  Problem Relation Age of Onset  . Hypertension Mother   . Heart disease Mother        CHF; AS  . Osteoporosis Mother   . Heart attack Mother 34  . Allergic rhinitis Mother   . Diabetes Father   . Stroke Father        onset in 46s  . Cancer Brother        malignant brain tumor  . Colon cancer Neg Hx   .  Esophageal cancer Neg Hx   . Rectal cancer Neg Hx   . Stomach cancer Neg Hx   . Angioedema Neg Hx   . Asthma Neg Hx   . Eczema Neg Hx   . Immunodeficiency Neg Hx   . Urticaria Neg Hx     Past Surgical History:  Procedure Laterality Date  . COLONOSCOPY  2014   negative , Dr Ardis Hughs  . cortisone injections     in left hand  . LUMBAR FUSION     Dr Hal Neer  . POLYPECTOMY     uterine  . SEPTOPLASTY    . SINOSCOPY    . TUBAL LIGATION     Social History   Occupational History  . retired    Social History Main Topics  . Smoking status: Former Smoker    Packs/day: 0.30    Years: 40.00    Types: Cigarettes    Quit date: 07/04/1974  . Smokeless tobacco: Never Used     Comment: smoked 1965-1976 , < 3 cig/ day  . Alcohol use No     Comment: rarely  . Drug use: No   . Sexual activity: No     Comment: BTL

## 2017-03-17 ENCOUNTER — Encounter (INDEPENDENT_AMBULATORY_CARE_PROVIDER_SITE_OTHER): Payer: Self-pay | Admitting: Orthopedic Surgery

## 2017-03-28 ENCOUNTER — Ambulatory Visit (INDEPENDENT_AMBULATORY_CARE_PROVIDER_SITE_OTHER): Payer: Medicare Other

## 2017-03-28 ENCOUNTER — Ambulatory Visit (INDEPENDENT_AMBULATORY_CARE_PROVIDER_SITE_OTHER): Payer: Medicare Other | Admitting: Physical Medicine and Rehabilitation

## 2017-03-28 VITALS — BP 140/83 | HR 72

## 2017-03-28 DIAGNOSIS — M5416 Radiculopathy, lumbar region: Secondary | ICD-10-CM | POA: Diagnosis not present

## 2017-03-28 MED ORDER — LIDOCAINE HCL (PF) 1 % IJ SOLN
2.0000 mL | Freq: Once | INTRAMUSCULAR | Status: AC
  Administered 2017-03-28: 2 mL

## 2017-03-28 MED ORDER — BETAMETHASONE SOD PHOS & ACET 6 (3-3) MG/ML IJ SUSP
12.0000 mg | Freq: Once | INTRAMUSCULAR | Status: AC
  Administered 2017-03-28: 12 mg

## 2017-03-28 NOTE — Progress Notes (Deleted)
Left side low back pain. Pain down left leg to just above foot. "Moves around" Has to pick leg up to get in and out of car. Occasional numbness in leg.

## 2017-03-28 NOTE — Patient Instructions (Signed)

## 2017-04-02 NOTE — Procedures (Signed)
Pamela Rich is a 71 year old female sent to [place] today at the request of Dr. Sharol Given for possible diagnostic and therapeutic left L4 transforaminal epidural steroid injection. She has an MRI showing subarticular disc protrusion at L3-4 as well as moderate foraminal narrowing at L4-5. One of these could irritate an L4 nerve root and this is consistent with her symptoms. She is having left-sided low back pain not responding to conservative care with medications and time and therapy. The pain travels down the left leg to just above the foot. She reports having to move her leg to get in and out of a car. She gets occasional paresthesia. No groin pain.The injection  will be diagnostic and hopefully therapeutic. The patient has failed conservative care including time, medications and activity modification.  Lumbosacral Transforaminal Epidural Steroid Injection - Sub-Pedicular Approach with Fluoroscopic Guidance  Patient: Pamela Rich      Date of Birth: September 07, 1945 MRN: [id number] PCP: Binnie Rail, MD      Visit Date: 03/28/2017   Universal Protocol:    Date/Time: 03/28/2017  Consent Given By: the patient  Position: PRONE  Additional Comments: Vital signs were monitored before and after the procedure. Patient was prepped and draped in the usual sterile fashion. The correct patient, procedure, and site was verified.   Injection Procedure Details:  Procedure Site One Meds Administered:  Meds ordered this encounter  Medications  . lidocaine (PF) (XYLOCAINE) 1 % injection 2 mL  . betamethasone acetate-betamethasone sodium phosphate (CELESTONE) injection 12 mg    Laterality: Left  Location/Site:  L4-L5  Needle size: 22 G  Needle type: Spinal  Needle Placement: Transforaminal  Findings:  -Contrast Used: 1 mL iohexol 180 mg iodine/mL   -Comments: Excellent flow of contrast along the nerve and into the epidural space.  Procedure Details: After squaring off the end-plates to get a  true AP view, the C-arm was positioned so that an oblique view of the foramen as noted above was visualized. The target area is just inferior to the "nose of the scotty dog" or sub pedicular. The soft tissues overlying this structure were infiltrated with 2-3 ml. of 1% Lidocaine without Epinephrine.  The spinal needle was inserted toward the target using a "trajectory" view along the fluoroscope beam.  Under AP and lateral visualization, the needle was advanced so it did not puncture dura and was located close the 6 O'Clock position of the pedical in AP tracterory. Biplanar projections were used to confirm position. Aspiration was confirmed to be negative for CSF and/or blood. A 1-2 ml. volume of Isovue-250 was injected and flow of contrast was noted at each level. Radiographs were obtained for documentation purposes.   After attaining the desired flow of contrast documented above, a 0.5 to 1.0 ml test dose of 0.25% Marcaine was injected into each respective transforaminal space.  The patient was observed for 90 seconds post injection.  After no sensory deficits were reported, and normal lower extremity motor function was noted,   the above injectate was administered so that equal amounts of the injectate were placed at each foramen (level) into the transforaminal epidural space.   Additional Comments:  The patient tolerated the procedure well Dressing: Band-Aid    Post-procedure details: Patient was observed during the procedure. Post-procedure instructions were reviewed.  Patient left the clinic in stable condition.

## 2017-04-04 ENCOUNTER — Ambulatory Visit (INDEPENDENT_AMBULATORY_CARE_PROVIDER_SITE_OTHER): Payer: Medicare Other

## 2017-04-04 DIAGNOSIS — Z23 Encounter for immunization: Secondary | ICD-10-CM | POA: Diagnosis not present

## 2017-06-13 ENCOUNTER — Other Ambulatory Visit: Payer: Self-pay | Admitting: Allergy and Immunology

## 2017-06-13 NOTE — Telephone Encounter (Signed)
Received fax for 90 day supply for Flonase. Patient was last seen 10/10/2016 and was to return in 4 months. Patient needs office visit.

## 2017-06-22 ENCOUNTER — Ambulatory Visit (INDEPENDENT_AMBULATORY_CARE_PROVIDER_SITE_OTHER): Payer: Medicare Other | Admitting: Internal Medicine

## 2017-06-22 ENCOUNTER — Other Ambulatory Visit (INDEPENDENT_AMBULATORY_CARE_PROVIDER_SITE_OTHER): Payer: Medicare Other

## 2017-06-22 ENCOUNTER — Encounter: Payer: Self-pay | Admitting: Internal Medicine

## 2017-06-22 VITALS — BP 132/86 | HR 70 | Temp 98.0°F | Resp 16 | Ht 63.0 in | Wt 143.0 lb

## 2017-06-22 DIAGNOSIS — E782 Mixed hyperlipidemia: Secondary | ICD-10-CM | POA: Diagnosis not present

## 2017-06-22 DIAGNOSIS — E039 Hypothyroidism, unspecified: Secondary | ICD-10-CM

## 2017-06-22 DIAGNOSIS — H9190 Unspecified hearing loss, unspecified ear: Secondary | ICD-10-CM | POA: Insufficient documentation

## 2017-06-22 DIAGNOSIS — Z0000 Encounter for general adult medical examination without abnormal findings: Secondary | ICD-10-CM | POA: Diagnosis not present

## 2017-06-22 LAB — COMPREHENSIVE METABOLIC PANEL
ALK PHOS: 66 U/L (ref 39–117)
ALT: 9 U/L (ref 0–35)
AST: 18 U/L (ref 0–37)
Albumin: 4.1 g/dL (ref 3.5–5.2)
BILIRUBIN TOTAL: 0.4 mg/dL (ref 0.2–1.2)
BUN: 14 mg/dL (ref 6–23)
CO2: 31 mEq/L (ref 19–32)
CREATININE: 0.77 mg/dL (ref 0.40–1.20)
Calcium: 9.7 mg/dL (ref 8.4–10.5)
Chloride: 103 mEq/L (ref 96–112)
GFR: 78.42 mL/min (ref >60.00)
GLUCOSE: 90 mg/dL (ref 70–99)
Potassium: 4.1 mEq/L (ref 3.5–5.1)
SODIUM: 140 meq/L (ref 135–145)
TOTAL PROTEIN: 7.3 g/dL (ref 6.0–8.3)

## 2017-06-22 LAB — LIPID PANEL
CHOLESTEROL: 175 mg/dL (ref 0–200)
HDL: 62.6 mg/dL (ref >39.00)
LDL Cholesterol: 81 mg/dL (ref 0–99)
NONHDL: 112.51
Total CHOL/HDL Ratio: 3
Triglycerides: 157 mg/dL — ABNORMAL HIGH (ref 0.0–149.0)
VLDL: 31.4 mg/dL (ref 0.0–40.0)

## 2017-06-22 LAB — CBC WITH DIFFERENTIAL/PLATELET
BASOS ABS: 0.1 10*3/uL (ref 0.0–0.1)
Basophils Relative: 0.8 % (ref 0.0–3.0)
EOS ABS: 0.2 10*3/uL (ref 0.0–0.7)
Eosinophils Relative: 2.7 % (ref 0.0–5.0)
HCT: 42.3 % (ref 36.0–46.0)
Hemoglobin: 14.3 g/dL (ref 12.0–15.0)
LYMPHS ABS: 2.3 10*3/uL (ref 0.7–4.0)
Lymphocytes Relative: 28.3 % (ref 12.0–46.0)
MCHC: 33.9 g/dL (ref 30.0–36.0)
MCV: 94.3 fl (ref 78.0–100.0)
MONO ABS: 0.6 10*3/uL (ref 0.1–1.0)
Monocytes Relative: 7.8 % (ref 3.0–12.0)
NEUTROS PCT: 60.4 % (ref 43.0–77.0)
Neutro Abs: 5 10*3/uL (ref 1.4–7.7)
Platelets: 359 10*3/uL (ref 150.0–400.0)
RBC: 4.48 Mil/uL (ref 3.87–5.11)
RDW: 12.7 % (ref 11.5–15.5)
WBC: 8.3 10*3/uL (ref 4.0–10.5)

## 2017-06-22 LAB — TSH: TSH: 0.34 u[IU]/mL — AB (ref 0.35–4.50)

## 2017-06-22 NOTE — Progress Notes (Signed)
Subjective:    Patient ID: Pamela Rich, female    DOB: 1946/02/06, 71 y.o.   MRN: [id number]  HPI Here for medicare wellness exam and an annual physical exam.   I have personally reviewed and have noted 1.The patient's medical and social history 2.Their use of alcohol, tobacco or illicit drugs 3.Their current medications and supplements 4.The patient's functional ability including ADL's, fall risks, home                 safety risk and hearing or visual impairment. 5.Diet and physical activities 6.Evidence for depression or mood disorders 7.Care team reviewed  -   Orthopedics - Dr Sharol Given, pain management/ Phys medicine - Dr Ernestina Patches,  Gyn - Dr Sabra Heck,  Vascular surgery - Dr Donzetta Matters, Allergy - Dr Starling Manns,  Eye - Dr Claudean Kinds  Since being off HRT she is having hot flashes and night sweats.  She has some palpitations with these episodes.    Are there smokers in your home (other than you)? No  Risk Factors Exercise:  Walks several times a week, does a yoga- stretch class once a week Dietary issues discussed: well balanced - large amount of fruits, veges, lean meats, does a lot of cooking  Vitamin and supplement use:  MVI, vitamin C, Vitamin D  Opiod use:  none Side effects from medication:  n/a Does medications benefits outweigh risks/side effects:   N/a   Cardiac risk factors: advanced age, hyperlipidemia  Depression Screen  Have you felt down, depressed or hopeless? No  Have you felt little interest or pleasure in doing things?  No  Activities of Daily Living In your present state of health, do you have any difficulty performing the following activities?:  Driving? No Managing money?  No Feeding yourself? No Getting from bed to chair? No Climbing a flight of stairs? No Preparing food and eating?: No Bathing or showering? No Getting dressed: No Getting to/using the toilet? No Moving around from place to  place: No In the past year have you fallen or had a near fall?: yes - fell on ice last March   Are you sexually active?  No  Do you have more than one partner?  N/A  Hearing Difficulties:   Yes, wears hearing aids Do you often ask people to speak up or repeat themselves? yes Do you experience ringing or noises in your ears? No Do you have difficulty understanding soft or whispered voices? yes Vision:              Any change in vision:   no             Up to date with eye exam:   yes  Memory:  Do you feel that you have a problem with memory? No  Do you often misplace items? No  Do you feel safe at home?  Yes  Cognitive Testing  Alert, Orientated? Yes  Normal Appearance? Yes  Recall of three objects?  Yes  Can perform simple calculations? Yes  Displays appropriate judgment? Yes  Can read the correct time from a watch face? Yes   Advanced Directives have been discussed with the patient? Yes - in place     Medications and allergies reviewed with patient and updated if appropriate.  Patient Active Problem List   Diagnosis Date Noted  . Radicular pain of left lower extremity 02/28/2017  . Dysuria 02/07/2017  . Chronic pain of both knees 06/21/2016  . Mild sinusitis 06/07/2016  .  Mild persistent asthma 07/20/2015  . Other allergic rhinitis 07/20/2015  . Mixed hyperlipidemia 03/01/2012  . Migraine without aura 04/05/2010  . FACIAL PARESTHESIA, LEFT 10/16/2009  . Vitamin D deficiency 03/19/2009  . DEGENERATIVE JOINT DISEASE 12/05/2007  . NOCTURIA 12/05/2007  . Hypothyroidism 01/22/2007  . IBS 09/28/2006  . Osteopenia 09/28/2006    Current Outpatient Medications on File Prior to Visit  Medication Sig Dispense Refill  . albuterol (PROAIR HFA) 108 (90 Base) MCG/ACT inhaler Inhale 2 puffs into the lungs every 4 (four) hours as needed for wheezing. Or coughing spells. (Patient taking differently: Inhale 2 puffs into the lungs every 4 (four) hours as needed for wheezing (using  1 puff daily prn). Or coughing spells.) 1 Inhaler 1  . Ascorbic Acid (VITAMIN C) 1000 MG tablet Take 1,000 mg by mouth daily.      Marland Kitchen aspirin 81 MG tablet Take 81 mg by mouth daily.    Marland Kitchen azelastine (ASTELIN) 0.1 % nasal spray USE 1 SPRAY IN EACH NOSTRIL TWICE A DAY AS DIRECTED 90 mL 0  . Cholecalciferol (VITAMIN D3) 1000 UNITS CAPS Take by mouth daily.      . fluticasone (FLONASE) 50 MCG/ACT nasal spray USE 2 SPRAYS IN EACH NOSTRIL EVERY DAY FOR STUFFY NOSE OR DRAINAGE 16 g 3  . levothyroxine (SYNTHROID, LEVOTHROID) 112 MCG tablet TAKE 1 TABLET BY MOUTH EVERY DAY BEFORE BREAKFAST 90 tablet 1  . Multiple Vitamin (MULTIVITAMIN PO) Take by mouth daily.      . pravastatin (PRAVACHOL) 20 MG tablet TAKE 1 TABLET BY MOUTH EVERY DAY 90 tablet 3   No current facility-administered medications on file prior to visit.     Past Medical History:  Diagnosis Date  . Allergy    SEASONAL  . Fibroid    no surgery  . Heart murmur   . Hyperlipidemia   . Hypothyroidism   . Migraines   . Mild intermittent asthma 07/20/2015  . Osteopenia   . Sciatic nerve lesion 2018  . Vitamin D deficiency     Past Surgical History:  Procedure Laterality Date  . COLONOSCOPY  2014   negative , Dr Ardis Hughs  . cortisone injections     in left hand  . LUMBAR FUSION     Dr Hal Neer  . POLYPECTOMY     uterine  . SEPTOPLASTY    . SINOSCOPY    . TUBAL LIGATION      Social History   Socioeconomic History  . Marital status: Widowed    Spouse name: None  . Number of children: 3  . Years of education: None  . Highest education level: None  Social Needs  . Financial resource strain: None  . Food insecurity - worry: None  . Food insecurity - inability: None  . Transportation needs - medical: None  . Transportation needs - non-medical: None  Occupational History  . Occupation: retired  Tobacco Use  . Smoking status: Former Smoker    Packs/day: 0.30    Years: 40.00    Pack years: 12.00    Types: Cigarettes     Last attempt to quit: 07/04/1974    Years since quitting: 42.9  . Smokeless tobacco: Never Used  . Tobacco comment: smoked 1965-1976 , < 3 cig/ day  Substance and Sexual Activity  . Alcohol use: No    Alcohol/week: 0.0 oz    Comment: rarely  . Drug use: No  . Sexual activity: No    Partners: Male    Birth control/protection:  Surgical, Post-menopausal    Comment: BTL  Other Topics Concern  . None  Social History Narrative   Exercise: walking, stretching, weights    Family History  Problem Relation Age of Onset  . Hypertension Mother   . Heart disease Mother        CHF; AS  . Osteoporosis Mother   . Heart attack Mother 18  . Allergic rhinitis Mother   . Diabetes Father   . Stroke Father        onset in 90s  . Cancer Brother        malignant brain tumor  . Colon cancer Neg Hx   . Esophageal cancer Neg Hx   . Rectal cancer Neg Hx   . Stomach cancer Neg Hx   . Angioedema Neg Hx   . Asthma Neg Hx   . Eczema Neg Hx   . Immunodeficiency Neg Hx   . Urticaria Neg Hx     Review of Systems  Constitutional: Positive for diaphoresis (nigh sweats since being off HRT). Negative for fever.  HENT: Positive for hearing loss. Negative for tinnitus.   Eyes: Negative for visual disturbance.  Respiratory: Negative for cough, shortness of breath and wheezing.   Cardiovascular: Positive for palpitations (with hot flashes). Negative for chest pain and leg swelling.  Gastrointestinal: Positive for constipation. Negative for blood in stool and nausea.  Genitourinary: Negative for dysuria and hematuria.  Musculoskeletal: Positive for arthralgias.  Skin: Negative for color change and rash.  Neurological: Positive for dizziness (rare) and headaches (occ). Negative for light-headedness.  Psychiatric/Behavioral: Negative for dysphoric mood. The patient is not nervous/anxious.        Objective:   Vitals:   06/22/17 1058  BP: 132/86  Pulse: 70  Resp: 16  Temp: 98 F (36.7 C)  SpO2: 98%     Filed Weights   06/22/17 1058  Weight: 143 lb (64.9 kg)   Body mass index is 25.33 kg/m.  Wt Readings from Last 3 Encounters:  06/22/17 143 lb (64.9 kg)  03/03/17 141 lb (64 kg)  02/07/17 142 lb (64.4 kg)     Physical Exam Constitutional: She appears well-developed and well-nourished. No distress.  HENT:  Head: Normocephalic and atraumatic.  Right Ear: External ear normal. Normal ear canal and TM Left Ear: External ear normal.  Normal ear canal and TM Mouth/Throat: Oropharynx is clear and moist.  Eyes: Conjunctivae and EOM are normal.  Neck: Neck supple. No tracheal deviation present. No thyromegaly present.  No carotid bruit  Cardiovascular: Normal rate, regular rhythm and normal heart sounds.   No murmur heard.  No edema. Pulmonary/Chest: Effort normal and breath sounds normal. No respiratory distress. She has no wheezes. She has no rales.  Breast: deferred to Gyn Abdominal: Soft. She exhibits no distension. There is no tenderness.  Lymphadenopathy: She has no cervical adenopathy.  Skin: Skin is warm and dry. She is not diaphoretic.  Psychiatric: She has a normal mood and affect. Her behavior is normal.        Assessment & Plan:   Wellness Exam: Immunizations  Discussed shingrix, others up to date Colonoscopy   Up to date  Mammogram   Up to date  Gyn   Up to date  Dexa  Last done 2017 - Up to date  Eye exams   Up to date  Hearing loss  Yes - wears hearing aids Memory concerns/difficulties   none Independent of ADLs   Fully independent Stressed the importance of regular  exercise   Patient received copy of preventative screening tests/immunizations recommended for the next 5-10 years.   Physical exam: Screening blood work      ordered Immunizations  Discussed shingrix, others up to date Colonoscopy   Up to date  Mammogram   Up to date  Gyn   Up to date  Dexa  Last done 2017 - Up to date  Eye exams   Up to date  EKG   Last done 2014 Exercise    Regular  but not as much as she should - class once a week - yoga stretch, walks several times a week Weight    Normal BMI Skin  No concerns - sews derm once a year Substance abuse    none  See Problem List for Assessment and Plan of chronic medical problems.

## 2017-06-22 NOTE — Assessment & Plan Note (Signed)
Check tsh  Titrate med dose if needed  

## 2017-06-22 NOTE — Assessment & Plan Note (Signed)
Check lipid panel  Continue daily statin Regular exercise and healthy diet encouraged  

## 2017-06-22 NOTE — Patient Instructions (Addendum)
Pamela Rich , Thank you for taking time to come for your Medicare Wellness Visit. I appreciate your ongoing commitment to your health goals. Please review the following plan we discussed and let me know if I can assist you in the future.   These are the goals we discussed: Goals    . Continue regular exercise       This is a list of the screening recommended for you and due dates:  Health Maintenance  Topic Date Due  . DEXA scan (bone density measurement)  11/05/2018  . Mammogram  01/27/2019  . Tetanus Vaccine  07/26/2022  . Colon Cancer Screening  12/26/2022  . Flu Shot  Completed  .  Hepatitis C: One time screening is recommended by Center for Disease Control  (CDC) for  adults born from [birth year] through 1965.   Completed  . Pneumonia vaccines  Completed      Test(s) ordered today. Your results will be released to [facility] (or called to you) after review, usually within 72hours after test completion. If any changes need to be made, you will be notified at that same time.  All other Health Maintenance issues reviewed.   All recommended immunizations and age-appropriate screenings are up-to-date or discussed.  No immunizations administered today.   Medications reviewed and updated.  No changes recommended at this time.   Please followup in one year   Health Maintenance, Female Adopting a healthy lifestyle and getting preventive care can go a long way to promote health and wellness. Talk with your health care provider about what schedule of regular examinations is right for you. This is a good chance for you to check in with your provider about disease prevention and staying healthy. In between checkups, there are plenty of things you can do on your own. Experts have done a lot of research about which lifestyle changes and preventive measures are most likely to keep you healthy. Ask your health care provider for more information. Weight and diet Eat a healthy diet  Be sure to  include plenty of vegetables, fruits, low-fat dairy products, and lean protein.  Do not eat a lot of foods high in solid fats, added sugars, or salt.  Get regular exercise. This is one of the most important things you can do for your health. ? Most adults should exercise for at least 150 minutes each week. The exercise should increase your heart rate and make you sweat (moderate-intensity exercise). ? Most adults should also do strengthening exercises at least twice a week. This is in addition to the moderate-intensity exercise.  Maintain a healthy weight  Body mass index (BMI) is a measurement that can be used to identify possible weight problems. It estimates body fat based on height and weight. Your health care provider can help determine your BMI and help you achieve or maintain a healthy weight.  For females 54 years of age and older: ? A BMI below 18.5 is considered underweight. ? A BMI of 18.5 to 24.9 is normal. ? A BMI of 25 to 29.9 is considered overweight. ? A BMI of 30 and above is considered obese.  Watch levels of cholesterol and blood lipids  You should start having your blood tested for lipids and cholesterol at 71 years of age, then have this test every 5 years.  You may need to have your cholesterol levels checked more often if: ? Your lipid or cholesterol levels are high. ? You are older than 71 years of age. ?  You are at high risk for heart disease.  Cancer screening Lung Cancer  Lung cancer screening is recommended for adults 74-67 years old who are at high risk for lung cancer because of a history of smoking.  A yearly low-dose CT scan of the lungs is recommended for people who: ? Currently smoke. ? Have quit within the past 15 years. ? Have at least a 30-pack-year history of smoking. A pack year is smoking an average of one pack of cigarettes a day for 1 year.  Yearly screening should continue until it has been 15 years since you quit.  Yearly screening  should stop if you develop a health problem that would prevent you from having lung cancer treatment.  Breast Cancer  Practice breast self-awareness. This means understanding how your breasts normally appear and feel.  It also means doing regular breast self-exams. Let your health care provider know about any changes, no matter how small.  If you are in your 20s or 30s, you should have a clinical breast exam (CBE) by a health care provider every 1-3 years as part of a regular health exam.  If you are 40 or older, have a CBE every year. Also consider having a breast X-ray (mammogram) every year.  If you have a family history of breast cancer, talk to your health care provider about genetic screening.  If you are at high risk for breast cancer, talk to your health care provider about having an MRI and a mammogram every year.  Breast cancer gene (BRCA) assessment is recommended for women who have family members with BRCA-related cancers. BRCA-related cancers include: ? Breast. ? Ovarian. ? Tubal. ? Peritoneal cancers.  Results of the assessment will determine the need for genetic counseling and BRCA1 and BRCA2 testing.  Cervical Cancer Your health care provider may recommend that you be screened regularly for cancer of the pelvic organs (ovaries, uterus, and vagina). This screening involves a pelvic examination, including checking for microscopic changes to the surface of your cervix (Pap test). You may be encouraged to have this screening done every 3 years, beginning at age 48.  For women ages 83-65, health care providers may recommend pelvic exams and Pap testing every 3 years, or they may recommend the Pap and pelvic exam, combined with testing for human papilloma virus (HPV), every 5 years. Some types of HPV increase your risk of cervical cancer. Testing for HPV may also be done on women of any age with unclear Pap test results.  Other health care providers may not recommend any  screening for nonpregnant women who are considered low risk for pelvic cancer and who do not have symptoms. Ask your health care provider if a screening pelvic exam is right for you.  If you have had past treatment for cervical cancer or a condition that could lead to cancer, you need Pap tests and screening for cancer for at least 20 years after your treatment. If Pap tests have been discontinued, your risk factors (such as having a new sexual partner) need to be reassessed to determine if screening should resume. Some women have medical problems that increase the chance of getting cervical cancer. In these cases, your health care provider may recommend more frequent screening and Pap tests.  Colorectal Cancer  This type of cancer can be detected and often prevented.  Routine colorectal cancer screening usually begins at 71 years of age and continues through 71 years of age.  Your health care provider may recommend screening  at an earlier age if you have risk factors for colon cancer.  Your health care provider may also recommend using home test kits to check for hidden blood in the stool.  A small camera at the end of a tube can be used to examine your colon directly (sigmoidoscopy or colonoscopy). This is done to check for the earliest forms of colorectal cancer.  Routine screening usually begins at age 66.  Direct examination of the colon should be repeated every 5-10 years through 71 years of age. However, you may need to be screened more often if early forms of precancerous polyps or small growths are found.  Skin Cancer  Check your skin from head to toe regularly.  Tell your health care provider about any new moles or changes in moles, especially if there is a change in a mole's shape or color.  Also tell your health care provider if you have a mole that is larger than the size of a pencil eraser.  Always use sunscreen. Apply sunscreen liberally and repeatedly throughout the  day.  Protect yourself by wearing long sleeves, pants, a wide-brimmed hat, and sunglasses whenever you are outside.  Heart disease, diabetes, and high blood pressure  High blood pressure causes heart disease and increases the risk of stroke. High blood pressure is more likely to develop in: ? People who have blood pressure in the high end of the normal range (130-139/85-89 mm Hg). ? People who are overweight or obese. ? People who are African American.  If you are 41-25 years of age, have your blood pressure checked every 3-5 years. If you are 56 years of age or older, have your blood pressure checked every year. You should have your blood pressure measured twice-once when you are at a hospital or clinic, and once when you are not at a hospital or clinic. Record the average of the two measurements. To check your blood pressure when you are not at a hospital or clinic, you can use: ? An automated blood pressure machine at a pharmacy. ? A home blood pressure monitor.  If you are between 62 years and 4 years old, ask your health care provider if you should take aspirin to prevent strokes.  Have regular diabetes screenings. This involves taking a blood sample to check your fasting blood sugar level. ? If you are at a normal weight and have a low risk for diabetes, have this test once every three years after 71 years of age. ? If you are overweight and have a high risk for diabetes, consider being tested at a younger age or more often. Preventing infection Hepatitis B  If you have a higher risk for hepatitis B, you should be screened for this virus. You are considered at high risk for hepatitis B if: ? You were born in a country where hepatitis B is common. Ask your health care provider which countries are considered high risk. ? Your parents were born in a high-risk country, and you have not been immunized against hepatitis B (hepatitis B vaccine). ? You have HIV or AIDS. ? You use needles to  inject street drugs. ? You live with someone who has hepatitis B. ? You have had sex with someone who has hepatitis B. ? You get hemodialysis treatment. ? You take certain medicines for conditions, including cancer, organ transplantation, and autoimmune conditions.  Hepatitis C  Blood testing is recommended for: ? Everyone born from [birth year] through 1965. ? Anyone with known risk factors  for hepatitis C.  Sexually transmitted infections (STIs)  You should be screened for sexually transmitted infections (STIs) including gonorrhea and chlamydia if: ? You are sexually active and are younger than 71 years of age. ? You are older than 71 years of age and your health care provider tells you that you are at risk for this type of infection. ? Your sexual activity has changed since you were last screened and you are at an increased risk for chlamydia or gonorrhea. Ask your health care provider if you are at risk.  If you do not have HIV, but are at risk, it may be recommended that you take a prescription medicine daily to prevent HIV infection. This is called pre-exposure prophylaxis (PrEP). You are considered at risk if: ? You are sexually active and do not regularly use condoms or know the HIV status of your partner(s). ? You take drugs by injection. ? You are sexually active with a partner who has HIV.  Talk with your health care provider about whether you are at high risk of being infected with HIV. If you choose to begin PrEP, you should first be tested for HIV. You should then be tested every 3 months for as long as you are taking PrEP. Pregnancy  If you are premenopausal and you may become pregnant, ask your health care provider about preconception counseling.  If you may become pregnant, take 400 to 800 micrograms (mcg) of folic acid every day.  If you want to prevent pregnancy, talk to your health care provider about birth control (contraception). Osteoporosis and  menopause  Osteoporosis is a disease in which the bones lose minerals and strength with aging. This can result in serious bone fractures. Your risk for osteoporosis can be identified using a bone density scan.  If you are 76 years of age or older, or if you are at risk for osteoporosis and fractures, ask your health care provider if you should be screened.  Ask your health care provider whether you should take a calcium or vitamin D supplement to lower your risk for osteoporosis.  Menopause may have certain physical symptoms and risks.  Hormone replacement therapy may reduce some of these symptoms and risks. Talk to your health care provider about whether hormone replacement therapy is right for you. Follow these instructions at home:  Schedule regular health, dental, and eye exams.  Stay current with your immunizations.  Do not use any tobacco products including cigarettes, chewing tobacco, or electronic cigarettes.  If you are pregnant, do not drink alcohol.  If you are breastfeeding, limit how much and how often you drink alcohol.  Limit alcohol intake to no more than 1 drink per day for nonpregnant women. One drink equals 12 ounces of beer, 5 ounces of wine, or 1 ounces of hard liquor.  Do not use street drugs.  Do not share needles.  Ask your health care provider for help if you need support or information about quitting drugs.  Tell your health care provider if you often feel depressed.  Tell your health care provider if you have ever been abused or do not feel safe at home. This information is not intended to replace advice given to you by your health care provider. Make sure you discuss any questions you have with your health care provider. Document Released: 01/03/2011 Document Revised: 11/26/2015 Document Reviewed: 03/24/2015 Elsevier Interactive Patient Education  [employer].

## 2017-06-24 ENCOUNTER — Encounter: Payer: Self-pay | Admitting: Internal Medicine

## 2017-06-25 ENCOUNTER — Other Ambulatory Visit: Payer: Self-pay | Admitting: Internal Medicine

## 2017-07-03 ENCOUNTER — Telehealth: Payer: Self-pay | Admitting: Internal Medicine

## 2017-07-03 NOTE — Telephone Encounter (Signed)
Copied from [place] [id number]. Topic: Quick Communication - See Telephone Encounter >> Jul 03, 2017 10:57 AM Corie Chiquito, NT wrote: CRM for notification. See Telephone encounter for: Patient called and stated that the pharmacy needs an  order to change the manufacturer for her Levothroxine so she will be able to have the medication refilled. If someone could give her a call back a [phone]. Patient also stated that [facility] has been trying to contact the office as well but hasn't gotten an answer.  07/03/17.

## 2017-07-03 NOTE — Telephone Encounter (Signed)
Patient states she needs her thyroid medication addressed. She will be out on Saturday and the office needs to contact [facility] to OK a change so she can continue her therapy. Please call [facility].

## 2017-07-05 MED ORDER — LEVOTHYROXINE SODIUM 50 MCG PO TABS: 100.0000 ug | ORAL_TABLET | Freq: Every day | ORAL | 3 refills | Status: DC

## 2017-07-05 NOTE — Telephone Encounter (Signed)
We can change her dose based on her last blood work results -- levo 50 mcg daily - take two tablets for 100 mcg daily.    New rx sent

## 2017-07-05 NOTE — Telephone Encounter (Signed)
Called [facility] spoke w/pharmacist Verdis Frederickson she states majority of the levothyroxine dosages are on back order w/no release date. The only mcg they have are the 50 mcg, 75 mcg, 137 mcg and 150 mcg. Need ing order to change to what is available pt supposed to be taking the 112 mcg...Pamela Rich

## 2017-07-06 ENCOUNTER — Telehealth: Payer: Self-pay | Admitting: Internal Medicine

## 2017-07-06 DIAGNOSIS — E039 Hypothyroidism, unspecified: Secondary | ICD-10-CM

## 2017-07-06 NOTE — Telephone Encounter (Signed)
Called pt back she wanted to know did MD mean for her to take just 100 mcg of the levothyroxine. She use to take 112 mcg. Inform pt of MD response from msg yesterday regarding some msg are on back order, and due to her results on her lab MD states for her to do 50 mcg 2 a day. (see previous msg). Pt states should she come back in 6 months to have her TSH levels check since she is taking a lower dosage...Pamela Rich

## 2017-07-06 NOTE — Telephone Encounter (Signed)
Copied from [place]. Topic: Quick Communication - See Telephone Encounter >> Jul 06, 2017 10:09 AM Aurelio Brash B wrote: CRM for notification. See Telephone encounter for:  Pt has questions on dosage .levothyroxine (SYNTHROID, LEVOTHROID) 50 MCG tablet

## 2017-07-06 NOTE — Telephone Encounter (Signed)
LVM informing pt of MDs response.  

## 2017-07-06 NOTE — Telephone Encounter (Signed)
Yes ok to decrease  to 100 mcg due to her last tsh.  Recheck tsh in about 3 months.  ( I ordered this)

## 2017-10-04 ENCOUNTER — Other Ambulatory Visit: Payer: Self-pay | Admitting: Allergy and Immunology

## 2017-10-18 ENCOUNTER — Telehealth: Payer: Self-pay | Admitting: Emergency Medicine

## 2017-10-18 DIAGNOSIS — Z0110 Encounter for examination of ears and hearing without abnormal findings: Secondary | ICD-10-CM

## 2017-10-18 NOTE — Telephone Encounter (Signed)
Copied from [place] [id number]. Topic: Referral - Request >> Oct 18, 2017  8:43 AM Vernona Rieger wrote: Reason for CRM: Patient said that she is currently at Dr Mayer Masker office @ [place]. She said that she needs a referral faxed to them for insurance purposes with medicare. She is having an audio logical evaluation. There fax number is [phone]

## 2017-10-18 NOTE — Telephone Encounter (Signed)
Referral entered  

## 2017-11-15 ENCOUNTER — Other Ambulatory Visit: Payer: Self-pay | Admitting: Allergy and Immunology

## 2017-11-15 NOTE — Telephone Encounter (Signed)
Courtesy refill  

## 2017-11-20 ENCOUNTER — Ambulatory Visit: Payer: Medicare Other | Admitting: Allergy and Immunology

## 2017-11-20 ENCOUNTER — Encounter: Payer: Self-pay | Admitting: Allergy and Immunology

## 2017-11-20 VITALS — BP 118/76 | HR 78 | Resp 16

## 2017-11-20 DIAGNOSIS — J4530 Mild persistent asthma, uncomplicated: Secondary | ICD-10-CM | POA: Diagnosis not present

## 2017-11-20 DIAGNOSIS — J3089 Other allergic rhinitis: Secondary | ICD-10-CM

## 2017-11-20 MED ORDER — FLUTICASONE PROPIONATE 50 MCG/ACT NA SUSP: NASAL | 11 refills | Status: DC

## 2017-11-20 MED ORDER — FLUTICASONE PROPIONATE HFA 44 MCG/ACT IN AERO: 2.0000 | INHALATION_SPRAY | Freq: Two times a day (BID) | RESPIRATORY_TRACT | 11 refills | Status: DC

## 2017-11-20 MED ORDER — AZELASTINE HCL 0.1 % NA SOLN: NASAL | 0 refills | Status: DC

## 2017-11-20 NOTE — Patient Instructions (Signed)
Mild persistent asthma Well-controlled.  For now, continue Flovent 44 g, 2 inhalations via spacer device twice a day, and albuterol HFA, 1-2 inhalations every 4-6 hours as needed.  If subjective and objective measures of pulmonary function remain stable, we will consider stepping down therapy on the next visit.  Other allergic rhinitis Stable.  Continue appropriate allergen avoidance measures, nasal saline irrigation, azelastine nasal spray, and fluticasone nasal spray as needed.   Return in about 6-12 months, or if symptoms worsen or fail to improve.

## 2017-11-20 NOTE — Assessment & Plan Note (Signed)
Well-controlled.  For now, continue Flovent 44 g, 2 inhalations via spacer device twice a day, and albuterol HFA, 1-2 inhalations every 4-6 hours as needed.  If subjective and objective measures of pulmonary function remain stable, we will consider stepping down therapy on the next visit.

## 2017-11-20 NOTE — Progress Notes (Signed)
Follow-up Note  RE: Pamela Rich MRN: [id number] DOB: Apr 21, 1946 Date of Office Visit: 11/20/2017  Primary care provider: Binnie Rail, MD Referring provider: Binnie Rail, MD  History of present illness: Pamela Rich is a 72 y.o. female with asthma and allergic rhinitis presenting today for follow-up.  In the interval since her previous visit her upper and lower respiratory symptoms have been well controlled.  While taking Flovent 44 g, 2 inhalations via spacer twice twice daily, she rarely requires albuterol rescue and does not experience nocturnal awakenings due to lower respiratory symptoms.  She requests refill prescriptions for azelastine nasal spray and fluticasone nasal spray.  She is using saline, azelastine, and fluticasone twice daily and reports that having started this regimen a year ago she has not developed any sinus infections.  Assessment and plan: Mild persistent asthma Well-controlled.  For now, continue Flovent 44 g, 2 inhalations via spacer device twice a day, and albuterol HFA, 1-2 inhalations every 4-6 hours as needed.  If subjective and objective measures of pulmonary function remain stable, we will consider stepping down therapy on the next visit.  Other allergic rhinitis Stable.  Continue appropriate allergen avoidance measures, nasal saline irrigation, azelastine nasal spray, and fluticasone nasal spray as needed.   Meds ordered this encounter  Medications  . fluticasone (FLONASE) 50 MCG/ACT nasal spray    Sig: USE 2 SPRAYS IN EACH NOSTRIL EVERY DAY FOR STUFFY NOSE OR DRAINAGE    Dispense:  16 g    Refill:  11  . azelastine (ASTELIN) 0.1 % nasal spray    Sig: Use in each nostril as directed    Dispense:  30 mL    Refill:  0    Patient must have office visit for further refills.  . fluticasone (FLOVENT HFA) 44 MCG/ACT inhaler    Sig: Inhale 2 puffs into the lungs 2 (two) times daily.    Dispense:  10.6 g    Refill:  11     Diagnostics: Spirometry reveals an FVC of 2.06 L (77%) and an FEV1 of 1.66 L (80% predicted) with an FEV1 ratio of 107%.  Mild restrictive pattern and FEV1 is consistent with previous study.    Physical examination: Blood pressure 118/76, pulse 78, resp. rate 16, last menstrual period 07/04/2000, SpO2 95 %.  General: Alert, interactive, in no acute distress. HEENT: TMs pearly gray, turbinates mildly edematous without discharge, post-pharynx unremarkable. Neck: Supple without lymphadenopathy. Lungs: Clear to auscultation without wheezing, rhonchi or rales. CV: Normal S1, S2 without murmurs. Skin: Warm and dry, without lesions or rashes.  The following portions of the patient's history were reviewed and updated as appropriate: allergies, current medications, past family history, past medical history, past social history, past surgical history and problem list.  Allergies as of 11/20/2017      Reactions   Pneumococcal Vaccines    Area weakness, redness, and swelling       Medication List        Accurate as of 11/20/17  1:36 PM. Always use your most recent med list.          albuterol 108 (90 Base) MCG/ACT inhaler Commonly known as:  PROAIR HFA Inhale 2 puffs into the lungs every 4 (four) hours as needed for wheezing. Or coughing spells.   aspirin 81 MG tablet Take 81 mg by mouth daily.   azelastine 0.1 % nasal spray Commonly known as:  ASTELIN Use in each nostril as directed   fluticasone  44 MCG/ACT inhaler Commonly known as:  FLOVENT HFA Inhale 2 puffs into the lungs 2 (two) times daily.   fluticasone 50 MCG/ACT nasal spray Commonly known as:  FLONASE USE 2 SPRAYS IN EACH NOSTRIL EVERY DAY FOR STUFFY NOSE OR DRAINAGE   folic acid 1 MG tablet Commonly known as:  FOLVITE Take 1 mg by mouth daily.   levothyroxine 50 MCG tablet Commonly known as:  SYNTHROID, LEVOTHROID Take 2 tablets (100 mcg total) by mouth daily.   MULTIVITAMIN PO Take by mouth daily.    pravastatin 20 MG tablet Commonly known as:  PRAVACHOL TAKE 1 TABLET BY MOUTH EVERY DAY   vitamin C 1000 MG tablet Take 1,000 mg by mouth daily.   Vitamin D3 1000 units Caps Take by mouth daily.       Allergies  Allergen Reactions  . Pneumococcal Vaccines     Area weakness, redness, and swelling     I appreciate the opportunity to take part in [facility] care. Please do not hesitate to contact me with questions.  Sincerely,   R. Edgar Frisk, MD

## 2017-11-20 NOTE — Assessment & Plan Note (Signed)
Stable.  Continue appropriate allergen avoidance measures, nasal saline irrigation, azelastine nasal spray, and fluticasone nasal spray as needed.

## 2017-12-07 ENCOUNTER — Other Ambulatory Visit: Payer: Self-pay | Admitting: Allergy and Immunology

## 2017-12-25 ENCOUNTER — Other Ambulatory Visit: Payer: Self-pay | Admitting: Internal Medicine

## 2017-12-25 DIAGNOSIS — Z1231 Encounter for screening mammogram for malignant neoplasm of breast: Secondary | ICD-10-CM

## 2018-01-29 ENCOUNTER — Ambulatory Visit
Admission: RE | Admit: 2018-01-29 | Discharge: 2018-01-29 | Disposition: A | Discharge status: Home / Self Care | Payer: Medicare Other | Source: Ambulatory Visit | Attending: Internal Medicine | Admitting: Internal Medicine

## 2018-01-29 DIAGNOSIS — Z1231 Encounter for screening mammogram for malignant neoplasm of breast: Secondary | ICD-10-CM

## 2018-01-30 ENCOUNTER — Other Ambulatory Visit: Payer: Self-pay

## 2018-01-30 ENCOUNTER — Encounter: Payer: Self-pay | Admitting: Obstetrics & Gynecology

## 2018-01-30 ENCOUNTER — Other Ambulatory Visit (HOSPITAL_COMMUNITY)
Admission: RE | Admit: 2018-01-30 | Discharge: 2018-01-30 | Disposition: A | Discharge status: Home / Self Care | Payer: Medicare Other | Source: Ambulatory Visit | Attending: Obstetrics & Gynecology | Admitting: Obstetrics & Gynecology

## 2018-01-30 ENCOUNTER — Ambulatory Visit (INDEPENDENT_AMBULATORY_CARE_PROVIDER_SITE_OTHER): Payer: Medicare Other | Admitting: Obstetrics & Gynecology

## 2018-01-30 VITALS — BP 110/70 | HR 76 | Resp 16 | Ht 63.0 in | Wt 146.4 lb

## 2018-01-30 DIAGNOSIS — Z01419 Encounter for gynecological examination (general) (routine) without abnormal findings: Secondary | ICD-10-CM | POA: Diagnosis not present

## 2018-01-30 DIAGNOSIS — Z124 Encounter for screening for malignant neoplasm of cervix: Secondary | ICD-10-CM

## 2018-01-30 NOTE — Progress Notes (Signed)
72 y.o. G4P3 WidowedCaucasianF here for annual exam.  Doing well.  Still having hot flashes.  Head will be totally wet with sweat when she has a hot flash.  The winter was better but this summer has been worse due to the heat.     Denies vaginal bleeding.    Having some wrist issues.  Has seen Dr. Fredna Dow.  Having MRI scheduled for the chronic pain in her wrist.    Patient's last menstrual period was 07/04/2000.          Sexually active: No.  The current method of family planning is post menopausal status.    Exercising: Yes.    walking Smoker:  no  Health Maintenance: Pap:  07/28/15 neg   04/02/13 Neg  History of abnormal Pap:  no MMG:  01/29/18 BIRADS1:Neg  Colonoscopy:  12/25/12 normal. F/u 10 years  BMD:   11/05/15 Osteopenia, -1.3 worse measurment TDaP:  07/26/12 Pneumonia vaccine(s):  completed Shingrix:   D/w pt today.  Has done Zostavax 09/24/07 Hep C testing: 06/04/15 Neg  Screening Labs: PCP   reports that she quit smoking about 43 years ago. Her smoking use included cigarettes. She has a 12.00 pack-year smoking history. She has never used smokeless tobacco. She reports that she does not drink alcohol or use drugs.  Past Medical History:  Diagnosis Date  . Allergy    SEASONAL  . Fibroid    no surgery  . Heart murmur   . Hyperlipidemia   . Hypothyroidism   . Migraines   . Mild intermittent asthma 07/20/2015  . Osteopenia   . Sciatic nerve lesion 2018  . Vitamin D deficiency     Past Surgical History:  Procedure Laterality Date  . COLONOSCOPY  2014   negative , Dr Ardis Hughs  . cortisone injections     in left hand  . LUMBAR FUSION     Dr Hal Neer  . POLYPECTOMY     uterine  . SEPTOPLASTY    . SINOSCOPY    . TUBAL LIGATION      Current Outpatient Medications  Medication Sig Dispense Refill  . albuterol (PROAIR HFA) 108 (90 Base) MCG/ACT inhaler Inhale 2 puffs into the lungs every 4 (four) hours as needed for wheezing. Or coughing spells. (Patient taking differently:  Inhale 2 puffs into the lungs every 4 (four) hours as needed for wheezing (using 1 puff daily prn). Or coughing spells.) 1 Inhaler 1  . Ascorbic Acid (VITAMIN C) 1000 MG tablet Take 1,000 mg by mouth daily.      Marland Kitchen aspirin 81 MG tablet Take 81 mg by mouth daily.    Marland Kitchen azelastine (ASTELIN) 0.1 % nasal spray Use in each nostril as directed 30 mL 0  . Cholecalciferol (VITAMIN D3) 1000 UNITS CAPS Take by mouth daily.      . fluticasone (FLONASE) 50 MCG/ACT nasal spray USE 2 SPRAYS IN EACH NOSTRIL EVERY DAY FOR STUFFY NOSE OR DRAINAGE 16 g 11  . fluticasone (FLOVENT HFA) 44 MCG/ACT inhaler Inhale 2 puffs into the lungs 2 (two) times daily. 27.7 g 11  . folic acid (FOLVITE) 1 MG tablet Take 1 mg by mouth daily.    Marland Kitchen levothyroxine (SYNTHROID, LEVOTHROID) 50 MCG tablet Take 2 tablets (100 mcg total) by mouth daily. 180 tablet 3  . Multiple Vitamin (MULTIVITAMIN PO) Take by mouth daily.      Marland Kitchen neomycin-polymyxin-hydrocortisone (CORTISPORIN) 3.5-10000-1 OTIC suspension daily as needed.  0  . pravastatin (PRAVACHOL) 20 MG tablet TAKE  1 TABLET BY MOUTH EVERY DAY 90 tablet 3   No current facility-administered medications for this visit.     Family History  Problem Relation Age of Onset  . Hypertension Mother   . Heart disease Mother        CHF; AS  . Osteoporosis Mother   . Heart attack Mother 6  . Allergic rhinitis Mother   . Diabetes Father   . Stroke Father        onset in 58s  . Cancer Brother        malignant brain tumor  . Colon cancer Neg Hx   . Esophageal cancer Neg Hx   . Rectal cancer Neg Hx   . Stomach cancer Neg Hx   . Angioedema Neg Hx   . Asthma Neg Hx   . Eczema Neg Hx   . Immunodeficiency Neg Hx   . Urticaria Neg Hx     Review of Systems  HENT: Positive for hearing loss.   Gastrointestinal: Positive for constipation.  Genitourinary:       Night urination   Musculoskeletal: Positive for myalgias.  Neurological: Positive for headaches.  All other systems reviewed and  are negative.   Exam:   BP 110/70 (BP Location: Left Arm, Patient Position: Sitting, Cuff Size: Large)   Pulse 76   Resp 16   Ht 5\' 3"  (1.6 m)   Wt 146 lb 6.4 oz (66.4 kg)   LMP 07/04/2000   BMI 25.93 kg/m    Height: 5\' 3"  (160 cm)  Ht Readings from Last 3 Encounters:  01/30/18 5\' 3"  (1.6 m)  06/22/17 5\' 3"  (1.6 m)  03/03/17 5\' 3"  (1.6 m)    General appearance: alert, cooperative and appears stated age Head: Normocephalic, without obvious abnormality, atraumatic Neck: no adenopathy, supple, symmetrical, trachea midline and thyroid normal to inspection and palpation Lungs: clear to auscultation bilaterally Breasts: normal appearance, no masses or tenderness Heart: regular rate and rhythm Abdomen: soft, non-tender; bowel sounds normal; no masses,  no organomegaly Extremities: extremities normal, atraumatic, no cyanosis or edema Skin: Skin color, texture, turgor normal. No rashes or lesions Lymph nodes: Cervical, supraclavicular, and axillary nodes normal. No abnormal inguinal nodes palpated Neurologic: Grossly normal   Pelvic: External genitalia:  no lesions              Urethra:  normal appearing urethra with no masses, tenderness or lesions              Bartholins and Skenes: normal                 Vagina: normal appearing vagina with normal color and discharge, no lesions              Cervix: no lesions              Pap taken: Yes.   Bimanual Exam:  Uterus:  normal size, contour, position, consistency, mobility, non-tender              Adnexa: normal adnexa and no mass, fullness, tenderness               Rectovaginal: Confirms               Anus:  normal sphincter tone, no lesions  Chaperone was present for exam.  A:  Well Woman with normal exam PMP, no HRT Vasomotor symptoms  Hypothyroidism Hyperlipidemia Mild osteopenia Right wrist   P:   Mammogram guidelines reviewed pap smear obtained today Colonoscopy,  BMD UTD D/w Shingrix vaccination Blood work is  UTD Return annually or prn

## 2018-01-31 LAB — CYTOLOGY - PAP: DIAGNOSIS: NEGATIVE

## 2018-02-05 ENCOUNTER — Other Ambulatory Visit: Payer: Self-pay | Admitting: Orthopedic Surgery

## 2018-02-05 DIAGNOSIS — M1811 Unilateral primary osteoarthritis of first carpometacarpal joint, right hand: Secondary | ICD-10-CM

## 2018-02-21 ENCOUNTER — Ambulatory Visit
Admission: RE | Admit: 2018-02-21 | Discharge: 2018-02-21 | Disposition: A | Discharge status: Home / Self Care | Payer: Medicare Other | Source: Ambulatory Visit | Attending: Orthopedic Surgery | Admitting: Orthopedic Surgery

## 2018-02-21 DIAGNOSIS — M1811 Unilateral primary osteoarthritis of first carpometacarpal joint, right hand: Secondary | ICD-10-CM

## 2018-03-07 ENCOUNTER — Ambulatory Visit
Admission: RE | Admit: 2018-03-07 | Discharge: 2018-03-07 | Disposition: A | Discharge status: Home / Self Care | Payer: Medicare Other | Source: Ambulatory Visit | Attending: Orthopedic Surgery | Admitting: Orthopedic Surgery

## 2018-03-07 MED ORDER — IOPAMIDOL (ISOVUE-M 200) INJECTION 41%
1.0000 mL | Freq: Once | INTRAMUSCULAR | Status: AC
  Administered 2018-03-07: 3 mL via INTRA_ARTICULAR

## 2018-03-12 ENCOUNTER — Other Ambulatory Visit: Payer: Self-pay

## 2018-03-12 ENCOUNTER — Telehealth: Payer: Self-pay

## 2018-03-12 ENCOUNTER — Encounter (HOSPITAL_BASED_OUTPATIENT_CLINIC_OR_DEPARTMENT_OTHER): Payer: Self-pay

## 2018-03-12 DIAGNOSIS — M654 Radial styloid tenosynovitis [de Quervain]: Secondary | ICD-10-CM | POA: Insufficient documentation

## 2018-03-12 NOTE — Telephone Encounter (Signed)
Copied from [place] [id number]. Topic: General - Other >> Mar 12, 2018  1:53 PM Judyann Munson wrote: Reason for CRM: patient is schedule for hand surgery on 03-20-18, she is calling to confirm if it's okay for her to take ibuprofen liquid gel before and after surgery. Please advise

## 2018-03-12 NOTE — Telephone Encounter (Signed)
Yes it is ok to take - take with food and for shortest duration possible

## 2018-03-13 ENCOUNTER — Other Ambulatory Visit: Payer: Self-pay | Admitting: Orthopedic Surgery

## 2018-03-13 NOTE — Telephone Encounter (Signed)
Pt aware of response below.  

## 2018-03-20 ENCOUNTER — Ambulatory Visit (HOSPITAL_BASED_OUTPATIENT_CLINIC_OR_DEPARTMENT_OTHER)
Admission: RE | Admit: 2018-03-20 | Discharge: 2018-03-20 | Disposition: A | Discharge status: Home / Self Care | Payer: Medicare Other | Source: Ambulatory Visit | Attending: Orthopedic Surgery | Admitting: Orthopedic Surgery

## 2018-03-20 ENCOUNTER — Other Ambulatory Visit: Payer: Self-pay

## 2018-03-20 ENCOUNTER — Encounter (HOSPITAL_BASED_OUTPATIENT_CLINIC_OR_DEPARTMENT_OTHER): Payer: Self-pay | Admitting: Anesthesiology

## 2018-03-20 ENCOUNTER — Ambulatory Visit (HOSPITAL_BASED_OUTPATIENT_CLINIC_OR_DEPARTMENT_OTHER): Payer: Medicare Other | Admitting: Anesthesiology

## 2018-03-20 ENCOUNTER — Encounter (HOSPITAL_BASED_OUTPATIENT_CLINIC_OR_DEPARTMENT_OTHER)
Admission: RE | Disposition: A | Discharge status: Home / Self Care | Payer: Self-pay | Source: Ambulatory Visit | Attending: Orthopedic Surgery

## 2018-03-20 DIAGNOSIS — E039 Hypothyroidism, unspecified: Secondary | ICD-10-CM | POA: Diagnosis not present

## 2018-03-20 DIAGNOSIS — Z79890 Hormone replacement therapy (postmenopausal): Secondary | ICD-10-CM | POA: Insufficient documentation

## 2018-03-20 DIAGNOSIS — Z87891 Personal history of nicotine dependence: Secondary | ICD-10-CM | POA: Diagnosis not present

## 2018-03-20 DIAGNOSIS — E785 Hyperlipidemia, unspecified: Secondary | ICD-10-CM | POA: Insufficient documentation

## 2018-03-20 DIAGNOSIS — Z79899 Other long term (current) drug therapy: Secondary | ICD-10-CM | POA: Diagnosis not present

## 2018-03-20 DIAGNOSIS — S63512A Sprain of carpal joint of left wrist, initial encounter: Secondary | ICD-10-CM | POA: Insufficient documentation

## 2018-03-20 DIAGNOSIS — Z7951 Long term (current) use of inhaled steroids: Secondary | ICD-10-CM | POA: Diagnosis not present

## 2018-03-20 DIAGNOSIS — E3451 Complete androgen insensitivity syndrome: Secondary | ICD-10-CM | POA: Diagnosis not present

## 2018-03-20 DIAGNOSIS — M24131 Other articular cartilage disorders, right wrist: Secondary | ICD-10-CM | POA: Diagnosis not present

## 2018-03-20 DIAGNOSIS — X58XXXA Exposure to other specified factors, initial encounter: Secondary | ICD-10-CM | POA: Insufficient documentation

## 2018-03-20 DIAGNOSIS — M25531 Pain in right wrist: Secondary | ICD-10-CM | POA: Diagnosis present

## 2018-03-20 HISTORY — PX: WRIST ARTHROSCOPY WITH FOVEAL TRIANGULAR FIBROCARTILAGE COMPLEX REPAIR: SHX6403

## 2018-03-20 HISTORY — PX: DORSAL COMPARTMENT RELEASE: SHX5039

## 2018-03-20 SURGERY — WRIST ARTHROSCOPY WITH FOVEAL TRIANGULAR FIBROCARTILAGE COMPLEX REPAIR
Anesthesia: Monitor Anesthesia Care | Site: Wrist | Laterality: Right

## 2018-03-20 MED ORDER — HYDROCODONE-ACETAMINOPHEN 5-325 MG PO TABS: 1.0000 | ORAL_TABLET | Freq: Four times a day (QID) | ORAL | 0 refills | Status: DC | PRN

## 2018-03-20 MED ORDER — SCOPOLAMINE 1 MG/3DAYS TD PT72: 1.0000 | MEDICATED_PATCH | Freq: Once | TRANSDERMAL | Status: DC | PRN

## 2018-03-20 MED ORDER — HYDROCODONE-ACETAMINOPHEN 7.5-325 MG PO TABS: 1.0000 | ORAL_TABLET | Freq: Once | ORAL | Status: DC | PRN

## 2018-03-20 MED ORDER — ONDANSETRON HCL 4 MG/2ML IJ SOLN
INTRAMUSCULAR | Status: AC
  Filled 2018-03-20: qty 4

## 2018-03-20 MED ORDER — LIDOCAINE 2% (20 MG/ML) 5 ML SYRINGE
INTRAMUSCULAR | Status: AC
  Filled 2018-03-20: qty 5

## 2018-03-20 MED ORDER — MIDAZOLAM HCL 2 MG/2ML IJ SOLN
1.0000 mg | INTRAMUSCULAR | Status: DC | PRN
  Administered 2018-03-20: 1 mg via INTRAVENOUS

## 2018-03-20 MED ORDER — LACTATED RINGERS IV SOLN
INTRAVENOUS | Status: DC
  Administered 2018-03-20: 08:00:00 via INTRAVENOUS

## 2018-03-20 MED ORDER — CLONIDINE HCL (ANALGESIA) 100 MCG/ML EP SOLN
EPIDURAL | Status: DC | PRN
  Administered 2018-03-20: 50 ug

## 2018-03-20 MED ORDER — FENTANYL CITRATE (PF) 100 MCG/2ML IJ SOLN
INTRAMUSCULAR | Status: AC
  Filled 2018-03-20: qty 2

## 2018-03-20 MED ORDER — ONDANSETRON HCL 4 MG/2ML IJ SOLN
INTRAMUSCULAR | Status: DC | PRN
  Administered 2018-03-20: 4 mg via INTRAVENOUS

## 2018-03-20 MED ORDER — CHLORHEXIDINE GLUCONATE 4 % EX LIQD: 60.0000 mL | Freq: Once | CUTANEOUS | Status: DC

## 2018-03-20 MED ORDER — METOCLOPRAMIDE HCL 5 MG/ML IJ SOLN: 10.0000 mg | Freq: Once | INTRAMUSCULAR | Status: DC | PRN

## 2018-03-20 MED ORDER — MIDAZOLAM HCL 2 MG/2ML IJ SOLN
INTRAMUSCULAR | Status: AC
  Filled 2018-03-20: qty 2

## 2018-03-20 MED ORDER — FENTANYL CITRATE (PF) 100 MCG/2ML IJ SOLN
50.0000 ug | INTRAMUSCULAR | Status: DC | PRN
  Administered 2018-03-20: 50 ug via INTRAVENOUS
  Administered 2018-03-20: 25 ug via INTRAVENOUS

## 2018-03-20 MED ORDER — BUPIVACAINE-EPINEPHRINE (PF) 0.5% -1:200000 IJ SOLN
INTRAMUSCULAR | Status: DC | PRN
  Administered 2018-03-20: 30 mL via PERINEURAL

## 2018-03-20 MED ORDER — CEFAZOLIN SODIUM-DEXTROSE 2-4 GM/100ML-% IV SOLN
INTRAVENOUS | Status: AC
  Filled 2018-03-20: qty 100

## 2018-03-20 MED ORDER — FENTANYL CITRATE (PF) 100 MCG/2ML IJ SOLN: 25.0000 ug | INTRAMUSCULAR | Status: DC | PRN

## 2018-03-20 MED ORDER — PROPOFOL 500 MG/50ML IV EMUL
INTRAVENOUS | Status: DC | PRN
  Administered 2018-03-20: 75 ug/kg/min via INTRAVENOUS

## 2018-03-20 MED ORDER — DEXAMETHASONE SODIUM PHOSPHATE 10 MG/ML IJ SOLN
INTRAMUSCULAR | Status: AC
  Filled 2018-03-20: qty 1

## 2018-03-20 MED ORDER — PROPOFOL 500 MG/50ML IV EMUL
INTRAVENOUS | Status: AC
  Filled 2018-03-20: qty 50

## 2018-03-20 MED ORDER — CEFAZOLIN SODIUM-DEXTROSE 2-4 GM/100ML-% IV SOLN
2.0000 g | INTRAVENOUS | Status: AC
  Administered 2018-03-20: 2 g via INTRAVENOUS

## 2018-03-20 MED ORDER — MEPERIDINE HCL 25 MG/ML IJ SOLN: 6.2500 mg | INTRAMUSCULAR | Status: DC | PRN

## 2018-03-20 SURGICAL SUPPLY — 78 items
BLADE CUDA 2.0 (BLADE) IMPLANT
BLADE EAR TYMPAN 2.5 60D BEAV (BLADE) ×2 IMPLANT
BLADE MINI RND TIP GREEN BEAV (BLADE) IMPLANT
BLADE SURG 15 STRL LF DISP TIS (BLADE) ×1 IMPLANT
BLADE SURG 15 STRL SS (BLADE) ×2
BNDG CMPR 9X4 STRL LF SNTH (GAUZE/BANDAGES/DRESSINGS) ×1
BNDG COHESIVE 3X5 TAN STRL LF (GAUZE/BANDAGES/DRESSINGS) ×2 IMPLANT
BNDG ESMARK 4X9 LF (GAUZE/BANDAGES/DRESSINGS) ×2 IMPLANT
BNDG GAUZE ELAST 4 BULKY (GAUZE/BANDAGES/DRESSINGS) ×2 IMPLANT
BUR CUDA 2.9 (BURR) IMPLANT
BUR FULL RADIUS 2.0 (BURR) IMPLANT
BUR FULL RADIUS 2.9 (BURR) IMPLANT
BUR GATOR 2.9 (BURR) IMPLANT
BUR SPHERICAL 2.9 (BURR) IMPLANT
CANISTER SUCT 1200ML W/VALVE (MISCELLANEOUS) IMPLANT
CHLORAPREP W/TINT 26ML (MISCELLANEOUS) ×2 IMPLANT
CORD BIPOLAR FORCEPS 12FT (ELECTRODE) ×2 IMPLANT
COVER BACK TABLE 60X90IN (DRAPES) ×2 IMPLANT
COVER MAYO STAND STRL (DRAPES) ×2 IMPLANT
CUFF TOURNIQUET SINGLE 18IN (TOURNIQUET CUFF) ×2 IMPLANT
DECANTER SPIKE VIAL GLASS SM (MISCELLANEOUS) IMPLANT
DRAPE EXTREMITY T 121X128X90 (DRAPE) ×2 IMPLANT
DRAPE IMP U-DRAPE 54X76 (DRAPES) ×2 IMPLANT
DRAPE OEC MINIVIEW 54X84 (DRAPES) IMPLANT
DRAPE SURG 17X23 STRL (DRAPES) ×2 IMPLANT
ELECT SMALL JOINT 90D BASC (ELECTRODE) IMPLANT
GAUZE SPONGE 4X4 12PLY STRL (GAUZE/BANDAGES/DRESSINGS) ×2 IMPLANT
GAUZE XEROFORM 1X8 LF (GAUZE/BANDAGES/DRESSINGS) ×2 IMPLANT
GLOVE BIO SURGEON STRL SZ 6.5 (GLOVE) ×2 IMPLANT
GLOVE BIOGEL PI IND STRL 7.0 (GLOVE) ×2 IMPLANT
GLOVE BIOGEL PI IND STRL 8.5 (GLOVE) ×1 IMPLANT
GLOVE BIOGEL PI INDICATOR 7.0 (GLOVE) ×2
GLOVE BIOGEL PI INDICATOR 8.5 (GLOVE) ×1
GLOVE SURG ORTHO 8.0 STRL STRW (GLOVE) ×2 IMPLANT
GOWN STRL REUS W/ TWL LRG LVL3 (GOWN DISPOSABLE) ×1 IMPLANT
GOWN STRL REUS W/TWL LRG LVL3 (GOWN DISPOSABLE) ×2
GOWN STRL REUS W/TWL XL LVL3 (GOWN DISPOSABLE) ×2 IMPLANT
IV NS IRRIG 3000ML ARTHROMATIC (IV SOLUTION) ×2 IMPLANT
IV SET EXT 30 76VOL 4 MALE LL (IV SETS) ×2 IMPLANT
MANIFOLD NEPTUNE II (INSTRUMENTS) IMPLANT
NDL SAFETY ECLIPSE 18X1.5 (NEEDLE) ×3 IMPLANT
NEEDLE HYPO 18GX1.5 SHARP (NEEDLE) ×6
NEEDLE HYPO 22GX1.5 SAFETY (NEEDLE) ×2 IMPLANT
NEEDLE PRECISIONGLIDE 27X1.5 (NEEDLE) IMPLANT
NEEDLE SPNL 18GX3.5 QUINCKE PK (NEEDLE) IMPLANT
NEEDLE TUOHY 20GX3.5 (NEEDLE) IMPLANT
NS IRRIG 1000ML POUR BTL (IV SOLUTION) ×2 IMPLANT
PACK BASIN DAY SURGERY FS (CUSTOM PROCEDURE TRAY) ×2 IMPLANT
PAD CAST 3X4 CTTN HI CHSV (CAST SUPPLIES) ×1 IMPLANT
PADDING CAST ABS 3INX4YD NS (CAST SUPPLIES) ×1
PADDING CAST ABS 4INX4YD NS (CAST SUPPLIES) ×1
PADDING CAST ABS COTTON 3X4 (CAST SUPPLIES) ×1 IMPLANT
PADDING CAST ABS COTTON 4X4 ST (CAST SUPPLIES) ×1 IMPLANT
PADDING CAST COTTON 3X4 STRL (CAST SUPPLIES) ×2
ROUTER HOODED VORTEX 2.9MM (BLADE) IMPLANT
SET SM JOINT TUBING/CANN (CANNULA) IMPLANT
SLEEVE SCD COMPRESS KNEE MED (MISCELLANEOUS) IMPLANT
SLING ARM FOAM STRAP LRG (SOFTGOODS) ×2 IMPLANT
SPLINT PLASTER CAST XFAST 3X15 (CAST SUPPLIES) IMPLANT
SPLINT PLASTER XTRA FASTSET 3X (CAST SUPPLIES)
STOCKINETTE 4X48 STRL (DRAPES) ×2 IMPLANT
SUCTION FRAZIER HANDLE 10FR (MISCELLANEOUS)
SUCTION TUBE FRAZIER 10FR DISP (MISCELLANEOUS) IMPLANT
SUT ETHILON 4 0 PS 2 18 (SUTURE) ×2 IMPLANT
SUT MERSILENE 4 0 P 3 (SUTURE) IMPLANT
SUT PDS AB 2-0 CT2 27 (SUTURE) IMPLANT
SUT STEEL 4 0 (SUTURE) IMPLANT
SUT VIC AB 2-0 PS2 27 (SUTURE) IMPLANT
SUT VIC AB 4-0 P2 18 (SUTURE) IMPLANT
SUT VICRYL 4-0 PS2 18IN ABS (SUTURE) ×2 IMPLANT
SYR BULB 3OZ (MISCELLANEOUS) ×2 IMPLANT
SYR CONTROL 10ML LL (SYRINGE) ×2 IMPLANT
TOWEL GREEN STERILE FF (TOWEL DISPOSABLE) ×4 IMPLANT
TUBE CONNECTING 20X1/4 (TUBING) ×2 IMPLANT
TUBING ARTHRO INFLOW-ONLY STRL (TUBING) IMPLANT
UNDERPAD 30X30 (UNDERPADS AND DIAPERS) ×2 IMPLANT
WAND SHORT BEVEL W/CORD (SURGICAL WAND) IMPLANT
WATER STERILE IRR 1000ML POUR (IV SOLUTION) ×2 IMPLANT

## 2018-03-20 NOTE — Progress Notes (Signed)
Assisted Dr. Royce Macadamia with right, ultrasound guided, axillary block. Side rails up, monitors on throughout procedure. See vital signs in flow sheet. Tolerated Procedure well.

## 2018-03-20 NOTE — Anesthesia Preprocedure Evaluation (Addendum)
Anesthesia Evaluation  Patient identified by MRN, date of birth, ID band Patient awake    Reviewed: Allergy & Precautions, NPO status , Patient's Chart, lab work & pertinent test results  Airway Mallampati: II  TM Distance: >3 FB Neck ROM: Full    Dental no notable dental hx. (+) Teeth Intact   Pulmonary asthma , former smoker,    Pulmonary exam normal breath sounds clear to auscultation       Cardiovascular negative cardio ROS Normal cardiovascular exam Rhythm:Regular Rate:Normal     Neuro/Psych  Headaches,  Neuromuscular disease negative psych ROS   GI/Hepatic negative GI ROS, Neg liver ROS,   Endo/Other  Hypothyroidism Hyperlipidemia  Renal/GU negative Renal ROS  negative genitourinary   Musculoskeletal  (+) Arthritis , Scapholunate tear right wrist Triangular Fibrocartilage Complex tear right wrist DeQuervain's contracture right wrist Left sciatica   Abdominal   Peds  Hematology negative hematology ROS (+)   Anesthesia Other Findings   Reproductive/Obstetrics negative OB ROS                            Anesthesia Physical Anesthesia Plan  ASA: II  Anesthesia Plan: MAC and Regional   Post-op Pain Management:    Induction:   PONV Risk Score and Plan: 2 and Ondansetron, Propofol infusion and Treatment may vary due to age or medical condition  Airway Management Planned: Natural Airway and Simple Face Mask  Additional Equipment:   Intra-op Plan:   Post-operative Plan:   Informed Consent: I have reviewed the patients History and Physical, chart, labs and discussed the procedure including the risks, benefits and alternatives for the proposed anesthesia with the patient or authorized representative who has indicated his/her understanding and acceptance.   Dental advisory given  Plan Discussed with: CRNA and Surgeon  Anesthesia Plan Comments:         Anesthesia Quick  Evaluation

## 2018-03-20 NOTE — Anesthesia Postprocedure Evaluation (Signed)
Anesthesia Post Note  Patient: Pamela Rich  Procedure(s) Performed: RIGHT WRIST ARTHROSCOPY WITH DEBRIDEMENT, SCAPHOLUNATE/TRIANGULAR FIBROCARTILAGE COMPLEX REPAIR (Right Wrist) RIGHT OPEN  RELEASE DORSAL COMPARTMENT (DEQUERVAINS) (Right Wrist)     Patient location during evaluation: PACU Anesthesia Type: Regional Level of consciousness: awake and alert and oriented Pain management: pain level controlled Respiratory status: spontaneous breathing, respiratory function stable and nonlabored ventilation Cardiovascular status: blood pressure returned to baseline and stable Postop Assessment: no apparent nausea or vomiting Anesthetic complications: no    Last Vitals:  Vitals:   03/20/18 1000 03/20/18 1015  BP: (!) 106/58 113/61  Pulse: (!) 59 60  Resp: 12 16  Temp:    SpO2: 94% 96%    Last Pain:  Vitals:   03/20/18 0951  TempSrc:   PainSc: 0-No pain                 [name] A.

## 2018-03-20 NOTE — Anesthesia Procedure Notes (Signed)
Anesthesia Regional Block: Axillary brachial plexus block   Pre-Anesthetic Checklist: ,, timeout performed, Correct Patient, Correct Site, Correct Laterality, Correct Procedure, Correct Position, site marked, Risks and benefits discussed, Surgical consent,  Pre-op evaluation,  At surgeon's request  Laterality: Right  Prep: chloraprep       Needles:  Injection technique: Single-shot  Needle Type: Echogenic Stimulator Needle     Needle Length: 9cm  Needle Gauge: 21   Needle insertion depth: 5 cm   Additional Needles:   Procedures:,,,, ultrasound used (permanent image in chart),,,,  Narrative:  Start time: 03/20/2018 7:45 AM End time: 03/20/2018 7:50 AM Injection made incrementally with aspirations every 5 mL.  Performed by: Personally  Anesthesiologist: Josephine Igo, MD  Additional Notes: Timeout performed. Patient sedated. Relevant anatomy ID'd using [place]. Incremental 2-61ml injection of LA with frequent aspiration. Patient tolerated procedure well.        Right Axillary Block

## 2018-03-20 NOTE — Transfer of Care (Signed)
Immediate Anesthesia Transfer of Care Note  Patient: Pamela Rich  Procedure(s) Performed: RIGHT WRIST ARTHROSCOPY WITH DEBRIDEMENT, SCAPHOLUNATE/TRIANGULAR FIBROCARTILAGE COMPLEX REPAIR (Right Wrist) RIGHT OPEN  RELEASE DORSAL COMPARTMENT (DEQUERVAINS) (Right Wrist)  Patient Location: PACU  Anesthesia Type:MAC combined with regional for post-op pain  Level of Consciousness: awake, alert  and oriented  Airway & Oxygen Therapy: Patient Spontanous Breathing  Post-op Assessment: Report given to RN and Post -op Vital signs reviewed and stable  Post vital signs: Reviewed and stable  Last Vitals:  Vitals Value Taken Time  BP    Temp    Pulse 65 03/20/2018  9:51 AM  Resp    SpO2 94 % 03/20/2018  9:51 AM  Vitals shown include unvalidated device data.  Last Pain:  Vitals:   03/20/18 0727  TempSrc: Oral  PainSc: 0-No pain         Complications: No apparent anesthesia complications

## 2018-03-20 NOTE — H&P (Signed)
Pamela Rich is an 72 y.o. female.   Chief Complaint: pain right wrist HPI Pamela Rich  :s a 72 year old right-hand-dominant female former patient has not been seen in multiple years. She comes in with a complaint of pain in the radial aspect of her right wrist. This emanates from the basilar area of her thumb proximally. This been going on for approximately 2 to 3 weeks. She recalls no history of injury to it. She states that a causing a sore week pain with a VAS score 8/10 with use. He has no history of injury to her neck occasionally awakens her at night. She is states lifting and gripping increases it nonuse improves it. She has used an over-the-counter gel with little relief. She has a history of thyroid problems arthritis no history of diabetes or gout. Family history is negative for each of these also.She was referred for arthrographic MRI. She states since the arthrographic MRI was done. She states it has gotten somewhat better following the injection. This been done read out by Dr. Posey Pronto revealing a moderate to severe tendinosis of the abductor pollicis longus tendon to tear of the dorsal band of the and central portion of the scapholunate ligament. Along with the TFCC tear.    Past Medical History:  Diagnosis Date  . Allergy    SEASONAL  . Fibroid    no surgery  . Heart murmur   . Hyperlipidemia   . Hypothyroidism   . Migraines   . Mild intermittent asthma 07/20/2015   pt is currently not taking any medications  . Osteopenia   . Sciatic nerve lesion 2018  . Vitamin D deficiency     Past Surgical History:  Procedure Laterality Date  . COLONOSCOPY  2014   negative , Dr Ardis Hughs  . cortisone injections     in left hand  . LUMBAR FUSION     Dr Hal Neer  . POLYPECTOMY     uterine  . SEPTOPLASTY    . SINOSCOPY    . TUBAL LIGATION      Family History  Problem Relation Age of Onset  . Hypertension Mother   . Heart disease Mother        CHF; AS  . Osteoporosis Mother   .  Heart attack Mother 38  . Allergic rhinitis Mother   . Diabetes Father   . Stroke Father        onset in 44s  . Cancer Brother        malignant brain tumor  . Colon cancer Neg Hx   . Esophageal cancer Neg Hx   . Rectal cancer Neg Hx   . Stomach cancer Neg Hx   . Angioedema Neg Hx   . Asthma Neg Hx   . Eczema Neg Hx   . Immunodeficiency Neg Hx   . Urticaria Neg Hx    Social History:  reports that she quit smoking about 43 years ago. Her smoking use included cigarettes. She has a 12.00 pack-year smoking history. She has never used smokeless tobacco. She reports that she does not drink alcohol or use drugs.  Allergies:  Allergies  Allergen Reactions  . Pneumococcal Vaccines     Area weakness, redness, and swelling     No medications prior to admission.    No results found for this or any previous visit (from the past 48 hour(s)).  No results found.   Pertinent items are noted in HPI.  Height 5\' 3"  (1.6 m), weight 65.8 kg, last  menstrual period 07/04/2000.  General appearance: alert, cooperative and appears stated age Head: Normocephalic, without obvious abnormality Neck: no JVD Resp: clear to auscultation bilaterally Cardio: regular rate and rhythm, S1, S2 normal, no murmur, click, rub or gallop GI: soft, non-tender; bowel sounds normal; no masses,  no organomegaly Extremities: pain right wrist Pulses: 2+ and symmetric Skin: Skin color, texture, turgor normal. No rashes or lesions Neurologic: Grossly normal Incision/Wound: na  Assessment/Plan Assessment:  1. De Quervain's syndrome (tenosynovitis)  2. Degenerative tear of triangular fibrocartilage complex (TFCC) of right wrist  3. Tear of left scapholunate ligament   Plan: We have discussed possibility of arthroscopic inspection debridement possible shrinkage scapholunate ligament tear. Along with release of the first dorsal compartment of the extensor tendons. The peri-postoperative course are discussed along  with risks and complications. She is aware there is no guarantee to the surgery possibility of infection recurrence injury to arteries nerves tendons complete relief of symptoms dystrophy. She is advised that we will not address the [facility] arthritis at the present time and she may have discomfort following this. She is advised that there may be other structures are more expensive injury to the scapholunate ligament causing her pain. Would not recommend open reconstruction at this time. She would like to proceed she is scheduled for right wrist arthroscopy debridement possible shrinkage along with open release of the first dorsal extensor compartment right wrist as an outpatient under regional anesthesia.    Pamela Rich R 03/20/2018, 5:32 AM

## 2018-03-20 NOTE — Discharge Instructions (Addendum)

## 2018-03-20 NOTE — Brief Op Note (Signed)
03/20/2018  9:49 AM  PATIENT:  Kathryne Gin  72 y.o. female  PRE-OPERATIVE DIAGNOSIS:  scapholunate tear right wrist, Triangular Fibrocartilage Complex Tear, DeQuervain's Right Wrist  POST-OPERATIVE DIAGNOSIS:  scapholunate tear right wrist, Triangular Fibrocartilage Complex Tear, DeQuervain's Right Wrist  PROCEDURE:  Procedure(s): RIGHT WRIST ARTHROSCOPY WITH DEBRIDEMENT, SCAPHOLUNATE/TRIANGULAR FIBROCARTILAGE COMPLEX REPAIR (Right) RIGHT OPEN  RELEASE DORSAL COMPARTMENT (DEQUERVAINS) (Right)  SURGEON:  Surgeon(s) and Role:    Daryll Brod, MD - Primary  PHYSICIAN ASSISTANT:   ASSISTANTS: none   ANESTHESIA:   regional and IV sedation  EBL:  72ml  BLOOD ADMINISTERED:none  DRAINS: none   LOCAL MEDICATIONS USED:  NONE  SPECIMEN:  No Specimen  DISPOSITION OF SPECIMEN:  N/A  COUNTS:  YES  TOURNIQUET:   Total Tourniquet Time Documented: Upper Arm (Right) - 22 minutes Total: Upper Arm (Right) - 22 minutes   DICTATION: .Viviann Spare Dictation  PLAN OF CARE: Discharge to home after PACU  PATIENT DISPOSITION:  PACU - hemodynamically stable.

## 2018-03-20 NOTE — Op Note (Signed)
NAME: Pamela Rich MEDICAL RECORD NO: [id number] DATE OF BIRTH: [birth year] FACILITY: [facility] LOCATION: [facility] PHYSICIAN: Wynonia Sours, MD   OPERATIVE REPORT   DATE OF PROCEDURE: 03/20/18    PREOPERATIVE DIAGNOSIS:   Right wrist pain   POSTOPERATIVE DIAGNOSIS:   Same   PROCEDURE:   Arthroscopy right wrist with debridement of triangular fibrocartilage complex tear open release first dorsal extensor compartment right wrist   SURGEON: Daryll Brod, M.D.   ASSISTANT: none   ANESTHESIA:  Regional with sedation   INTRAVENOUS FLUIDS:  Per anesthesia flow sheet.   ESTIMATED BLOOD LOSS:  Minimal.   COMPLICATIONS:  None.   SPECIMENS:  none   TOURNIQUET TIME:    Total Tourniquet Time Documented: Upper Arm (Right) - 22 minutes Total: Upper Arm (Right) - 22 minutes    DISPOSITION:  Stable to PACU.   INDICATIONS: Patient is a 72 year old female with a history of wrist pain right wrist.  She has de Quervain's tendinitis.  She complains of some discomfort in the wrist joint itself.  MRI reveals a stretching of the scapholunate ligament and a TFCC tear right wrist.  She has not responded to conservative treatment for allergic de Quervain's including multiple injections and splinting.  She is admitted for arthroscopy of the wrist debridement of the TFCC possible debridement shrinkage of the scapholunate ligament and open release of the first dorsal extensor compartment.  Pre-peri-and postoperative course been discussed along with risks and complications.  She is aware that there is no guarantee to the surgery the possibility of infection recurrence injury to arteries nerves tendons complete relief symptoms and dystrophy.  In preoperative area the patient is seen extremity marked by both patient and surgeon antibiotic given a supraclavicular block was carried out by the anesthesia department the preoperative area.  OPERATIVE COURSE: She is brought to the operating room  where she was placed supine position sedated she was prepped and draped using ChloraPrep and a three-minute dry time was allowed and a timeout taken to confirm patient procedure.  The right limb was placed in the arc arthroscopy tower.  10 pounds traction was applied.  The joint was inflated through the 3-4 portal.  A transverse incision was made deepened with a hemostat blunt trocar was used to enter the joint.  Joint was then inspected.  The volar radial wrist ligaments were intact.  Stretching of the scapholunate ligament was noted.  The distal radius and proximal row articular surface did not show show significant degenerative changes.  Scope was brought to the ulnar aspect and irrigation catheter placed in 6 you portal.  A triangular fibrocartilage complex central tear was noted.  A 4-5 portal was then opened after localization with a 22-gauge needle.  A transverse incision was made deepened with a hemostat blunt trocar used to enter the joint.  The area was probed.  Normal trampoline effect was noted to the TFCC.  A full-radius shaver was then inserted and a partial synovectomy performed on the ulnar aspect.  A the Beaver blade angled was then inserted into the 4-5 portal and the central aspect of the TFCC was then cut and removed with a arthroscopic grasper.  The margins of the TFCC cut were then debrided with a full-radius shaver.  Scope was introduced in the 4-5 portal the lunotriquetral joint showed no significant change.  The radial side showed stretching of the scapholunate ligament.  A 22-gauge needle was then used to localize the midcarpal ulnar portal.  A transverse incision made deepened with a hemostat blunt trocar used to enter the joint.  The scope was introduced on the ulnar portal the lunotriquetral joint did not show significant instability.  There was some changes on the proximal aspect of the hamate there were no changes on the proximal pole of the capitate.  Minimal instability was noted to  the scapholunate ligament complex.  Significant synovitis was present around the STT joint.  Further debridement was performed.  Scope and instruments were removed.  The limb was exsanguinated with an Esmarch bandage turn placed on the arm was inflated to 250 mmHg.  A longitudinal incision was then made over the fourth first dorsal extensor compartment.  This carried down through subcutaneous tissue bleeders and neurovascular structures were gently retracted.  The incision was then made on the dorsal aspect of the first dorsal extensor compartment.  This allowed visualization of the EPB tendon which was in a separate compartment from the abductor pollicis longus tendon.  The 2 compartments were open the septum was debrided.  A very significant synovitis was present proximally.  This was debrided.  Entire first dorsal extensor compartment was released on its dorsal aspect.  Wound was copiously irrigated with saline.  The subcutaneous tissue was closed with interrupted 4-0 Vicryl sutures and the skin with interrupted 4-0 nylon sutures for the incision for the first dorsal extensor compartment and the portals for the arthroscopy.  A sterile compressive dressing thumb spica splint was applied.  Inflation the tourniquet all fingers immediately pink.  She was taken to the recovery room for observation in satisfactory condition.  She will be discharged home to return [place] and Tylenol and ibuprofen with Norco for breakthrough.   Wynonia Sours, MD Electronically signed, 03/20/18

## 2018-03-21 ENCOUNTER — Encounter (HOSPITAL_BASED_OUTPATIENT_CLINIC_OR_DEPARTMENT_OTHER): Payer: Self-pay | Admitting: Orthopedic Surgery

## 2018-03-21 ENCOUNTER — Telehealth: Payer: Self-pay

## 2018-03-21 NOTE — Telephone Encounter (Signed)
Pt is on TCM List. Dc'ed on 03/20/2018 after right wrist arthroscopy with debridement. Pt to follow up with Ortho.

## 2018-04-13 ENCOUNTER — Ambulatory Visit (INDEPENDENT_AMBULATORY_CARE_PROVIDER_SITE_OTHER): Payer: Medicare Other

## 2018-04-13 DIAGNOSIS — Z23 Encounter for immunization: Secondary | ICD-10-CM | POA: Diagnosis not present

## 2018-04-16 ENCOUNTER — Telehealth: Payer: Self-pay | Admitting: Internal Medicine

## 2018-04-16 NOTE — Telephone Encounter (Signed)
Spoke with Pamela Rich regarding AWV. Patient stated she is not interested in scheduling wellness visit for this year. SF

## 2018-06-22 ENCOUNTER — Other Ambulatory Visit: Payer: Self-pay | Admitting: Family Medicine

## 2018-06-22 DIAGNOSIS — J069 Acute upper respiratory infection, unspecified: Secondary | ICD-10-CM

## 2018-06-25 ENCOUNTER — Other Ambulatory Visit: Payer: Self-pay | Admitting: Internal Medicine

## 2018-06-26 ENCOUNTER — Encounter: Payer: Self-pay | Admitting: Family

## 2018-06-26 ENCOUNTER — Ambulatory Visit: Payer: Medicare Other | Admitting: Family

## 2018-06-26 VITALS — BP 124/70 | HR 87 | Temp 97.8°F | Ht 63.0 in | Wt 152.0 lb

## 2018-06-26 DIAGNOSIS — J45909 Unspecified asthma, uncomplicated: Secondary | ICD-10-CM | POA: Diagnosis not present

## 2018-06-26 MED ORDER — DOXYCYCLINE HYCLATE 100 MG PO TABS: 100.0000 mg | ORAL_TABLET | Freq: Two times a day (BID) | ORAL | 0 refills | Status: DC

## 2018-06-26 MED ORDER — HYDROCODONE-HOMATROPINE 5-1.5 MG/5ML PO SYRP: 5.0000 mL | ORAL_SOLUTION | Freq: Three times a day (TID) | ORAL | 0 refills | Status: DC | PRN

## 2018-06-26 NOTE — Progress Notes (Signed)
Pamela Rich is a 72 y.o. female with the following history as recorded in [employer]:  Patient Active Problem List   Diagnosis Date Noted  . Hearing loss, wears hearing aids 06/22/2017  . Radicular pain of left lower extremity 02/28/2017  . Chronic pain of both knees 06/21/2016  . Mild sinusitis 06/07/2016  . Mild persistent asthma 07/20/2015  . Other allergic rhinitis 07/20/2015  . Mixed hyperlipidemia 03/01/2012  . Migraine without aura 04/05/2010  . FACIAL PARESTHESIA, LEFT 10/16/2009  . Vitamin D deficiency 03/19/2009  . DEGENERATIVE JOINT DISEASE 12/05/2007  . NOCTURIA 12/05/2007  . Hypothyroidism 01/22/2007  . IBS 09/28/2006  . Osteopenia 09/28/2006    Current Outpatient Medications  Medication Sig Dispense Refill  . albuterol (PROAIR HFA) 108 (90 Base) MCG/ACT inhaler Inhale 2 puffs into the lungs every 4 (four) hours as needed for wheezing. Or coughing spells. (Patient taking differently: Inhale 2 puffs into the lungs every 4 (four) hours as needed for wheezing (using 1 puff daily prn). Or coughing spells.) 1 Inhaler 1  . Ascorbic Acid (VITAMIN C) 1000 MG tablet Take 1,000 mg by mouth daily.      Marland Kitchen aspirin 81 MG tablet Take 81 mg by mouth daily.    Marland Kitchen azelastine (ASTELIN) 0.1 % nasal spray Use in each nostril as directed 30 mL 0  . Cholecalciferol (VITAMIN D3) 1000 UNITS CAPS Take by mouth daily.      . fluticasone (FLONASE) 50 MCG/ACT nasal spray USE 2 SPRAYS IN EACH NOSTRIL EVERY DAY FOR STUFFY NOSE OR DRAINAGE 16 g 11  . fluticasone (FLOVENT HFA) 44 MCG/ACT inhaler Inhale 2 puffs into the lungs 2 (two) times daily. 09.6 g 11  . folic acid (FOLVITE) 1 MG tablet Take 1 mg by mouth daily.    Marland Kitchen levothyroxine (SYNTHROID, LEVOTHROID) 50 MCG tablet TAKE 2 TABLETS (100 MCG TOTAL) BY MOUTH DAILY. 180 tablet 0  . Multiple Vitamin (MULTIVITAMIN PO) Take by mouth daily.      . Multiple Vitamin (MULTIVITAMIN) tablet Take by mouth.    . pravastatin (PRAVACHOL) 20 MG tablet TAKE 1  TABLET BY MOUTH EVERY DAY 90 tablet 0  . doxycycline (VIBRA-TABS) 100 MG tablet Take 1 tablet (100 mg total) by mouth 2 (two) times daily. 20 tablet 0  . HYDROcodone-homatropine (HYCODAN) 5-1.5 MG/5ML syrup Take 5 mLs by mouth every 8 (eight) hours as needed for cough. 100 mL 0   No current facility-administered medications for this visit.     Allergies: Pneumococcal vaccines  Past Medical History:  Diagnosis Date  . Allergy    SEASONAL  . Fibroid    no surgery  . Heart murmur   . Hyperlipidemia   . Hypothyroidism   . Migraines   . Mild intermittent asthma 07/20/2015   pt is currently not taking any medications  . Osteopenia   . Sciatic nerve lesion 2018  . Vitamin D deficiency     Past Surgical History:  Procedure Laterality Date  . COLONOSCOPY  2014   negative , Dr Ardis Hughs  . cortisone injections     in left hand  . DORSAL COMPARTMENT RELEASE Right 03/20/2018   Procedure: RIGHT OPEN  RELEASE DORSAL COMPARTMENT (DEQUERVAINS);  Surgeon: Daryll Brod, MD;  Location: [facility];  Service: Orthopedics;  Laterality: Right;  . LUMBAR FUSION     Dr Hal Neer  . POLYPECTOMY     uterine  . SEPTOPLASTY    . SINOSCOPY    . TUBAL LIGATION    .  WRIST ARTHROSCOPY WITH FOVEAL TRIANGULAR FIBROCARTILAGE COMPLEX REPAIR Right 03/20/2018   Procedure: RIGHT WRIST ARTHROSCOPY WITH DEBRIDEMENT, SCAPHOLUNATE/TRIANGULAR FIBROCARTILAGE COMPLEX REPAIR;  Surgeon: Daryll Brod, MD;  Location: [facility];  Service: Orthopedics;  Laterality: Right;    Family History  Problem Relation Age of Onset  . Hypertension Mother   . Heart disease Mother        CHF; AS  . Osteoporosis Mother   . Heart attack Mother 8  . Allergic rhinitis Mother   . Diabetes Father   . Stroke Father        onset in 44s  . Cancer Brother        malignant brain tumor  . Colon cancer Neg Hx   . Esophageal cancer Neg Hx   . Rectal cancer Neg Hx   . Stomach cancer Neg Hx   . Angioedema Neg Hx   .  Asthma Neg Hx   . Eczema Neg Hx   . Immunodeficiency Neg Hx   . Urticaria Neg Hx     Social History   Tobacco Use  . Smoking status: Former Smoker    Packs/day: 0.30    Years: 40.00    Pack years: 12.00    Types: Cigarettes    Last attempt to quit: 07/04/1974    Years since quitting: 44.0  . Smokeless tobacco: Never Used  . Tobacco comment: smoked 1965-1976 , < 3 cig/ day  Substance Use Topics  . Alcohol use: No    Alcohol/week: 0.0 standard drinks    Comment: rarely    Subjective:  Patient presents with concerns for cough/ congestion x 3 weeks; seemed to worsen in the past 4 days; does have history of asthma- not taking her inhalers; felt like she started running a fever over the weekend; has been using OTC Mucinex and Tylenol; no chest pain, no shortness of breath; feels like cough is keeping awake at night;     Objective:  Vitals:   06/26/18 0926  BP: 124/70  Pulse: 87  Temp: 97.8 F (36.6 C)  TempSrc: Oral  SpO2: 95%  Weight: 152 lb 0.6 oz (69 kg)  Height: 5\' 3"  (1.6 m)    General: Well developed, well nourished, in no acute distress  Skin : Warm and dry.  Head: Normocephalic and atraumatic  Eyes: Sclera and conjunctiva clear; pupils round and reactive to light; extraocular movements intact  Ears: External normal; canals clear; tympanic membranes normal  Oropharynx: Pink, supple. No suspicious lesions  Neck: Supple without thyromegaly, adenopathy  Lungs: Respirations unlabored; clear to auscultation bilaterally without wheeze, rales, rhonchi  CVS exam: normal rate and regular rhythm.  Neurologic: Alert and oriented; speech intact; face symmetrical; moves all extremities well; CNII-XII intact without focal deficit   Assessment:  1. Acute asthmatic bronchitis     Plan:  Rx for Doxycycline 100 mg bid x 10 days; stressed need for her to use her Flovent twice a day x 7-10 days; do not feel CXR is necessary today; Rx for Hycodan cough syrup to use at night; increase  fluids, rest; keep planned follow-up with her PCP for January 8;   No follow-ups on file.  No orders of the defined types were placed in this encounter.   Requested Prescriptions   Signed Prescriptions Disp Refills  . doxycycline (VIBRA-TABS) 100 MG tablet 20 tablet 0    Sig: Take 1 tablet (100 mg total) by mouth 2 (two) times daily.  Marland Kitchen HYDROcodone-homatropine (HYCODAN) 5-1.5 MG/5ML syrup 100  mL 0    Sig: Take 5 mLs by mouth every 8 (eight) hours as needed for cough.

## 2018-06-29 ENCOUNTER — Other Ambulatory Visit: Payer: Self-pay | Admitting: *Deleted

## 2018-06-29 MED ORDER — FLUTICASONE PROPIONATE HFA 44 MCG/ACT IN AERO: 2.0000 | INHALATION_SPRAY | Freq: Two times a day (BID) | RESPIRATORY_TRACT | 5 refills | Status: DC

## 2018-06-29 MED ORDER — ALBUTEROL SULFATE HFA 108 (90 BASE) MCG/ACT IN AERS: 2.0000 | INHALATION_SPRAY | RESPIRATORY_TRACT | 1 refills | Status: DC | PRN

## 2018-07-10 NOTE — Progress Notes (Signed)
Subjective:    Patient ID: Pamela Rich, female    DOB: [birth year], 73 y.o.   MRN: [id number]  HPI She is here for a physical exam.   Her cough is better, but she is still hoarse.  She does not feel 100%.   She uses saline spray and flonase.  She uses her inhalers.  She still has a little nasal congestion at times.  She was mostly concerned about the hoarseness.   She has no other concerns.    Medications and allergies reviewed with patient and updated if appropriate.  Patient Active Problem List   Diagnosis Date Noted  . Hearing loss, wears hearing aids 06/22/2017  . Radicular pain of left lower extremity 02/28/2017  . Chronic pain of both knees 06/21/2016  . Mild sinusitis 06/07/2016  . Mild persistent asthma 07/20/2015  . Other allergic rhinitis 07/20/2015  . Mixed hyperlipidemia 03/01/2012  . Migraine without aura 04/05/2010  . FACIAL PARESTHESIA, LEFT 10/16/2009  . Vitamin D deficiency 03/19/2009  . DEGENERATIVE JOINT DISEASE 12/05/2007  . NOCTURIA 12/05/2007  . Hypothyroidism 01/22/2007  . IBS 09/28/2006  . Osteopenia 09/28/2006    Current Outpatient Medications on File Prior to Visit  Medication Sig Dispense Refill  . albuterol (PROAIR HFA) 108 (90 Base) MCG/ACT inhaler Inhale 2 puffs into the lungs every 4 (four) hours as needed for wheezing (using 1 puff daily prn). Or coughing spells. 1 Inhaler 1  . Ascorbic Acid (VITAMIN C) 1000 MG tablet Take 1,000 mg by mouth daily.      Marland Kitchen aspirin 81 MG tablet Take 81 mg by mouth daily.    Marland Kitchen azelastine (ASTELIN) 0.1 % nasal spray Use in each nostril as directed 30 mL 0  . Cholecalciferol (VITAMIN D3) 1000 UNITS CAPS Take by mouth daily.      . fluticasone (FLONASE) 50 MCG/ACT nasal spray USE 2 SPRAYS IN EACH NOSTRIL EVERY DAY FOR STUFFY NOSE OR DRAINAGE 16 g 11  . fluticasone (FLOVENT HFA) 44 MCG/ACT inhaler Inhale 2 puffs into the lungs 2 (two) times daily. 96.7 g 5  . folic acid (FOLVITE) 1 MG tablet Take 1 mg by mouth  daily.    Marland Kitchen levothyroxine (SYNTHROID, LEVOTHROID) 50 MCG tablet TAKE 2 TABLETS (100 MCG TOTAL) BY MOUTH DAILY. 180 tablet 0  . Multiple Vitamin (MULTIVITAMIN PO) Take by mouth daily.      . Multiple Vitamin (MULTIVITAMIN) tablet Take by mouth.    . pravastatin (PRAVACHOL) 20 MG tablet TAKE 1 TABLET BY MOUTH EVERY DAY 90 tablet 0   No current facility-administered medications on file prior to visit.     Past Medical History:  Diagnosis Date  . Allergy    SEASONAL  . Fibroid    no surgery  . Heart murmur   . Hyperlipidemia   . Hypothyroidism   . Migraines   . Mild intermittent asthma 07/20/2015   pt is currently not taking any medications  . Osteopenia   . Sciatic nerve lesion 2018  . Vitamin D deficiency     Past Surgical History:  Procedure Laterality Date  . COLONOSCOPY  2014   negative , Dr Ardis Hughs  . cortisone injections     in left hand  . DORSAL COMPARTMENT RELEASE Right 03/20/2018   Procedure: RIGHT OPEN  RELEASE DORSAL COMPARTMENT (DEQUERVAINS);  Surgeon: Daryll Brod, MD;  Location: [facility];  Service: Orthopedics;  Laterality: Right;  . LUMBAR FUSION     Dr Hal Neer  .  POLYPECTOMY     uterine  . SEPTOPLASTY    . SINOSCOPY    . TUBAL LIGATION    . WRIST ARTHROSCOPY WITH FOVEAL TRIANGULAR FIBROCARTILAGE COMPLEX REPAIR Right 03/20/2018   Procedure: RIGHT WRIST ARTHROSCOPY WITH DEBRIDEMENT, SCAPHOLUNATE/TRIANGULAR FIBROCARTILAGE COMPLEX REPAIR;  Surgeon: Daryll Brod, MD;  Location: [facility];  Service: Orthopedics;  Laterality: Right;    Social History   Socioeconomic History  . Marital status: Widowed    Spouse name: Not on file  . Number of children: 3  . Years of education: Not on file  . Highest education level: Not on file  Occupational History  . Occupation: retired  Scientific laboratory technician  . Financial resource strain: Not on file  . Food insecurity:    Worry: Not on file    Inability: Not on file  . Transportation needs:     Medical: Not on file    Non-medical: Not on file  Tobacco Use  . Smoking status: Former Smoker    Packs/day: 0.30    Years: 40.00    Pack years: 12.00    Types: Cigarettes    Last attempt to quit: 07/04/1974    Years since quitting: 44.0  . Smokeless tobacco: Never Used  . Tobacco comment: smoked 1965-1976 , < 3 cig/ day  Substance and Sexual Activity  . Alcohol use: No    Alcohol/week: 0.0 standard drinks    Comment: rarely  . Drug use: No  . Sexual activity: Not Currently    Partners: Male    Birth control/protection: Surgical, Post-menopausal    Comment: BTL  Lifestyle  . Physical activity:    Days per week: Not on file    Minutes per session: Not on file  . Stress: Not on file  Relationships  . Social connections:    Talks on phone: Not on file    Gets together: Not on file    Attends religious service: Not on file    Active member of club or organization: Not on file    Attends meetings of clubs or organizations: Not on file    Relationship status: Not on file  Other Topics Concern  . Not on file  Social History Narrative   Exercise: walking, stretching, weights    Family History  Problem Relation Age of Onset  . Hypertension Mother   . Heart disease Mother        CHF; AS  . Osteoporosis Mother   . Heart attack Mother 70  . Allergic rhinitis Mother   . Diabetes Father   . Stroke Father        onset in 86s  . Cancer Brother        malignant brain tumor  . Colon cancer Neg Hx   . Esophageal cancer Neg Hx   . Rectal cancer Neg Hx   . Stomach cancer Neg Hx   . Angioedema Neg Hx   . Asthma Neg Hx   . Eczema Neg Hx   . Immunodeficiency Neg Hx   . Urticaria Neg Hx     Review of Systems  Constitutional: Negative for chills and fever.  HENT: Positive for congestion (intermittent) and voice change (hoarseness). Negative for ear pain, sinus pain and sore throat (occasional wakes up with sore throat).   Eyes: Negative for visual disturbance.  Respiratory:  Positive for cough ( a little - much better). Negative for shortness of breath and wheezing.   Cardiovascular: Negative for chest pain and palpitations.  Gastrointestinal:  Negative for abdominal pain, blood in stool, constipation, diarrhea and nausea.       Gerd frequently  Genitourinary: Negative for dysuria and hematuria.  Musculoskeletal: Positive for arthralgias.  Skin: Negative for color change and rash.  Neurological: Negative for light-headedness and headaches.  Psychiatric/Behavioral: Negative for dysphoric mood. The patient is not nervous/anxious.        Objective:   Vitals:   07/11/18 1058  BP: 140/82  Pulse: 71  Resp: 16  Temp: 98.4 F (36.9 C)  SpO2: 97%   Filed Weights   07/11/18 1058  Weight: 148 lb (67.1 kg)   Body mass index is 26.22 kg/m.  BP Readings from Last 3 Encounters:  07/11/18 140/82  06/26/18 124/70  03/20/18 137/65    Wt Readings from Last 3 Encounters:  07/11/18 148 lb (67.1 kg)  06/26/18 152 lb 0.6 oz (69 kg)  03/20/18 145 lb 15.1 oz (66.2 kg)     Physical Exam Constitutional: She appears well-developed and well-nourished. No distress.  HENT:  Head: Normocephalic and atraumatic.  Right Ear: External ear normal. Normal ear canal and TM Left Ear: External ear normal.  Normal ear canal and TM Mouth/Throat: Oropharynx is clear and moist.  Eyes: Conjunctivae and EOM are normal.  Neck: Neck supple. No tracheal deviation present. No thyromegaly present.  No carotid bruit  Cardiovascular: Normal rate, regular rhythm and normal heart sounds.   No murmur heard.  No edema. Pulmonary/Chest: Effort normal and breath sounds normal. No respiratory distress. She has no wheezes. She has no rales.  Breast: deferred to Gyn Abdominal: Soft. She exhibits no distension. There is no tenderness.  Lymphadenopathy: She has no cervical adenopathy.  Skin: Skin is warm and dry. She is not diaphoretic.  Psychiatric: She has a normal mood and affect. Her  behavior is normal.        Assessment & Plan:   Advised that the hoarseness will likely improve with time.  No evidence of residual infection or need for additional medication.  Continue allergy and asthma medications.  Rest voice.  Physical exam: Screening blood work ordered Immunizations  Discussed shingrix, others up to date Colonoscopy  Up to date  Mammogram   Up to date  Gyn   Up to date  Dexa    Up to date  Eye exams   Up to date  EKG  Done 2014 Exercise  Walks, yoga, exercise class once a week Weight   Normal BMI Skin  - sees derm annually Substance abuse   none  See Problem List for Assessment and Plan of chronic medical problems.  FU in one year

## 2018-07-11 ENCOUNTER — Ambulatory Visit (INDEPENDENT_AMBULATORY_CARE_PROVIDER_SITE_OTHER): Payer: Medicare Other | Admitting: Internal Medicine

## 2018-07-11 ENCOUNTER — Encounter: Payer: Self-pay | Admitting: Internal Medicine

## 2018-07-11 ENCOUNTER — Other Ambulatory Visit (INDEPENDENT_AMBULATORY_CARE_PROVIDER_SITE_OTHER): Payer: Medicare Other

## 2018-07-11 VITALS — BP 140/82 | HR 71 | Temp 98.4°F | Resp 16 | Ht 63.0 in | Wt 148.0 lb

## 2018-07-11 DIAGNOSIS — K219 Gastro-esophageal reflux disease without esophagitis: Secondary | ICD-10-CM

## 2018-07-11 DIAGNOSIS — E039 Hypothyroidism, unspecified: Secondary | ICD-10-CM | POA: Diagnosis not present

## 2018-07-11 DIAGNOSIS — M8580 Other specified disorders of bone density and structure, unspecified site: Secondary | ICD-10-CM

## 2018-07-11 DIAGNOSIS — E782 Mixed hyperlipidemia: Secondary | ICD-10-CM

## 2018-07-11 DIAGNOSIS — Z0000 Encounter for general adult medical examination without abnormal findings: Secondary | ICD-10-CM

## 2018-07-11 LAB — TSH: TSH: 2.14 u[IU]/mL (ref 0.35–4.50)

## 2018-07-11 LAB — CBC WITH DIFFERENTIAL/PLATELET
BASOS ABS: 0.1 10*3/uL (ref 0.0–0.1)
Basophils Relative: 0.6 % (ref 0.0–3.0)
Eosinophils Absolute: 0.3 10*3/uL (ref 0.0–0.7)
Eosinophils Relative: 3.5 % (ref 0.0–5.0)
HCT: 41.9 % (ref 36.0–46.0)
Hemoglobin: 14.3 g/dL (ref 12.0–15.0)
Lymphocytes Relative: 23.9 % (ref 12.0–46.0)
Lymphs Abs: 2.1 10*3/uL (ref 0.7–4.0)
MCHC: 34.2 g/dL (ref 30.0–36.0)
MCV: 92.9 fl (ref 78.0–100.0)
Monocytes Absolute: 0.7 10*3/uL (ref 0.1–1.0)
Monocytes Relative: 7.5 % (ref 3.0–12.0)
Neutro Abs: 5.7 10*3/uL (ref 1.4–7.7)
Neutrophils Relative %: 64.5 % (ref 43.0–77.0)
Platelets: 384 10*3/uL (ref 150.0–400.0)
RBC: 4.51 Mil/uL (ref 3.87–5.11)
RDW: 13 % (ref 11.5–15.5)
WBC: 8.9 10*3/uL (ref 4.0–10.5)

## 2018-07-11 LAB — COMPREHENSIVE METABOLIC PANEL
ALT: 10 U/L (ref 0–35)
AST: 20 U/L (ref 0–37)
Albumin: 4.2 g/dL (ref 3.5–5.2)
Alkaline Phosphatase: 72 U/L (ref 39–117)
BUN: 16 mg/dL (ref 6–23)
CO2: 29 meq/L (ref 19–32)
Calcium: 9.8 mg/dL (ref 8.4–10.5)
Chloride: 99 mEq/L (ref 96–112)
Creatinine, Ser: 0.84 mg/dL (ref 0.40–1.20)
GFR: 70.72 mL/min (ref >60.00)
Glucose, Bld: 98 mg/dL (ref 70–99)
Potassium: 4.1 mEq/L (ref 3.5–5.1)
Sodium: 136 mEq/L (ref 135–145)
Total Bilirubin: 0.4 mg/dL (ref 0.2–1.2)
Total Protein: 7.3 g/dL (ref 6.0–8.3)

## 2018-07-11 LAB — LIPID PANEL
CHOL/HDL RATIO: 3
Cholesterol: 206 mg/dL — ABNORMAL HIGH (ref 0–200)
HDL: 62.6 mg/dL (ref >39.00)
LDL CALC: 110 mg/dL — AB (ref 0–99)
NonHDL: 143.37
Triglycerides: 165 mg/dL — ABNORMAL HIGH (ref 0.0–149.0)
VLDL: 33 mg/dL (ref 0.0–40.0)

## 2018-07-11 NOTE — Assessment & Plan Note (Signed)
Check lipid panel, CMP Continue daily statin Regular exercise and healthy diet encouraged  

## 2018-07-11 NOTE — Assessment & Plan Note (Signed)
Clinically euthyroid Check tsh  Titrate med dose if needed  

## 2018-07-11 NOTE — Assessment & Plan Note (Signed)
Having increased GERD Discussed that this can be damaged the esophagus, flare of her asthma or allergies and cause hoarseness Recommend starting Pepcid 1-2 times daily until better controlled and then try to taper off Discussed lifestyle changes and avoiding certain foods

## 2018-07-11 NOTE — Assessment & Plan Note (Signed)
Bone density up-to-date DEXA ordered by Dr. Sabra Heck Exercises regularly Taking vitamin D daily

## 2018-07-11 NOTE — Patient Instructions (Addendum)
Tests ordered today. Your results will be released to MyChart (or called to you) after review, usually within 72hours after test completion. If any changes need to be made, you will be notified at that same time.  All other Health Maintenance issues reviewed.   All recommended immunizations and age-appropriate screenings are up-to-date or discussed.  No immunizations administered today.   Medications reviewed and updated.  Changes include :  none    Please followup in one year   Health Maintenance, Female Adopting a healthy lifestyle and getting preventive care can go a long way to promote health and wellness. Talk with your health care provider about what schedule of regular examinations is right for you. This is a good chance for you to check in with your provider about disease prevention and staying healthy. In between checkups, there are plenty of things you can do on your own. Experts have done a lot of research about which lifestyle changes and preventive measures are most likely to keep you healthy. Ask your health care provider for more information. Weight and diet Eat a healthy diet  Be sure to include plenty of vegetables, fruits, low-fat dairy products, and lean protein.  Do not eat a lot of foods high in solid fats, added sugars, or salt.  Get regular exercise. This is one of the most important things you can do for your health. ? Most adults should exercise for at least 150 minutes each week. The exercise should increase your heart rate and make you sweat (moderate-intensity exercise). ? Most adults should also do strengthening exercises at least twice a week. This is in addition to the moderate-intensity exercise. Maintain a healthy weight  Body mass index (BMI) is a measurement that can be used to identify possible weight problems. It estimates body fat based on height and weight. Your health care provider can help determine your BMI and help you achieve or maintain a  healthy weight.  For females 20 years of age and older: ? A BMI below 18.5 is considered underweight. ? A BMI of 18.5 to 24.9 is normal. ? A BMI of 25 to 29.9 is considered overweight. ? A BMI of 30 and above is considered obese. Watch levels of cholesterol and blood lipids  You should start having your blood tested for lipids and cholesterol at 73 years of age, then have this test every 5 years.  You may need to have your cholesterol levels checked more often if: ? Your lipid or cholesterol levels are high. ? You are older than 73 years of age. ? You are at high risk for heart disease. Cancer screening Lung Cancer  Lung cancer screening is recommended for adults 55-80 years old who are at high risk for lung cancer because of a history of smoking.  A yearly low-dose CT scan of the lungs is recommended for people who: ? Currently smoke. ? Have quit within the past 15 years. ? Have at least a 30-pack-year history of smoking. A pack year is smoking an average of one pack of cigarettes a day for 1 year.  Yearly screening should continue until it has been 15 years since you quit.  Yearly screening should stop if you develop a health problem that would prevent you from having lung cancer treatment. Breast Cancer  Practice breast self-awareness. This means understanding how your breasts normally appear and feel.  It also means doing regular breast self-exams. Let your health care provider know about any changes, no matter how small.    If you are in your 20s or 30s, you should have a clinical breast exam (CBE) by a health care provider every 1-3 years as part of a regular health exam.  If you are 40 or older, have a CBE every year. Also consider having a breast X-ray (mammogram) every year.  If you have a family history of breast cancer, talk to your health care provider about genetic screening.  If you are at high risk for breast cancer, talk to your health care provider about having  an MRI and a mammogram every year.  Breast cancer gene (BRCA) assessment is recommended for women who have family members with BRCA-related cancers. BRCA-related cancers include: ? Breast. ? Ovarian. ? Tubal. ? Peritoneal cancers.  Results of the assessment will determine the need for genetic counseling and BRCA1 and BRCA2 testing. Cervical Cancer Your health care provider may recommend that you be screened regularly for cancer of the pelvic organs (ovaries, uterus, and vagina). This screening involves a pelvic examination, including checking for microscopic changes to the surface of your cervix (Pap test). You may be encouraged to have this screening done every 3 years, beginning at age 21.  For women ages 30-65, health care providers may recommend pelvic exams and Pap testing every 3 years, or they may recommend the Pap and pelvic exam, combined with testing for human papilloma virus (HPV), every 5 years. Some types of HPV increase your risk of cervical cancer. Testing for HPV may also be done on women of any age with unclear Pap test results.  Other health care providers may not recommend any screening for nonpregnant women who are considered low risk for pelvic cancer and who do not have symptoms. Ask your health care provider if a screening pelvic exam is right for you.  If you have had past treatment for cervical cancer or a condition that could lead to cancer, you need Pap tests and screening for cancer for at least 20 years after your treatment. If Pap tests have been discontinued, your risk factors (such as having a new sexual partner) need to be reassessed to determine if screening should resume. Some women have medical problems that increase the chance of getting cervical cancer. In these cases, your health care provider may recommend more frequent screening and Pap tests. Colorectal Cancer  This type of cancer can be detected and often prevented.  Routine colorectal cancer screening  usually begins at 73 years of age and continues through 73 years of age.  Your health care provider may recommend screening at an earlier age if you have risk factors for colon cancer.  Your health care provider may also recommend using home test kits to check for hidden blood in the stool.  A small camera at the end of a tube can be used to examine your colon directly (sigmoidoscopy or colonoscopy). This is done to check for the earliest forms of colorectal cancer.  Routine screening usually begins at age 50.  Direct examination of the colon should be repeated every 5-10 years through 73 years of age. However, you may need to be screened more often if early forms of precancerous polyps or small growths are found. Skin Cancer  Check your skin from head to toe regularly.  Tell your health care provider about any new moles or changes in moles, especially if there is a change in a mole's shape or color.  Also tell your health care provider if you have a mole that is larger than the   size of a pencil eraser.  Always use sunscreen. Apply sunscreen liberally and repeatedly throughout the day.  Protect yourself by wearing long sleeves, pants, a wide-brimmed hat, and sunglasses whenever you are outside. Heart disease, diabetes, and high blood pressure  High blood pressure causes heart disease and increases the risk of stroke. High blood pressure is more likely to develop in: ? People who have blood pressure in the high end of the normal range (130-139/85-89 mm Hg). ? People who are overweight or obese. ? People who are African American.  If you are 18-39 years of age, have your blood pressure checked every 3-5 years. If you are 40 years of age or older, have your blood pressure checked every year. You should have your blood pressure measured twice-once when you are at a hospital or clinic, and once when you are not at a hospital or clinic. Record the average of the two measurements. To check your  blood pressure when you are not at a hospital or clinic, you can use: ? An automated blood pressure machine at a pharmacy. ? A home blood pressure monitor.  If you are between 55 years and 79 years old, ask your health care provider if you should take aspirin to prevent strokes.  Have regular diabetes screenings. This involves taking a blood sample to check your fasting blood sugar level. ? If you are at a normal weight and have a low risk for diabetes, have this test once every three years after 73 years of age. ? If you are overweight and have a high risk for diabetes, consider being tested at a younger age or more often. Preventing infection Hepatitis B  If you have a higher risk for hepatitis B, you should be screened for this virus. You are considered at high risk for hepatitis B if: ? You were born in a country where hepatitis B is common. Ask your health care provider which countries are considered high risk. ? Your parents were born in a high-risk country, and you have not been immunized against hepatitis B (hepatitis B vaccine). ? You have HIV or AIDS. ? You use needles to inject street drugs. ? You live with someone who has hepatitis B. ? You have had sex with someone who has hepatitis B. ? You get hemodialysis treatment. ? You take certain medicines for conditions, including cancer, organ transplantation, and autoimmune conditions. Hepatitis C  Blood testing is recommended for: ? Everyone born from 1945 through 1965. ? Anyone with known risk factors for hepatitis C. Sexually transmitted infections (STIs)  You should be screened for sexually transmitted infections (STIs) including gonorrhea and chlamydia if: ? You are sexually active and are younger than 73 years of age. ? You are older than 73 years of age and your health care provider tells you that you are at risk for this type of infection. ? Your sexual activity has changed since you were last screened and you are at an  increased risk for chlamydia or gonorrhea. Ask your health care provider if you are at risk.  If you do not have HIV, but are at risk, it may be recommended that you take a prescription medicine daily to prevent HIV infection. This is called pre-exposure prophylaxis (PrEP). You are considered at risk if: ? You are sexually active and do not regularly use condoms or know the HIV status of your partner(s). ? You take drugs by injection. ? You are sexually active with a partner who has HIV.   Talk with your health care provider about whether you are at high risk of being infected with HIV. If you choose to begin PrEP, you should first be tested for HIV. You should then be tested every 3 months for as long as you are taking PrEP. Pregnancy  If you are premenopausal and you may become pregnant, ask your health care provider about preconception counseling.  If you may become pregnant, take 400 to 800 micrograms (mcg) of folic acid every day.  If you want to prevent pregnancy, talk to your health care provider about birth control (contraception). Osteoporosis and menopause  Osteoporosis is a disease in which the bones lose minerals and strength with aging. This can result in serious bone fractures. Your risk for osteoporosis can be identified using a bone density scan.  If you are 65 years of age or older, or if you are at risk for osteoporosis and fractures, ask your health care provider if you should be screened.  Ask your health care provider whether you should take a calcium or vitamin D supplement to lower your risk for osteoporosis.  Menopause may have certain physical symptoms and risks.  Hormone replacement therapy may reduce some of these symptoms and risks. Talk to your health care provider about whether hormone replacement therapy is right for you. Follow these instructions at home:  Schedule regular health, dental, and eye exams.  Stay current with your immunizations.  Do not use  any tobacco products including cigarettes, chewing tobacco, or electronic cigarettes.  If you are pregnant, do not drink alcohol.  If you are breastfeeding, limit how much and how often you drink alcohol.  Limit alcohol intake to no more than 1 drink per day for nonpregnant women. One drink equals 12 ounces of beer, 5 ounces of wine, or 1 ounces of hard liquor.  Do not use street drugs.  Do not share needles.  Ask your health care provider for help if you need support or information about quitting drugs.  Tell your health care provider if you often feel depressed.  Tell your health care provider if you have ever been abused or do not feel safe at home. This information is not intended to replace advice given to you by your health care provider. Make sure you discuss any questions you have with your health care provider. Document Released: 01/03/2011 Document Revised: 11/26/2015 Document Reviewed: 03/24/2015 Elsevier Interactive Patient Education  2019 [employer] Inc[employer]  

## 2018-07-12 ENCOUNTER — Encounter: Payer: Self-pay | Admitting: Internal Medicine

## 2018-07-23 ENCOUNTER — Ambulatory Visit: Payer: Self-pay | Admitting: *Deleted

## 2018-07-23 NOTE — Telephone Encounter (Signed)
Contacted pt regarding symptoms; the pt states that she was seen in the office 06/27/2019 and diagnosed with bronchitis  (pt given doxycycline x 10 days); she says that 07/19/2018 she stared coughing and having sore throat; today she coughed up yellow-green sputum; the pt would like to have something called in to [facility]; recommendations made per nurse triage protocol; Gareth Eagle verifies that the pt should be seen in the office; pt offered and accepted appointment with Dr Billey Gosling. [facility] 07/24/2018 at 930; she verbalized understanding; will route to office for notification.    Reason for Disposition . SEVERE coughing spells (e.g., whooping sound after coughing, vomiting after coughing)  Answer Assessment - Initial Assessment Questions 1. ONSET: "When did the cough begin?"      07/19/2018 2. SEVERITY: "How bad is the cough today?"      severe 3. RESPIRATORY DISTRESS: "Describe your breathing."      ok 4. FEVER: "Do you have a fever?" If so, ask: "What is your temperature, how was it measured, and when did it start?"     no 5. SPUTUM: "Describe the color of your sputum" (clear, white, yellow, green)     Yellow-green 6. HEMOPTYSIS: "Are you coughing up any blood?" If so ask: "How much?" (flecks, streaks, tablespoons, etc.)     no 7. CARDIAC HISTORY: "Do you have any history of heart disease?" (e.g., heart attack, congestive heart failure)      no 8. LUNG HISTORY: "Do you have any history of lung disease?"  (e.g., pulmonary embolus, asthma, emphysema)     History of asthma and bronchitis 9. PE RISK FACTORS: "Do you have a history of blood clots?" (or: recent major surgery, recent prolonged travel, bedridden)     no 10. OTHER SYMPTOMS: "Do you have any other symptoms?" (e.g., runny nose, wheezing, chest pain)       Sore throat 11. PREGNANCY: "Is there any chance you are pregnant?" "When was your last menstrual period?"       no 12. TRAVEL: "Have you traveled out of the country in  the last month?" (e.g., travel history, exposures)       no  Protocols used: [place]

## 2018-07-23 NOTE — Progress Notes (Signed)
Subjective:    Patient ID: Pamela Rich, female    DOB: 06-May-1946, 73 y.o.   MRN: [id number]  HPI She is here for an acute visit for cold symptoms.    Her symptoms started on 1/16.  She had the same symptoms last month and was on an antibiotic and it helped.  She felt better but not 100% when she finished the antibiotic.  She still feels like there was stuff in her chest.    She is experiencing chills, congestion, headache, lightheadedness, hoarseness, sore throat, sinus pain, cough that is productive at times.  She denies fever, SOB and wheeze.  She has asthma and is using her inhaler.    She has taken flovent, dayquil and otc cold meds  Medications and allergies reviewed with patient and updated if appropriate.  Patient Active Problem List   Diagnosis Date Noted  . Hearing loss, wears hearing aids 06/22/2017  . Radicular pain of left lower extremity 02/28/2017  . Chronic pain of both knees 06/21/2016  . Mild persistent asthma 07/20/2015  . Other allergic rhinitis 07/20/2015  . Mixed hyperlipidemia 03/01/2012  . Migraine without aura 04/05/2010  . FACIAL PARESTHESIA, LEFT 10/16/2009  . Vitamin D deficiency 03/19/2009  . GERD (gastroesophageal reflux disease) 07/08/2008  . DEGENERATIVE JOINT DISEASE 12/05/2007  . NOCTURIA 12/05/2007  . Hypothyroidism 01/22/2007  . IBS 09/28/2006  . Osteopenia 09/28/2006    Current Outpatient Medications on File Prior to Visit  Medication Sig Dispense Refill  . albuterol (PROAIR HFA) 108 (90 Base) MCG/ACT inhaler Inhale 2 puffs into the lungs every 4 (four) hours as needed for wheezing (using 1 puff daily prn). Or coughing spells. 1 Inhaler 1  . Ascorbic Acid (VITAMIN C) 1000 MG tablet Take 1,000 mg by mouth daily.      Marland Kitchen aspirin 81 MG tablet Take 81 mg by mouth daily.    Marland Kitchen azelastine (ASTELIN) 0.1 % nasal spray Use in each nostril as directed 30 mL 0  . Cholecalciferol (VITAMIN D3) 1000 UNITS CAPS Take by mouth daily.      .  fluticasone (FLONASE) 50 MCG/ACT nasal spray USE 2 SPRAYS IN EACH NOSTRIL EVERY DAY FOR STUFFY NOSE OR DRAINAGE 16 g 11  . fluticasone (FLOVENT HFA) 44 MCG/ACT inhaler Inhale 2 puffs into the lungs 2 (two) times daily. 77.8 g 5  . folic acid (FOLVITE) 1 MG tablet Take 1 mg by mouth daily.    Marland Kitchen levothyroxine (SYNTHROID, LEVOTHROID) 50 MCG tablet TAKE 2 TABLETS (100 MCG TOTAL) BY MOUTH DAILY. 180 tablet 0  . Multiple Vitamin (MULTIVITAMIN) tablet Take by mouth.    . pravastatin (PRAVACHOL) 20 MG tablet TAKE 1 TABLET BY MOUTH EVERY DAY 90 tablet 0   No current facility-administered medications on file prior to visit.     Past Medical History:  Diagnosis Date  . Allergy    SEASONAL  . Fibroid    no surgery  . Heart murmur   . Hyperlipidemia   . Hypothyroidism   . Migraines   . Mild intermittent asthma 07/20/2015   pt is currently not taking any medications  . Osteopenia   . Sciatic nerve lesion 2018  . Vitamin D deficiency     Past Surgical History:  Procedure Laterality Date  . COLONOSCOPY  2014   negative , Dr Ardis Hughs  . cortisone injections     in left hand  . DORSAL COMPARTMENT RELEASE Right 03/20/2018   Procedure: RIGHT OPEN  RELEASE DORSAL COMPARTMENT (  DEQUERVAINS);  Surgeon: Daryll Brod, MD;  Location: [facility];  Service: Orthopedics;  Laterality: Right;  . LUMBAR FUSION     Dr Hal Neer  . POLYPECTOMY     uterine  . SEPTOPLASTY    . SINOSCOPY    . TUBAL LIGATION    . WRIST ARTHROSCOPY WITH FOVEAL TRIANGULAR FIBROCARTILAGE COMPLEX REPAIR Right 03/20/2018   Procedure: RIGHT WRIST ARTHROSCOPY WITH DEBRIDEMENT, SCAPHOLUNATE/TRIANGULAR FIBROCARTILAGE COMPLEX REPAIR;  Surgeon: Daryll Brod, MD;  Location: [facility];  Service: Orthopedics;  Laterality: Right;    Social History   Socioeconomic History  . Marital status: Widowed    Spouse name: Not on file  . Number of children: 3  . Years of education: Not on file  . Highest education level:  Not on file  Occupational History  . Occupation: retired  Scientific laboratory technician  . Financial resource strain: Not on file  . Food insecurity:    Worry: Not on file    Inability: Not on file  . Transportation needs:    Medical: Not on file    Non-medical: Not on file  Tobacco Use  . Smoking status: Former Smoker    Packs/day: 0.30    Years: 40.00    Pack years: 12.00    Types: Cigarettes    Last attempt to quit: 07/04/1974    Years since quitting: 44.0  . Smokeless tobacco: Never Used  . Tobacco comment: smoked 1965-1976 , < 3 cig/ day  Substance and Sexual Activity  . Alcohol use: No    Alcohol/week: 0.0 standard drinks    Comment: rarely  . Drug use: No  . Sexual activity: Not Currently    Partners: Male    Birth control/protection: Surgical, Post-menopausal    Comment: BTL  Lifestyle  . Physical activity:    Days per week: Not on file    Minutes per session: Not on file  . Stress: Not on file  Relationships  . Social connections:    Talks on phone: Not on file    Gets together: Not on file    Attends religious service: Not on file    Active member of club or organization: Not on file    Attends meetings of clubs or organizations: Not on file    Relationship status: Not on file  Other Topics Concern  . Not on file  Social History Narrative   Exercise: walking, stretching, weights    Family History  Problem Relation Age of Onset  . Hypertension Mother   . Heart disease Mother        CHF; AS  . Osteoporosis Mother   . Heart attack Mother 72  . Allergic rhinitis Mother   . Diabetes Father   . Stroke Father        onset in 28s  . Cancer Brother        malignant brain tumor  . Colon cancer Neg Hx   . Esophageal cancer Neg Hx   . Rectal cancer Neg Hx   . Stomach cancer Neg Hx   . Angioedema Neg Hx   . Asthma Neg Hx   . Eczema Neg Hx   . Immunodeficiency Neg Hx   . Urticaria Neg Hx     Review of Systems  Constitutional: Positive for chills. Negative for fever.   HENT: Positive for congestion, sinus pain, sneezing, sore throat and voice change (2 months). Negative for ear pain.   Respiratory: Positive for cough (productive at times). Negative for  chest tightness, shortness of breath and wheezing.   Gastrointestinal: Negative for diarrhea and nausea.  Musculoskeletal: Negative for myalgias.  Neurological: Positive for light-headedness and headaches.       Objective:   Vitals:   07/24/18 0919  BP: 122/78  Pulse: 77  Resp: 16  Temp: 97.7 F (36.5 C)  SpO2: 99%   Filed Weights   07/24/18 0919  Weight: 151 lb (68.5 kg)   Body mass index is 26.75 kg/m.  Wt Readings from Last 3 Encounters:  07/24/18 151 lb (68.5 kg)  07/11/18 148 lb (67.1 kg)  06/26/18 152 lb 0.6 oz (69 kg)     Physical Exam GENERAL APPEARANCE: Appears stated age, well appearing, NAD EYES: conjunctiva clear, no icterus HEENT: bilateral tympanic membranes and ear canals normal, oropharynx with mild erythema, no thyromegaly, trachea midline, mild cervical lymphadenopathy LUNGS: Clear to auscultation without wheeze or crackles, unlabored breathing, good air entry bilaterally CARDIOVASCULAR: Normal S1,S2 without murmurs, no edema SKIN: warm, dry        Assessment & Plan:   See Problem List for Assessment and Plan of chronic medical problems.

## 2018-07-24 ENCOUNTER — Encounter: Payer: Self-pay | Admitting: Internal Medicine

## 2018-07-24 ENCOUNTER — Ambulatory Visit (INDEPENDENT_AMBULATORY_CARE_PROVIDER_SITE_OTHER): Payer: Medicare Other | Admitting: Internal Medicine

## 2018-07-24 VITALS — BP 122/78 | HR 77 | Temp 97.7°F | Resp 16 | Ht 63.0 in | Wt 151.0 lb

## 2018-07-24 DIAGNOSIS — J0100 Acute maxillary sinusitis, unspecified: Secondary | ICD-10-CM

## 2018-07-24 MED ORDER — CEFDINIR 300 MG PO CAPS: 300.0000 mg | ORAL_CAPSULE | Freq: Two times a day (BID) | ORAL | 0 refills | Status: DC

## 2018-07-24 NOTE — Assessment & Plan Note (Addendum)
Likely bacterial  Start augmentin otc cold medications Continue inhalers Rest, fluid Call if no improvement

## 2018-07-24 NOTE — Patient Instructions (Signed)
° ° °  Take the antibiotic as prescribed - complete the entire course.   ° ° °Continue over the counter cold medication, advil and tylenol.  Increase your fluids and rest.  ° ° °Call if no improvement   ° °

## 2018-08-24 ENCOUNTER — Ambulatory Visit: Payer: Medicare Other

## 2018-08-30 ENCOUNTER — Ambulatory Visit (INDEPENDENT_AMBULATORY_CARE_PROVIDER_SITE_OTHER): Payer: Medicare Other | Admitting: *Deleted

## 2018-08-30 VITALS — BP 118/62 | HR 70 | Resp 17 | Ht 63.0 in | Wt 151.0 lb

## 2018-08-30 DIAGNOSIS — Z0000 Encounter for general adult medical examination without abnormal findings: Secondary | ICD-10-CM | POA: Diagnosis not present

## 2018-08-30 NOTE — Progress Notes (Signed)
Subjective:   Pamela Rich is a 73 y.o. female who presents for Medicare Annual (Subsequent) preventive examination.  Review of Systems:  No ROS.  Medicare Wellness Visit. Additional risk factors are reflected in the social history.    Sleep patterns: feels rested on waking, gets up 1-2 times nightly to void and sleeps 7-8 hours nightly.    Home Safety/Smoke Alarms: Feels safe in home. Smoke alarms in place.  Living environment; residence and Firearm Safety: [place], can live on one level. Lives alone, no needs for DME, good support system Seat Belt Safety/Bike Helmet: Wears seat belt.    Objective:     Vitals: BP 118/62   Pulse 70   Resp 17   Ht 5\' 3"  (1.6 m)   Wt 151 lb (68.5 kg)   LMP 07/04/2000   SpO2 98%   BMI 26.75 kg/m   Body mass index is 26.75 kg/m.  Advanced Directives 08/30/2018 03/20/2018 03/12/2018 06/21/2016 05/06/2015 05/06/2015  Does Patient Have a Medical Advance Directive? Yes Yes Yes Yes - Yes  Type of Advance Directive [place];Living will [place];Living will [place];Living will Living will;Healthcare Power of Attorney Living will;Healthcare Power of [place]  Does patient want to make changes to medical advance directive? - - No - Patient declined - - -  Copy of [place] in Chart? No - copy requested No - copy requested No - copy requested Yes - Yes    Tobacco Social History   Tobacco Use  Smoking Status Former Smoker  . Packs/day: 0.30  . Years: 40.00  . Pack years: 12.00  . Types: Cigarettes  . Last attempt to quit: 07/04/1974  . Years since quitting: 44.1  Smokeless Tobacco Never Used  Tobacco Comment   smoked 1965-1976 , < 3 cig/ day     Counseling given: Not Answered Comment: smoked 1965-1976 , < 3 cig/ day  Past Medical History:  Diagnosis Date  . Allergy    SEASONAL  . Fibroid    no surgery  . Heart murmur   .  Hyperlipidemia   . Hypothyroidism   . Migraines   . Mild intermittent asthma 07/20/2015   pt is currently not taking any medications  . Osteopenia   . Sciatic nerve lesion 2018  . Vitamin D deficiency    Past Surgical History:  Procedure Laterality Date  . COLONOSCOPY  2014   negative , Dr Ardis Hughs  . cortisone injections     in left hand  . DORSAL COMPARTMENT RELEASE Right 03/20/2018   Procedure: RIGHT OPEN  RELEASE DORSAL COMPARTMENT (DEQUERVAINS);  Surgeon: Daryll Brod, MD;  Location: [facility];  Service: Orthopedics;  Laterality: Right;  . LUMBAR FUSION     Dr Hal Neer  . POLYPECTOMY     uterine  . SEPTOPLASTY    . SINOSCOPY    . TUBAL LIGATION    . WRIST ARTHROSCOPY WITH FOVEAL TRIANGULAR FIBROCARTILAGE COMPLEX REPAIR Right 03/20/2018   Procedure: RIGHT WRIST ARTHROSCOPY WITH DEBRIDEMENT, SCAPHOLUNATE/TRIANGULAR FIBROCARTILAGE COMPLEX REPAIR;  Surgeon: Daryll Brod, MD;  Location: [facility];  Service: Orthopedics;  Laterality: Right;   Family History  Problem Relation Age of Onset  . Hypertension Mother   . Heart disease Mother        CHF; AS  . Osteoporosis Mother   . Heart attack Mother 68  . Allergic rhinitis Mother   . Diabetes Father   .  Stroke Father        onset in 11s  . Cancer Brother        malignant brain tumor  . Colon cancer Neg Hx   . Esophageal cancer Neg Hx   . Rectal cancer Neg Hx   . Stomach cancer Neg Hx   . Angioedema Neg Hx   . Asthma Neg Hx   . Eczema Neg Hx   . Immunodeficiency Neg Hx   . Urticaria Neg Hx    Social History   Socioeconomic History  . Marital status: Widowed    Spouse name: Not on file  . Number of children: 3  . Years of education: Not on file  . Highest education level: Not on file  Occupational History  . Occupation: retired  Scientific laboratory technician  . Financial resource strain: Not hard at all  . Food insecurity:    Worry: Never true    Inability: Never true  . Transportation needs:     Medical: No    Non-medical: No  Tobacco Use  . Smoking status: Former Smoker    Packs/day: 0.30    Years: 40.00    Pack years: 12.00    Types: Cigarettes    Last attempt to quit: 07/04/1974    Years since quitting: 44.1  . Smokeless tobacco: Never Used  . Tobacco comment: smoked 1965-1976 , < 3 cig/ day  Substance and Sexual Activity  . Alcohol use: No    Alcohol/week: 0.0 standard drinks    Comment: rarely  . Drug use: No  . Sexual activity: Not Currently    Partners: Male    Birth control/protection: Surgical, Post-menopausal    Comment: BTL  Lifestyle  . Physical activity:    Days per week: 4 days    Minutes per session: 40 min  . Stress: Not at all  Relationships  . Social connections:    Talks on phone: More than three times a week    Gets together: More than three times a week    Attends religious service: 1 to 4 times per year    Active member of club or organization: Yes    Attends meetings of clubs or organizations: More than 4 times per year    Relationship status: Married  Other Topics Concern  . Not on file  Social History Narrative   Exercise: walking, stretching, weights    Outpatient Encounter Medications as of 08/30/2018  Medication Sig  . albuterol (PROAIR HFA) 108 (90 Base) MCG/ACT inhaler Inhale 2 puffs into the lungs every 4 (four) hours as needed for wheezing (using 1 puff daily prn). Or coughing spells.  . Ascorbic Acid (VITAMIN C) 1000 MG tablet Take 1,000 mg by mouth daily.    Marland Kitchen aspirin 81 MG tablet Take 81 mg by mouth daily.  Marland Kitchen azelastine (ASTELIN) 0.1 % nasal spray Use in each nostril as directed  . Cholecalciferol (VITAMIN D3) 1000 UNITS CAPS Take by mouth daily.    . fluticasone (FLONASE) 50 MCG/ACT nasal spray USE 2 SPRAYS IN EACH NOSTRIL EVERY DAY FOR STUFFY NOSE OR DRAINAGE  . fluticasone (FLOVENT HFA) 44 MCG/ACT inhaler Inhale 2 puffs into the lungs 2 (two) times daily.  . folic acid (FOLVITE) 1 MG tablet Take 1 mg by mouth daily.  Marland Kitchen  levothyroxine (SYNTHROID, LEVOTHROID) 50 MCG tablet TAKE 2 TABLETS (100 MCG TOTAL) BY MOUTH DAILY.  . Multiple Vitamin (MULTIVITAMIN) tablet Take by mouth.  . pravastatin (PRAVACHOL) 20 MG tablet TAKE 1 TABLET BY  MOUTH EVERY DAY  . [DISCONTINUED] cefdinir (OMNICEF) 300 MG capsule Take 1 capsule (300 mg total) by mouth 2 (two) times daily. (Patient not taking: Reported on 08/30/2018)   No facility-administered encounter medications on file as of 08/30/2018.     Activities of Daily Living In your present state of health, do you have any difficulty performing the following activities: 08/30/2018 03/20/2018  Hearing? N Y  Comment - bil hearing aids  Vision? N N  Difficulty concentrating or making decisions? N N  Walking or climbing stairs? N N  Dressing or bathing? N N  Doing errands, shopping? N -  Preparing Food and eating ? N -  Using the Toilet? N -  In the past six months, have you accidently leaked urine? N -  Do you have problems with loss of bowel control? N -  Managing your Medications? N -  Managing your Finances? N -  Housekeeping or managing your Housekeeping? N -  Some recent data might be hidden    Patient Care Team: Binnie Rail, MD as PCP - General (Internal Medicine) Bobbitt, [place] Muta, MD as Consulting Physician (Allergy and Immunology) Megan Salon, MD as Consulting Physician (Gynecology) Danella Sensing, MD as Consulting Physician (Dermatology) Ralene Bathe, MD as Consulting Physician (Ophthalmology)    Assessment:   This is a routine wellness examination for [place]. Physical assessment deferred to PCP.  Exercise Activities and Dietary recommendations Current Exercise Habits: Structured exercise class;Home exercise routine  Diet (meal preparation, eat out, water intake, caffeinated beverages, dairy products, fruits and vegetables): in general, a "healthy" diet  , well balanced eats a variety of fruits and vegetables daily, limits salt, fat/cholesterol,  sugar,carbohydrates,caffeine.  Goals      Patient Stated   . patient (pt-stated)     More strength training with stretch bands, low weights, go slow and stop with pain  Adapting to change is ongoing; will adapt some stress reduction;   Vacation an little more; Go on vacation x 2 weeks;       Other   . Exercise 150 minutes per week (moderate activity)     Increase exercise by joining [employer] at [facility]     Marland Kitchen Patient Stated     I want to increase my physical activity by walking more regularly.        Fall Risk Fall Risk  08/30/2018 07/11/2018 06/22/2017 06/21/2016 05/06/2015  Falls in the past year? 0 1 Yes No No  Number falls in past yr: 0 0 1 - -  Injury with Fall? - - Yes - -    Depression Screen PHQ 2/9 Scores 08/30/2018 07/11/2018 06/22/2017 06/21/2016  PHQ - 2 Score 0 0 0 0     Cognitive Function       Ad8 score reviewed for issues:  Issues making decisions: no  Less interest in hobbies / activities: no  Repeats questions, stories (family complaining): no  Trouble using ordinary gadgets (microwave, computer, phone):no  Forgets the month or year: no  Mismanaging finances: no  Remembering appts: no  Daily problems with thinking and/or memory: no Ad8 score is= 0  Immunization History  Administered Date(s) Administered  . Influenza Split 04/06/2011, 04/18/2012  . Influenza Whole 04/18/2007, 04/23/2008, 04/08/2009, 03/26/2010  . Influenza, High Dose Seasonal PF 04/16/2013, 03/24/2016, 04/04/2017, 04/13/2018  . Influenza,inj,Quad PF,6+ Mos 03/18/2014, 03/24/2015  . Pneumococcal Conjugate-13 11/04/2014  . Pneumococcal Polysaccharide-23 01/22/2007, 11/12/2012  . Tdap 07/26/2012  . Zoster 09/24/2007   Screening Tests Health  Maintenance  Topic Date Due  . DEXA SCAN  11/05/2018  . MAMMOGRAM  01/30/2020  . TETANUS/TDAP  07/26/2022  . COLONOSCOPY  12/26/2022  . INFLUENZA VACCINE  Completed  . Hepatitis C Screening  Completed  . PNA vac Low Risk  Adult  Completed      Plan:    Reviewed health maintenance screenings with patient today and relevant education, vaccines, and/or referrals were provided.   Continue doing brain stimulating activities (puzzles, reading, adult coloring books, staying active) to keep memory sharp.   Continue to eat heart healthy diet (full of fruits, vegetables, whole grains, lean protein, water--limit salt, fat, and sugar intake) and increase physical activity as tolerated.  I have personally reviewed and noted the following in the patient's chart:   . Medical and social history . Use of alcohol, tobacco or illicit drugs  . Current medications and supplements . Functional ability and status . Nutritional status . Physical activity . Advanced directives . List of other physicians . Vitals . Screenings to include cognitive, depression, and falls . Referrals and appointments  In addition, I have reviewed and discussed with patient certain preventive protocols, quality metrics, and best practice recommendations. A written personalized care plan for preventive services as well as general preventive health recommendations were provided to patient.     Michiel Cowboy, RN  08/30/2018

## 2018-08-30 NOTE — Patient Instructions (Addendum)
Continue doing brain stimulating activities (puzzles, reading, adult coloring books, staying active) to keep memory sharp.   Continue to eat heart healthy diet (full of fruits, vegetables, whole grains, lean protein, water--limit salt, fat, and sugar intake) and increase physical activity as tolerated.  Pamela Rich , Thank you for taking time to come for your Medicare Wellness Visit. I appreciate your ongoing commitment to your health goals. Please review the following plan we discussed and let me know if I can assist you in the future.   These are the goals we discussed: Goals      Patient Stated   . patient (pt-stated)     More strength training with stretch bands, low weights, go slow and stop with pain  Adapting to change is ongoing; will adapt some stress reduction;   Vacation an little more; Go on vacation x 2 weeks;       Other   . Exercise 150 minutes per week (moderate activity)     Increase exercise by joining [employer] at [facility].     Marland Kitchen Patient Stated     I want to increase my physical activity by walking more regularly.        This is a list of the screening recommended for you and due dates:  Health Maintenance  Topic Date Due  . DEXA scan (bone density measurement)  11/05/2018  . Mammogram  01/30/2020  . Tetanus Vaccine  07/26/2022  . Colon Cancer Screening  12/26/2022  . Flu Shot  Completed  .  Hepatitis C: One time screening is recommended by Center for Disease Control  (CDC) for  adults born from [birth year] through 1965.   Completed  . Pneumonia vaccines  Completed   Health Maintenance, Female Adopting a healthy lifestyle and getting preventive care can go a long way to promote health and wellness. Talk with your health care provider about what schedule of regular examinations is right for you. This is a good chance for you to check in with your provider about disease prevention and staying healthy. In between checkups, there are plenty of things you can do on  your own. Experts have done a lot of research about which lifestyle changes and preventive measures are most likely to keep you healthy. Ask your health care provider for more information. Weight and diet Eat a healthy diet  Be sure to include plenty of vegetables, fruits, low-fat dairy products, and lean protein.  Do not eat a lot of foods high in solid fats, added sugars, or salt.  Get regular exercise. This is one of the most important things you can do for your health. ? Most adults should exercise for at least 150 minutes each week. The exercise should increase your heart rate and make you sweat (moderate-intensity exercise). ? Most adults should also do strengthening exercises at least twice a week. This is in addition to the moderate-intensity exercise. Maintain a healthy weight  Body mass index (BMI) is a measurement that can be used to identify possible weight problems. It estimates body fat based on height and weight. Your health care provider can help determine your BMI and help you achieve or maintain a healthy weight.  For females 40 years of age and older: ? A BMI below 18.5 is considered underweight. ? A BMI of 18.5 to 24.9 is normal. ? A BMI of 25 to 29.9 is considered overweight. ? A BMI of 30 and above is considered obese. Watch levels of cholesterol and blood lipids  You should start having your blood tested for lipids and cholesterol at 73 years of age, then have this test every 5 years.  You may need to have your cholesterol levels checked more often if: ? Your lipid or cholesterol levels are high. ? You are older than 73 years of age. ? You are at high risk for heart disease. Cancer screening Lung Cancer  Lung cancer screening is recommended for adults 62-60 years old who are at high risk for lung cancer because of a history of smoking.  A yearly low-dose CT scan of the lungs is recommended for people who: ? Currently smoke. ? Have quit within the past 15  years. ? Have at least a 30-pack-year history of smoking. A pack year is smoking an average of one pack of cigarettes a day for 1 year.  Yearly screening should continue until it has been 15 years since you quit.  Yearly screening should stop if you develop a health problem that would prevent you from having lung cancer treatment. Breast Cancer  Practice breast self-awareness. This means understanding how your breasts normally appear and feel.  It also means doing regular breast self-exams. Let your health care provider know about any changes, no matter how small.  If you are in your 20s or 30s, you should have a clinical breast exam (CBE) by a health care provider every 1-3 years as part of a regular health exam.  If you are 31 or older, have a CBE every year. Also consider having a breast X-ray (mammogram) every year.  If you have a family history of breast cancer, talk to your health care provider about genetic screening.  If you are at high risk for breast cancer, talk to your health care provider about having an MRI and a mammogram every year.  Breast cancer gene (BRCA) assessment is recommended for women who have family members with BRCA-related cancers. BRCA-related cancers include: ? Breast. ? Ovarian. ? Tubal. ? Peritoneal cancers.  Results of the assessment will determine the need for genetic counseling and BRCA1 and BRCA2 testing. Cervical Cancer Your health care provider may recommend that you be screened regularly for cancer of the pelvic organs (ovaries, uterus, and vagina). This screening involves a pelvic examination, including checking for microscopic changes to the surface of your cervix (Pap test). You may be encouraged to have this screening done every 3 years, beginning at age 20.  For women ages 81-65, health care providers may recommend pelvic exams and Pap testing every 3 years, or they may recommend the Pap and pelvic exam, combined with testing for human  papilloma virus (HPV), every 5 years. Some types of HPV increase your risk of cervical cancer. Testing for HPV may also be done on women of any age with unclear Pap test results.  Other health care providers may not recommend any screening for nonpregnant women who are considered low risk for pelvic cancer and who do not have symptoms. Ask your health care provider if a screening pelvic exam is right for you.  If you have had past treatment for cervical cancer or a condition that could lead to cancer, you need Pap tests and screening for cancer for at least 20 years after your treatment. If Pap tests have been discontinued, your risk factors (such as having a new sexual partner) need to be reassessed to determine if screening should resume. Some women have medical problems that increase the chance of getting cervical cancer. In these cases, your health  care provider may recommend more frequent screening and Pap tests. Colorectal Cancer  This type of cancer can be detected and often prevented.  Routine colorectal cancer screening usually begins at 73 years of age and continues through 73 years of age.  Your health care provider may recommend screening at an earlier age if you have risk factors for colon cancer.  Your health care provider may also recommend using home test kits to check for hidden blood in the stool.  A small camera at the end of a tube can be used to examine your colon directly (sigmoidoscopy or colonoscopy). This is done to check for the earliest forms of colorectal cancer.  Routine screening usually begins at age 67.  Direct examination of the colon should be repeated every 5-10 years through 73 years of age. However, you may need to be screened more often if early forms of precancerous polyps or small growths are found. Skin Cancer  Check your skin from head to toe regularly.  Tell your health care provider about any new moles or changes in moles, especially if there is a  change in a mole's shape or color.  Also tell your health care provider if you have a mole that is larger than the size of a pencil eraser.  Always use sunscreen. Apply sunscreen liberally and repeatedly throughout the day.  Protect yourself by wearing long sleeves, pants, a wide-brimmed hat, and sunglasses whenever you are outside. Heart disease, diabetes, and high blood pressure  High blood pressure causes heart disease and increases the risk of stroke. High blood pressure is more likely to develop in: ? People who have blood pressure in the high end of the normal range (130-139/85-89 mm Hg). ? People who are overweight or obese. ? People who are African American.  If you are 67-23 years of age, have your blood pressure checked every 3-5 years. If you are 28 years of age or older, have your blood pressure checked every year. You should have your blood pressure measured twice-once when you are at a hospital or clinic, and once when you are not at a hospital or clinic. Record the average of the two measurements. To check your blood pressure when you are not at a hospital or clinic, you can use: ? An automated blood pressure machine at a pharmacy. ? A home blood pressure monitor.  If you are between 75 years and 85 years old, ask your health care provider if you should take aspirin to prevent strokes.  Have regular diabetes screenings. This involves taking a blood sample to check your fasting blood sugar level. ? If you are at a normal weight and have a low risk for diabetes, have this test once every three years after 73 years of age. ? If you are overweight and have a high risk for diabetes, consider being tested at a younger age or more often. Preventing infection Hepatitis B  If you have a higher risk for hepatitis B, you should be screened for this virus. You are considered at high risk for hepatitis B if: ? You were born in a country where hepatitis B is common. Ask your health care  provider which countries are considered high risk. ? Your parents were born in a high-risk country, and you have not been immunized against hepatitis B (hepatitis B vaccine). ? You have HIV or AIDS. ? You use needles to inject street drugs. ? You live with someone who has hepatitis B. ? You have had  sex with someone who has hepatitis B. ? You get hemodialysis treatment. ? You take certain medicines for conditions, including cancer, organ transplantation, and autoimmune conditions. Hepatitis C  Blood testing is recommended for: ? Everyone born from [birth year] through 1965. ? Anyone with known risk factors for hepatitis C. Sexually transmitted infections (STIs)  You should be screened for sexually transmitted infections (STIs) including gonorrhea and chlamydia if: ? You are sexually active and are younger than 73 years of age. ? You are older than 73 years of age and your health care provider tells you that you are at risk for this type of infection. ? Your sexual activity has changed since you were last screened and you are at an increased risk for chlamydia or gonorrhea. Ask your health care provider if you are at risk.  If you do not have HIV, but are at risk, it may be recommended that you take a prescription medicine daily to prevent HIV infection. This is called pre-exposure prophylaxis (PrEP). You are considered at risk if: ? You are sexually active and do not regularly use condoms or know the HIV status of your partner(s). ? You take drugs by injection. ? You are sexually active with a partner who has HIV. Talk with your health care provider about whether you are at high risk of being infected with HIV. If you choose to begin PrEP, you should first be tested for HIV. You should then be tested every 3 months for as long as you are taking PrEP. Pregnancy  If you are premenopausal and you may become pregnant, ask your health care provider about preconception counseling.  If you may become  pregnant, take 400 to 800 micrograms (mcg) of folic acid every day.  If you want to prevent pregnancy, talk to your health care provider about birth control (contraception). Osteoporosis and menopause  Osteoporosis is a disease in which the bones lose minerals and strength with aging. This can result in serious bone fractures. Your risk for osteoporosis can be identified using a bone density scan.  If you are 63 years of age or older, or if you are at risk for osteoporosis and fractures, ask your health care provider if you should be screened.  Ask your health care provider whether you should take a calcium or vitamin D supplement to lower your risk for osteoporosis.  Menopause may have certain physical symptoms and risks.  Hormone replacement therapy may reduce some of these symptoms and risks. Talk to your health care provider about whether hormone replacement therapy is right for you. Follow these instructions at home:  Schedule regular health, dental, and eye exams.  Stay current with your immunizations.  Do not use any tobacco products including cigarettes, chewing tobacco, or electronic cigarettes.  If you are pregnant, do not drink alcohol.  If you are breastfeeding, limit how much and how often you drink alcohol.  Limit alcohol intake to no more than 1 drink per day for nonpregnant women. One drink equals 12 ounces of beer, 5 ounces of , or 1 ounces of hard liquor.  Do not use street drugs.  Do not share needles.  Ask your health care provider for help if you need support or information about quitting drugs.  Tell your health care provider if you often feel depressed.  Tell your health care provider if you have ever been abused or do not feel safe at home. This information is not intended to replace advice given to you by your health  care provider. Make sure you discuss any questions you have with your health care provider. Document Released: 01/03/2011 Document  Revised: 11/26/2015 Document Reviewed: 03/24/2015 Elsevier Interactive Patient Education  2019 [employer].

## 2018-08-31 NOTE — Progress Notes (Signed)
Medical screening examination/treatment/procedure(s) were performed by non-physician practitioner and as supervising physician I was immediately available for consultation/collaboration. I agree with above. Elizabeth A Crawford, MD 

## 2018-09-19 ENCOUNTER — Other Ambulatory Visit: Payer: Self-pay | Admitting: Internal Medicine

## 2018-10-08 ENCOUNTER — Encounter: Payer: Self-pay | Admitting: Internal Medicine

## 2018-10-08 ENCOUNTER — Ambulatory Visit: Payer: Self-pay

## 2018-10-08 ENCOUNTER — Ambulatory Visit (INDEPENDENT_AMBULATORY_CARE_PROVIDER_SITE_OTHER): Payer: Medicare Other | Admitting: Internal Medicine

## 2018-10-08 DIAGNOSIS — R197 Diarrhea, unspecified: Secondary | ICD-10-CM

## 2018-10-08 NOTE — Telephone Encounter (Signed)
Virtual Visit has been made for 4/6 @245p 

## 2018-10-08 NOTE — Assessment & Plan Note (Signed)
Likely viral gastroenteritis Discussed that there are no concerning symptoms such as blood in the stool, nausea, fevers that at this point would warrant further evaluation Her symptoms have improved with changing her diet-bland diet Continue increased fluids Increase probiotics-she is taking a small dose on a daily basis and will increase that Continue bland diet Encouraged her to be patient that I would expect her stools to slowly go back to "normal", but this may take a while She will call with any questions or concerns or if she feels her symptoms are not progressing in the right direction

## 2018-10-08 NOTE — Progress Notes (Signed)
Virtual Visit via Video Note  I connected with Pamela Rich on 10/08/18 at  2:45 PM EDT by a video enabled telemedicine application and verified that I am speaking with the correct person using two identifiers.   I discussed the limitations of evaluation and management by telemedicine and the availability of in person appointments. The patient expressed understanding and agreed to proceed.  History of Present Illness: This is an acute visit for diarrhea.  She states 10 days ago she started having diarrhea.  She thought it may be a virus.  She also had an ulcer on the top of her mouth, fatigue and some headaches.  Just before her symptoms started she was helping her sister move and she stated that she had similar symptoms that had resolved.  Her diarrhea lasted several days.  She did go on a bland diet and had improvement in the diarrhea.  Every time she tries to go back to eating normally the diarrhea returns.  Her fatigue is better.  She is able to do things around the house.  Her diarrhea overall is better-she is just having in the mornings.  She has been trying to eat mostly a brat/bland diet.  She has had some weight loss.  She has had some abdominal cramping prior to having a bowel movement.  She does have some seasonal allergies and has related mild nasal congestion and occasional cough and postnasal drip.  She denies nausea, vomiting, blood in her stool and shortness of breath.  She is drinking plenty of fluids.  She does take probiotics daily, but only takes a teaspoon of liquid probiotics once a day.  She had eggs and English muffin and a half a cup of coffee this morning and she did have 2-3 episodes of diarrhea afterwards.  She ate a baked potato for lunch and has not had diarrhea since eating lunch.  Her big concern was that although her diarrhea has gotten better it just seems to be taking a long time.  Her last antibiotics were in January for sinus infection.  She denies any  travel or unusual food.     Observations/Objective: Appears well.  In no acute distress.  Assessment and Plan:  See Problem List for Assessment and Plan of chronic medical problems.   Follow Up Instructions:    I discussed the assessment and treatment plan with the patient. The patient was provided an opportunity to ask questions and all were answered. The patient agreed with the plan and demonstrated an understanding of the instructions.   The patient was advised to call back or seek an in-person evaluation if the symptoms worsen or if the condition fails to improve as anticipated.    Binnie Rail, MD

## 2018-10-08 NOTE — Telephone Encounter (Signed)
Pt with diarrhea for over 1 week. No fever or abdominal pain.  Pt has tried [employer] and is drinking fluids well. Pt having 3-4 loose stools every morning. Pt's sister was having diarrhea before pt developed her diarrhea. Pt has lost 3 lbs. Pt is drinking and no vomiting reported.  Care advice given and pt verbalized understanding.   Reason for Disposition . [1] MODERATE diarrhea (e.g., 4-6 times / day more than normal) AND [2] age > 70 years  Answer Assessment - Initial Assessment Questions 1. DIARRHEA SEVERITY: "How bad is the diarrhea?" "How many extra stools have you had in the past 24 hours than normal?"    - NO DIARRHEA (SCALE 0)   - MILD (SCALE 1-3): Few loose or mushy BMs; increase of 1-3 stools over normal daily number of stools; mild increase in ostomy output.   -  MODERATE (SCALE 4-7): Increase of 4-6 stools daily over normal; moderate increase in ostomy output. * SEVERE (SCALE 8-10; OR 'WORST POSSIBLE'): Increase of 7 or more stools daily over normal; moderate increase in ostomy output; incontinence.     moderate 2. ONSET: "When did the diarrhea begin? Week ago Saturday 3. BM CONSISTENCY: "How loose or watery is the diarrhea?"      loose 4. VOMITING: "Are you also vomiting?" If so, ask: "How many times in the past 24 hours?"      no 5. ABDOMINAL PAIN: "Are you having any abdominal pain?" If yes: "What does it feel like?" (e.g., crampy, dull, intermittent, constant)      no 6. ABDOMINAL PAIN SEVERITY: If present, ask: "How bad is the pain?"  (e.g., Scale 1-10; mild, moderate, or severe)   - MILD (1-3): doesn't interfere with normal activities, abdomen soft and not tender to touch    - MODERATE (4-7): interferes with normal activities or awakens from sleep, tender to touch    - SEVERE (8-10): excruciating pain, doubled over, unable to do any normal activities       n/a 7. ORAL INTAKE: If vomiting, "Have you been able to drink liquids?" "How much fluids have you had in the past 24  hours?"     n/a 8. HYDRATION: "Any signs of dehydration?" (e.g., dry mouth [not just dry lips], too weak to stand, dizziness, new weight loss) "When did you last urinate?"     This morning- lost 3 lbs 9. EXPOSURE: "Have you traveled to a foreign country recently?" "Have you been exposed to anyone with diarrhea?" "Could you have eaten any food that was spoiled?"     No-sister-no 10. ANTIBIOTIC USE: "Are you taking antibiotics now or have you taken antibiotics in the past 2 months?"       no 11. OTHER SYMPTOMS: "Do you have any other symptoms?" (e.g., fever, blood in stool)       no 12. PREGNANCY: "Is there any chance you are pregnant?" "When was your last menstrual period?"       n/a  Protocols used: DIARRHEA-A-AH

## 2018-11-15 ENCOUNTER — Ambulatory Visit: Payer: Self-pay

## 2018-11-15 ENCOUNTER — Ambulatory Visit: Payer: Medicare Other | Admitting: Orthopedic Surgery

## 2018-11-15 ENCOUNTER — Other Ambulatory Visit: Payer: Self-pay

## 2018-11-15 ENCOUNTER — Encounter: Payer: Self-pay | Admitting: Orthopedic Surgery

## 2018-11-15 VITALS — Ht 63.0 in | Wt 151.0 lb

## 2018-11-15 DIAGNOSIS — M25562 Pain in left knee: Secondary | ICD-10-CM | POA: Diagnosis not present

## 2018-11-15 DIAGNOSIS — G8929 Other chronic pain: Secondary | ICD-10-CM

## 2018-11-15 DIAGNOSIS — M1712 Unilateral primary osteoarthritis, left knee: Secondary | ICD-10-CM

## 2018-11-16 ENCOUNTER — Encounter: Payer: Self-pay | Admitting: Orthopedic Surgery

## 2018-11-16 DIAGNOSIS — G8929 Other chronic pain: Secondary | ICD-10-CM | POA: Diagnosis not present

## 2018-11-16 DIAGNOSIS — M25562 Pain in left knee: Secondary | ICD-10-CM

## 2018-11-16 MED ORDER — METHYLPREDNISOLONE ACETATE 40 MG/ML IJ SUSP
40.0000 mg | INTRAMUSCULAR | Status: AC | PRN
  Administered 2018-11-16: 40 mg via INTRA_ARTICULAR

## 2018-11-16 MED ORDER — LIDOCAINE HCL 1 % IJ SOLN
5.0000 mL | INTRAMUSCULAR | Status: AC | PRN
  Administered 2018-11-16: 5 mL

## 2018-11-16 NOTE — Progress Notes (Signed)
Office Visit Note   Patient: Pamela Rich           Date of Birth: [birth year]           MRN: [id number] Visit Date: 11/15/2018              Requested by: Binnie Rail, MD [place], [place] [id number] PCP: Binnie Rail, MD  Chief Complaint  Patient presents with  . Left Knee - Pain      HPI: The patient is a 73 yo woman who is seen for left knee pain for the past several months. She notes pain when she flexes the left knee when she is climbing into bed and walking up/down stairs. She reports she is normally very active and would like to get back to her walking program, but her left knee is interfering with her walking longer distances. She reports she had a steroid injection to the right knee several years ago and it worked very well. She would like to try a steroid injection on the left today.    Assessment & Plan: Visit Diagnoses:  1. Chronic pain of left knee   2. Unilateral primary osteoarthritis, left knee     Plan: The left knee was injected with steroid under sterile technique and the patient tolerated this well.  She will follow up in about 4 weeks.   Follow-Up Instructions: Return in about 4 weeks (around 12/13/2018).   Ortho Exam  Patient is alert, oriented, no adenopathy, well-dressed, normal affect, normal respiratory effort. Left knee range of motion 0-115 degrees with crepitus over the patella with range of motion. No instability. No signs of infection or cellulitis.  Tenderness over medial and anterior knee to palpation. Neurovascular intact distally.   Imaging: Xr Knee 1-2 Views Left  Result Date: 11/16/2018 Radiographs of the left knee show mild to moderate arthritis changes medial and patello femoral > lateral. Some slight liner lucency about the medial joint. No fractures.   No images are attached to the encounter.  Labs: Lab Results  Component Value Date   LABORGA ESCHERICHIA COLI 02/07/2017     Lab Results  Component Value  Date   ALBUMIN 4.2 07/11/2018   ALBUMIN 4.1 06/22/2017   ALBUMIN 4.1 06/30/2016    Body mass index is 26.75 kg/m.  Orders:  Orders Placed This Encounter  Procedures  . Large Joint Inj: L knee  . XR Knee 1-2 Views Left   No orders of the defined types were placed in this encounter.    Procedures: Large Joint Inj: L knee on 11/16/2018 9:38 AM Indications: pain and diagnostic evaluation Details: 22 G 1.5 in needle, anteromedial approach  Arthrogram: No  Medications: 5 mL lidocaine 1 %; 40 mg methylPREDNISolone acetate 40 MG/ML Outcome: tolerated well, no immediate complications Procedure, treatment alternatives, risks and benefits explained, specific risks discussed. Consent was given by the patient. Immediately prior to procedure a time out was called to verify the correct patient, procedure, equipment, support staff and site/side marked as required. Patient was prepped and draped in the usual sterile fashion.      Clinical Data: No additional findings.  ROS:  All other systems negative, except as noted in the HPI. Review of Systems  Objective: Vital Signs: Ht 5\' 3"  (1.6 m)   Wt 151 lb (68.5 kg)   LMP 07/04/2000   BMI 26.75 kg/m   Specialty Comments:  No specialty comments available.  PMFS History: Patient Active Problem List  Diagnosis Date Noted  . Diarrhea of presumed infectious origin 10/08/2018  . Acute non-recurrent maxillary sinusitis 07/24/2018  . Hearing loss, wears hearing aids 06/22/2017  . Radicular pain of left lower extremity 02/28/2017  . Chronic pain of both knees 06/21/2016  . Mild persistent asthma 07/20/2015  . Other allergic rhinitis 07/20/2015  . Mixed hyperlipidemia 03/01/2012  . Migraine without aura 04/05/2010  . FACIAL PARESTHESIA, LEFT 10/16/2009  . Vitamin D deficiency 03/19/2009  . GERD (gastroesophageal reflux disease) 07/08/2008  . DEGENERATIVE JOINT DISEASE 12/05/2007  . NOCTURIA 12/05/2007  . Hypothyroidism 01/22/2007   . IBS 09/28/2006  . Osteopenia 09/28/2006   Past Medical History:  Diagnosis Date  . Allergy    SEASONAL  . Fibroid    no surgery  . Heart murmur   . Hyperlipidemia   . Hypothyroidism   . Migraines   . Mild intermittent asthma 07/20/2015   pt is currently not taking any medications  . Osteopenia   . Sciatic nerve lesion 2018  . Vitamin D deficiency     Family History  Problem Relation Age of Onset  . Hypertension Mother   . Heart disease Mother        CHF; AS  . Osteoporosis Mother   . Heart attack Mother 50  . Allergic rhinitis Mother   . Diabetes Father   . Stroke Father        onset in 63s  . Cancer Brother        malignant brain tumor  . Colon cancer Neg Hx   . Esophageal cancer Neg Hx   . Rectal cancer Neg Hx   . Stomach cancer Neg Hx   . Angioedema Neg Hx   . Asthma Neg Hx   . Eczema Neg Hx   . Immunodeficiency Neg Hx   . Urticaria Neg Hx     Past Surgical History:  Procedure Laterality Date  . COLONOSCOPY  2014   negative , Dr Ardis Hughs  . cortisone injections     in left hand  . DORSAL COMPARTMENT RELEASE Right 03/20/2018   Procedure: RIGHT OPEN  RELEASE DORSAL COMPARTMENT (DEQUERVAINS);  Surgeon: Daryll Brod, MD;  Location: [facility];  Service: Orthopedics;  Laterality: Right;  . LUMBAR FUSION     Dr Hal Neer  . POLYPECTOMY     uterine  . SEPTOPLASTY    . SINOSCOPY    . TUBAL LIGATION    . WRIST ARTHROSCOPY WITH FOVEAL TRIANGULAR FIBROCARTILAGE COMPLEX REPAIR Right 03/20/2018   Procedure: RIGHT WRIST ARTHROSCOPY WITH DEBRIDEMENT, SCAPHOLUNATE/TRIANGULAR FIBROCARTILAGE COMPLEX REPAIR;  Surgeon: Daryll Brod, MD;  Location: [facility];  Service: Orthopedics;  Laterality: Right;   Social History   Occupational History  . Occupation: retired  Tobacco Use  . Smoking status: Former Smoker    Packs/day: 0.30    Years: 40.00    Pack years: 12.00    Types: Cigarettes    Last attempt to quit: 07/04/1974    Years since  quitting: 44.4  . Smokeless tobacco: Never Used  . Tobacco comment: smoked 1965-1976 , < 3 cig/ day  Substance and Sexual Activity  . Alcohol use: No    Alcohol/week: 0.0 standard drinks    Comment: rarely  . Drug use: No  . Sexual activity: Not Currently    Partners: Male    Birth control/protection: Surgical, Post-menopausal    Comment: BTL

## 2018-12-03 ENCOUNTER — Other Ambulatory Visit: Payer: Self-pay | Admitting: Allergy and Immunology

## 2018-12-14 ENCOUNTER — Telehealth: Payer: Self-pay | Admitting: Internal Medicine

## 2018-12-14 DIAGNOSIS — R197 Diarrhea, unspecified: Secondary | ICD-10-CM

## 2018-12-14 NOTE — Telephone Encounter (Signed)
Last OV was 10/08/18 for diarrhea.

## 2018-12-14 NOTE — Telephone Encounter (Signed)
Patient states she is still having diarrhea since last ov in April.  Patient would like to know what to do.

## 2018-12-14 NOTE — Telephone Encounter (Signed)
She should see GI.  Ideally we should also do stool studies if she is willing to come pick up containers and drop them off at the lab

## 2018-12-14 NOTE — Telephone Encounter (Signed)
Pt states she will see GI. Referral put in.

## 2018-12-26 ENCOUNTER — Ambulatory Visit: Payer: Self-pay | Admitting: *Deleted

## 2018-12-26 ENCOUNTER — Encounter: Payer: Self-pay | Admitting: Internal Medicine

## 2018-12-26 ENCOUNTER — Telehealth: Payer: Self-pay | Admitting: General Practice

## 2018-12-26 ENCOUNTER — Ambulatory Visit (INDEPENDENT_AMBULATORY_CARE_PROVIDER_SITE_OTHER): Payer: Medicare Other | Admitting: Internal Medicine

## 2018-12-26 ENCOUNTER — Other Ambulatory Visit: Payer: Medicare Other

## 2018-12-26 ENCOUNTER — Telehealth: Payer: Self-pay

## 2018-12-26 DIAGNOSIS — Z20822 Contact with and (suspected) exposure to covid-19: Secondary | ICD-10-CM

## 2018-12-26 DIAGNOSIS — R6889 Other general symptoms and signs: Secondary | ICD-10-CM | POA: Diagnosis not present

## 2018-12-26 DIAGNOSIS — Z20828 Contact with and (suspected) exposure to other viral communicable diseases: Secondary | ICD-10-CM | POA: Diagnosis not present

## 2018-12-26 NOTE — Telephone Encounter (Signed)
Pt has fever, diarrhea, and headache. Dr. Quay Burow would like her to get tested for COVID. Call back number is [phone]

## 2018-12-26 NOTE — Telephone Encounter (Signed)
DOXY appointment has been made with Dr.Burns

## 2018-12-26 NOTE — Progress Notes (Signed)
Virtual Visit via Video Note  I connected with Pamela Rich on 12/26/18 at  2:00 PM EDT by a video enabled telemedicine application and verified that I am speaking with the correct person using two identifiers.   I discussed the limitations of evaluation and management by telemedicine and the availability of in person appointments. The patient expressed understanding and agreed to proceed.  The patient is currently at home and I am in the office.    No referring provider.    History of Present Illness: This is an acute visit for possible COVID symptoms.  She has had intermittent diarrhea since March.  She sees GI on July 15th.     She states diarrhea, chills last night, fever (>100), headaches.    She denies cold symptoms, cough that is worse than her usual cough, sob and wheeze.  She has taken tylenol.     She has a sore tooth that has a crown on it.  The soreness has been persistent x 2-3 weeks.  She had a dental appointment yesterday but had to cancel it.    In march she was in [place].  Her son-in-law work in the ED.    Review of Systems  Constitutional: Positive for chills and fever.  HENT: Negative for congestion, ear pain and sore throat.   Respiratory: Positive for cough (mild cough from allergies - no change). Negative for shortness of breath and wheezing.   Gastrointestinal: Positive for diarrhea. Negative for nausea.  Musculoskeletal: Negative for myalgias.  Neurological: Positive for dizziness (a little) and headaches.     Social History   Socioeconomic History  . Marital status: Widowed    Spouse name: Not on file  . Number of children: 3  . Years of education: Not on file  . Highest education level: Not on file  Occupational History  . Occupation: retired  Scientific laboratory technician  . Financial resource strain: Not hard at all  . Food insecurity    Worry: Never true    Inability: Never true  . Transportation needs    Medical: No    Non-medical: No   Tobacco Use  . Smoking status: Former Smoker    Packs/day: 0.30    Years: 40.00    Pack years: 12.00    Types: Cigarettes    Quit date: 07/04/1974    Years since quitting: 44.5  . Smokeless tobacco: Never Used  . Tobacco comment: smoked 1965-1976 , < 3 cig/ day  Substance and Sexual Activity  . Alcohol use: No    Alcohol/week: 0.0 standard drinks    Comment: rarely  . Drug use: No  . Sexual activity: Not Currently    Partners: Male    Birth control/protection: Surgical, Post-menopausal    Comment: BTL  Lifestyle  . Physical activity    Days per week: 4 days    Minutes per session: 40 min  . Stress: Not at all  Relationships  . Social connections    Talks on phone: More than three times a week    Gets together: More than three times a week    Attends religious service: 1 to 4 times per year    Active member of club or organization: Yes    Attends meetings of clubs or organizations: More than 4 times per year    Relationship status: Married  Other Topics Concern  . Not on file  Social History Narrative   Exercise: walking, stretching, weights     Observations/Objective: Appears well  in NAD   Assessment and Plan:  See Problem List for Assessment and Plan of chronic medical problems.   Follow Up Instructions:    I discussed the assessment and treatment plan with the patient. The patient was provided an opportunity to ask questions and all were answered. The patient agreed with the plan and demonstrated an understanding of the instructions.   The patient was advised to call back or seek an in-person evaluation if the symptoms worsen or if the condition fails to improve as anticipated.    Binnie Rail, MD

## 2018-12-26 NOTE — Assessment & Plan Note (Signed)
Having fever, chills, diarrhea, headache Concern for possible COVID Diarrhea has been intermittent for a while - has GI appt Also has sore tooth and has dental appt  May have more than one thing going on, but need to r/o covid Will arrange testing via [facility] Supportive measures discussed for symptoms

## 2018-12-26 NOTE — Telephone Encounter (Signed)
Pt called with having symptoms of diarrhea, and last night chills, fever (100), and headache. She took a tylenol for her headache last night. Still having some diarrhea this morning. Home care advice given to patient to staying hydrated and social distance self from other people and cont to monitor her temperature and avoid over medicating with Tylenol. Advised of having a virtual appointment to get tested. Pt voiced understanding. Notified practice of patient needing an appointment. Call transferred to the practice. Routing to [facility] at [facility].  Reason for Disposition . [1] COVID-19 infection suspected by caller or triager AND [2] mild symptoms (cough, fever, or others) AND [0] no complications or SOB  Answer Assessment - Initial Assessment Questions 1. COVID-19 DIAGNOSIS: "Who made your Coronavirus (COVID-19) diagnosis?" "Was it confirmed by a positive lab test?" If not diagnosed by a HCP, ask "Are there lots of cases (community spread) where you live?" (See public health department website, if unsure)     In the community 2. ONSET: "When did the COVID-19 symptoms start?"      Last night 3. WORST SYMPTOM: "What is your worst symptom?" (e.g., cough, fever, shortness of breath, muscle aches)     diarrhea 4. COUGH: "Do you have a cough?" If so, ask: "How bad is the cough?"       no 5. FEVER: "Do you have a fever?" If so, ask: "What is your temperature, how was it measured, and when did it start?"     Had fever last night up to 100 and today 97, took Tylenol last night 6. RESPIRATORY STATUS: "Describe your breathing?" (e.g., shortness of breath, wheezing, unable to speak)      no 7. BETTER-SAME-WORSE: "Are you getting better, staying the same or getting worse compared to yesterday?"  If getting worse, ask, "In what way?"     Feels better today 8. HIGH RISK DISEASE: "Do you have any chronic medical problems?" (e.g., asthma, heart or lung disease, weak immune system, etc.)    intermittent  asthma 9. PREGNANCY: "Is there any chance you are pregnant?" "When was your last menstrual period?"     no 10. OTHER SYMPTOMS: "Do you have any other symptoms?"  (e.g., chills, fatigue, headache, loss of smell or taste, muscle pain, sore throat)       Last night had chills, headache, diarrhea  Protocols used: CORONAVIRUS (COVID-19) DIAGNOSED OR SUSPECTED-A-AH

## 2018-12-26 NOTE — Telephone Encounter (Signed)
Pt has been scheduled for covid testing  Pt was referred by: Binnie Rail, MD

## 2018-12-28 ENCOUNTER — Other Ambulatory Visit: Payer: Self-pay | Admitting: Allergy and Immunology

## 2018-12-28 NOTE — Telephone Encounter (Signed)
Courtesy refill  

## 2018-12-30 LAB — NOVEL CORONAVIRUS, NAA: SARS-CoV-2, NAA: NOT DETECTED

## 2019-01-01 ENCOUNTER — Other Ambulatory Visit: Payer: Self-pay | Admitting: Allergy and Immunology

## 2019-01-16 ENCOUNTER — Ambulatory Visit: Payer: Medicare Other | Admitting: Gastroenterology

## 2019-01-26 ENCOUNTER — Other Ambulatory Visit: Payer: Self-pay | Admitting: Allergy and Immunology

## 2019-03-01 ENCOUNTER — Other Ambulatory Visit: Payer: Self-pay | Admitting: Internal Medicine

## 2019-03-01 ENCOUNTER — Telehealth: Payer: Self-pay | Admitting: Obstetrics & Gynecology

## 2019-03-01 ENCOUNTER — Other Ambulatory Visit: Payer: Self-pay | Admitting: Obstetrics & Gynecology

## 2019-03-01 DIAGNOSIS — Z1231 Encounter for screening mammogram for malignant neoplasm of breast: Secondary | ICD-10-CM

## 2019-03-01 NOTE — Telephone Encounter (Signed)
Last AEX 01/30/18 Next AEX 06/06/19  Last BMD at [facility] 11/05/15: Mild osteopenia, repeat 4-5 years.   Call returned to patient. Advised as seen above. Patient will plan to repeat BMD next year with [facility], no order needed at this time.   Routing to provider for final review. Patient is agreeable to disposition. Will close encounter.

## 2019-03-01 NOTE — Telephone Encounter (Signed)
Patient needs order sent to [facility] for BMD.

## 2019-03-07 ENCOUNTER — Ambulatory Visit: Payer: Medicare Other

## 2019-03-10 ENCOUNTER — Other Ambulatory Visit: Payer: Self-pay | Admitting: Internal Medicine

## 2019-03-12 ENCOUNTER — Other Ambulatory Visit: Payer: Self-pay | Admitting: Allergy and Immunology

## 2019-03-14 ENCOUNTER — Ambulatory Visit (INDEPENDENT_AMBULATORY_CARE_PROVIDER_SITE_OTHER): Payer: Medicare Other

## 2019-03-14 ENCOUNTER — Other Ambulatory Visit: Payer: Self-pay

## 2019-03-14 DIAGNOSIS — Z23 Encounter for immunization: Secondary | ICD-10-CM | POA: Diagnosis not present

## 2019-03-15 ENCOUNTER — Other Ambulatory Visit: Payer: Self-pay | Admitting: Internal Medicine

## 2019-04-01 ENCOUNTER — Other Ambulatory Visit: Payer: Self-pay

## 2019-04-01 ENCOUNTER — Encounter: Payer: Self-pay | Admitting: Allergy and Immunology

## 2019-04-01 ENCOUNTER — Ambulatory Visit (INDEPENDENT_AMBULATORY_CARE_PROVIDER_SITE_OTHER): Payer: Medicare Other | Admitting: Allergy and Immunology

## 2019-04-01 VITALS — Temp 97.8°F | Resp 16

## 2019-04-01 DIAGNOSIS — I739 Peripheral vascular disease, unspecified: Secondary | ICD-10-CM | POA: Diagnosis not present

## 2019-04-01 DIAGNOSIS — J4530 Mild persistent asthma, uncomplicated: Secondary | ICD-10-CM | POA: Diagnosis not present

## 2019-04-01 DIAGNOSIS — J4520 Mild intermittent asthma, uncomplicated: Secondary | ICD-10-CM | POA: Diagnosis not present

## 2019-04-01 DIAGNOSIS — J3089 Other allergic rhinitis: Secondary | ICD-10-CM

## 2019-04-01 MED ORDER — AZELASTINE HCL 0.1 % NA SOLN: NASAL | 5 refills | Status: DC

## 2019-04-01 MED ORDER — FLOVENT HFA 44 MCG/ACT IN AERO: 2.0000 | INHALATION_SPRAY | Freq: Two times a day (BID) | RESPIRATORY_TRACT | 5 refills | Status: DC

## 2019-04-01 MED ORDER — ALBUTEROL SULFATE HFA 108 (90 BASE) MCG/ACT IN AERS: 2.0000 | INHALATION_SPRAY | RESPIRATORY_TRACT | 1 refills | Status: DC | PRN

## 2019-04-01 MED ORDER — FLUTICASONE PROPIONATE 50 MCG/ACT NA SUSP: 2.0000 | Freq: Every day | NASAL | 5 refills | Status: DC

## 2019-04-01 NOTE — Patient Instructions (Addendum)
Mild intermittent asthma Well-controlled.  Continue albuterol HFA, 1-2 inhalations every 4-6 hours if needed.  During respiratory tract infections or asthma flares, add Flovent 44g 2 inhalations via spacer device 2 times per day until symptoms have returned to baseline.  If subjective and objective measures of pulmonary function remain stable, we will consider stepping down therapy on the next visit.  Other allergic rhinitis Stable.  Continue appropriate allergen avoidance measures, nasal saline irrigation, azelastine nasal spray, and fluticasone nasal spray as needed.   Return in about 5 months (around 09/01/2019), or if symptoms worsen or fail to improve.

## 2019-04-01 NOTE — Assessment & Plan Note (Signed)
Well-controlled.  Continue albuterol HFA, 1-2 inhalations every 4-6 hours if needed.  During respiratory tract infections or asthma flares, add Flovent 44g 2 inhalations via spacer device 2 times per day until symptoms have returned to baseline.  If subjective and objective measures of pulmonary function remain stable, we will consider stepping down therapy on the next visit. 

## 2019-04-01 NOTE — Progress Notes (Signed)
Follow-up Note  RE: Pamela Rich MRN: [id number] DOB: 1945/10/20 Date of Office Visit: 04/01/2019  Primary care provider: Binnie Rail, MD Referring provider: Binnie Rail, MD  History of present illness: Pamela Rich is a 73 y.o. female with asthma and allergic rhinitis presenting today for follow-up.  She was last seen in this clinic in May 2019.  She reports that her asthma has been well controlled, she does not experience limitations in normal daily activities or nocturnal awakenings due to lower respiratory symptoms.  She is currently using azelastine nasal spray and fluticasone nasal spray with guaifenesin as needed.  She reports that her nasal allergy symptoms are well controlled on this regimen.  Assessment and plan: Mild intermittent asthma Well-controlled.  Continue albuterol HFA, 1-2 inhalations every 4-6 hours if needed.  During respiratory tract infections or asthma flares, add Flovent 44g 2 inhalations via spacer device 2 times per day until symptoms have returned to baseline.  If subjective and objective measures of pulmonary function remain stable, we will consider stepping down therapy on the next visit.  Other allergic rhinitis Stable.  Continue appropriate allergen avoidance measures, nasal saline irrigation, azelastine nasal spray, and fluticasone nasal spray as needed.   Meds ordered this encounter  Medications   albuterol (PROAIR HFA) 108 (90 Base) MCG/ACT inhaler    Sig: Inhale 2 puffs into the lungs every 4 (four) hours as needed for wheezing (using 1 puff daily prn). Or coughing spells.    Dispense:  18 g    Refill:  1    Please keep on file for when the patient is ready to pick up.   azelastine (ASTELIN) 0.1 % nasal spray    Sig: Use in each nostril as directed    Dispense:  30 mL    Refill:  5    Please keep on file for when the patient is ready to pick up.   fluticasone (FLONASE) 50 MCG/ACT nasal spray    Sig: Place 2 sprays  into both nostrils daily.    Dispense:  16 mL    Refill:  5    Please keep on file for when the patient is ready to pick up.   fluticasone (FLOVENT HFA) 44 MCG/ACT inhaler    Sig: Inhale 2 puffs into the lungs 2 (two) times daily.    Dispense:  10.6 g    Refill:  5    Please keep on file for when the patient is ready to pick up.    Diagnostics: Spirometry:   FEV1 of 1.64 L (79% predicted) with an FEV1 ratio of 104%. This study was performed while the patient was asymptomatic.  Please see scanned spirometry results for details.    Physical examination: Temperature 97.8 F (36.6 C), temperature source Temporal, resp. rate 16, last menstrual period 07/04/2000, SpO2 94 %.  General: Alert, interactive, in no acute distress. HEENT: TMs pearly gray, turbinates mildly edematous without discharge, post-pharynx unremarkable. Neck: Supple without lymphadenopathy. Lungs: Clear to auscultation without wheezing, rhonchi or rales. CV: Normal S1, S2 without murmurs. Skin: Warm and dry, without lesions or rashes.  The following portions of the patient's history were reviewed and updated as appropriate: allergies, current medications, past family history, past medical history, past social history, past surgical history and problem list.  Allergies as of 04/01/2019      Reactions   Pneumococcal Vaccines    Area weakness, redness, and swelling       Medication List  Accurate as of April 01, 2019  9:43 PM. If you have any questions, ask your nurse or doctor.        albuterol 108 (90 Base) MCG/ACT inhaler Commonly known as: ProAir HFA Inhale 2 puffs into the lungs every 4 (four) hours as needed for wheezing (using 1 puff daily prn). Or coughing spells.   aspirin 81 MG tablet Take 81 mg by mouth daily.   azelastine 0.1 % nasal spray Commonly known as: ASTELIN Use in each nostril as directed   Flovent HFA 44 MCG/ACT inhaler Generic drug: fluticasone Inhale 2 puffs into the  lungs 2 (two) times daily.   fluticasone 50 MCG/ACT nasal spray Commonly known as: FLONASE Place 2 sprays into both nostrils daily. What changed: See the new instructions. Changed by: Edmonia Lynch, MD   folic acid 1 MG tablet Commonly known as: FOLVITE Take 1 mg by mouth daily.   levothyroxine 50 MCG tablet Commonly known as: SYNTHROID TAKE 2 TABLETS (100 MCG TOTAL) BY MOUTH DAILY.   multivitamin tablet Take by mouth.   pravastatin 20 MG tablet Commonly known as: PRAVACHOL TAKE 1 TABLET BY MOUTH EVERY DAY   vitamin C 1000 MG tablet Take 1,000 mg by mouth daily.   Vitamin D3 25 MCG (1000 UT) Caps Take by mouth daily.       Allergies  Allergen Reactions   Pneumococcal Vaccines     Area weakness, redness, and swelling    Review of systems: Review of systems negative except as noted in HPI / PMHx or noted below: Constitutional: Negative.  HENT: Negative.   Eyes: Negative.  Respiratory: Negative.   Cardiovascular: Negative.  Gastrointestinal: Negative.  Genitourinary: Negative.  Musculoskeletal: Negative.  Neurological: Negative.  Endo/Heme/Allergies: Negative.  Cutaneous: Negative.  Past Medical History:  Diagnosis Date   Allergy    SEASONAL   Fibroid    no surgery   Heart murmur    Hyperlipidemia    Hypothyroidism    Migraines    Mild intermittent asthma 07/20/2015   pt is currently not taking any medications   Osteopenia    Sciatic nerve lesion 2018   Vitamin D deficiency     Family History  Problem Relation Age of Onset   Hypertension Mother    Heart disease Mother        CHF; AS   Osteoporosis Mother    Heart attack Mother 66   Allergic rhinitis Mother    Diabetes Father    Stroke Father        onset in 31s   Cancer Brother        malignant brain tumor   Colon cancer Neg Hx    Esophageal cancer Neg Hx    Rectal cancer Neg Hx    Stomach cancer Neg Hx    Angioedema Neg Hx    Asthma Neg Hx    Eczema Neg  Hx    Immunodeficiency Neg Hx    Urticaria Neg Hx     Social History   Socioeconomic History   Marital status: Widowed    Spouse name: Not on file   Number of children: 3   Years of education: Not on file   Highest education level: Not on file  Occupational History   Occupation: retired  Scientist, 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 all   Food insecurity    Worry: Never true    Inability: Never true   Transportation needs    Medical: No  Non-medical: No  Tobacco Use   Smoking status: Former Smoker    Packs/day: 0.30    Years: 40.00    Pack years: 12.00    Types: Cigarettes    Quit date: 07/04/1974    Years since quitting: 44.7   Smokeless tobacco: Never Used   Tobacco comment: smoked 1965-1976 , < 3 cig/ day  Substance and Sexual Activity   Alcohol use: No    Alcohol/week: 0.0 standard drinks    Comment: rarely   Drug use: No   Sexual activity: Not Currently    Partners: Male    Birth control/protection: Surgical, Post-menopausal    Comment: BTL  Lifestyle   Physical activity    Days per week: 4 days    Minutes per session: 40 min   Stress: Not at all  Relationships   Social connections    Talks on phone: More than three times a week    Gets together: More than three times a week    Attends religious service: 1 to 4 times per year    Active member of club or organization: Yes    Attends meetings of clubs or organizations: More than 4 times per year    Relationship status: Married   Intimate partner violence    Fear of current or ex partner: Not on file    Emotionally abused: Not on file    Physically abused: Not on file    Forced sexual activity: Not on file  Other Topics Concern   Not on file  Social History Narrative   Exercise: walking, stretching, weights    I appreciate the opportunity to take part in [place] care. Please do not hesitate to contact me with questions.  Sincerely,   R. Edgar Frisk, MD

## 2019-04-01 NOTE — Assessment & Plan Note (Signed)
Stable.  Continue appropriate allergen avoidance measures, nasal saline irrigation, azelastine nasal spray, and fluticasone nasal spray as needed.

## 2019-04-16 ENCOUNTER — Other Ambulatory Visit: Payer: Self-pay

## 2019-04-16 ENCOUNTER — Ambulatory Visit
Admission: RE | Admit: 2019-04-16 | Discharge: 2019-04-16 | Disposition: A | Discharge status: Home / Self Care | Payer: Medicare Other | Source: Ambulatory Visit | Attending: Obstetrics & Gynecology | Admitting: Obstetrics & Gynecology

## 2019-04-16 DIAGNOSIS — Z1231 Encounter for screening mammogram for malignant neoplasm of breast: Secondary | ICD-10-CM

## 2019-06-04 ENCOUNTER — Other Ambulatory Visit: Payer: Self-pay

## 2019-06-06 ENCOUNTER — Ambulatory Visit (INDEPENDENT_AMBULATORY_CARE_PROVIDER_SITE_OTHER): Payer: Medicare Other | Admitting: Obstetrics & Gynecology

## 2019-06-06 ENCOUNTER — Other Ambulatory Visit: Payer: Self-pay

## 2019-06-06 ENCOUNTER — Encounter: Payer: Self-pay | Admitting: Obstetrics & Gynecology

## 2019-06-06 VITALS — BP 130/78 | HR 72 | Temp 97.3°F | Resp 12 | Ht 63.0 in | Wt 150.0 lb

## 2019-06-06 DIAGNOSIS — Z01419 Encounter for gynecological examination (general) (routine) without abnormal findings: Secondary | ICD-10-CM

## 2019-06-06 NOTE — Progress Notes (Signed)
73 y.o. G4P3 Widowed White or Caucasian female here for annual exam.  Reports hot flashes have finally gotten better.  Denies vaginal bleeding.   Reports she had a dishwasher leak and had to have flooring in kitchen and dining room removed.  She also had to have oral surgery this year.  Had a cracked crown.  Dental work is finally done.  PCP:  Dr. Quay Burow.  Last appt was 07/11/2018.    Patient's last menstrual period was 07/04/2000.          Sexually active: No.  The current method of family planning is post menopausal status.    Exercising: Yes.    walking Smoker:  no  Health Maintenance: Pap:   01/30/18 Neg  07/28/15 neg              04/02/13 Neg  History of abnormal Pap:  no MMG:  04/16/19 BIRADS 1 negative/density b Colonoscopy:  12/25/12 normal. F/u 10 years  BMD:   11/05/15 Osteopenia, -1.3 worse measurment TDaP:  07/26/12 Pneumonia vaccine(s):  completed Shingrix:   2009.  Does not want Shingrix.   Hep C testing: 06/04/15 Neg Screening Labs: PCP   reports that she quit smoking about 44 years ago. Her smoking use included cigarettes. She has a 12.00 pack-year smoking history. She has never used smokeless tobacco. She reports that she does not drink alcohol or use drugs.  Past Medical History:  Diagnosis Date  . Allergy    SEASONAL  . Fibroid    no surgery  . Heart murmur   . Hyperlipidemia   . Hypothyroidism   . Migraines   . Mild intermittent asthma 07/20/2015   pt is currently not taking any medications  . Osteopenia   . Sciatic nerve lesion 2018  . Vitamin D deficiency     Past Surgical History:  Procedure Laterality Date  . COLONOSCOPY  2014   negative , Dr Ardis Hughs  . cortisone injections     in left hand  . DENTAL SURGERY    . DORSAL COMPARTMENT RELEASE Right 03/20/2018   Procedure: RIGHT OPEN  RELEASE DORSAL COMPARTMENT (DEQUERVAINS);  Surgeon: Daryll Brod, MD;  Location: [facility];  Service: Orthopedics;  Laterality: Right;  . LUMBAR FUSION     Dr  Hal Neer  . POLYPECTOMY     uterine  . SEPTOPLASTY    . SINOSCOPY    . TUBAL LIGATION    . WRIST ARTHROSCOPY WITH FOVEAL TRIANGULAR FIBROCARTILAGE COMPLEX REPAIR Right 03/20/2018   Procedure: RIGHT WRIST ARTHROSCOPY WITH DEBRIDEMENT, SCAPHOLUNATE/TRIANGULAR FIBROCARTILAGE COMPLEX REPAIR;  Surgeon: Daryll Brod, MD;  Location: [facility];  Service: Orthopedics;  Laterality: Right;    Current Outpatient Medications  Medication Sig Dispense Refill  . albuterol (PROAIR HFA) 108 (90 Base) MCG/ACT inhaler Inhale 2 puffs into the lungs every 4 (four) hours as needed for wheezing (using 1 puff daily prn). Or coughing spells. 18 g 1  . Ascorbic Acid (VITAMIN C) 1000 MG tablet Take 1,000 mg by mouth daily.      Marland Kitchen aspirin 81 MG tablet Take 81 mg by mouth daily.    Marland Kitchen azelastine (ASTELIN) 0.1 % nasal spray Use in each nostril as directed 30 mL 5  . Cholecalciferol (VITAMIN D3) 1000 UNITS CAPS Take by mouth daily.      . fluticasone (FLONASE) 50 MCG/ACT nasal spray Place 2 sprays into both nostrils daily. 16 mL 5  . fluticasone (FLOVENT HFA) 44 MCG/ACT inhaler Inhale 2 puffs into  the lungs 2 (two) times daily. A999333 g 5  . folic acid (FOLVITE) 1 MG tablet Take 1 mg by mouth daily.    Marland Kitchen levothyroxine (SYNTHROID) 50 MCG tablet TAKE 2 TABLETS (100 MCG TOTAL) BY MOUTH DAILY. 180 tablet 1  . Multiple Vitamin (MULTIVITAMIN) tablet Take by mouth.    . pravastatin (PRAVACHOL) 20 MG tablet TAKE 1 TABLET BY MOUTH EVERY DAY 90 tablet 1   No current facility-administered medications for this visit.     Family History  Problem Relation Age of Onset  . Hypertension Mother   . Heart disease Mother        CHF; AS  . Osteoporosis Mother   . Heart attack Mother 37  . Allergic rhinitis Mother   . Diabetes Father   . Stroke Father        onset in 87s  . Cancer Brother        malignant brain tumor  . Colon cancer Neg Hx   . Esophageal cancer Neg Hx   . Rectal cancer Neg Hx   . Stomach cancer Neg  Hx   . Angioedema Neg Hx   . Asthma Neg Hx   . Eczema Neg Hx   . Immunodeficiency Neg Hx   . Urticaria Neg Hx   . Breast cancer Neg Hx     Review of Systems  All other systems reviewed and are negative.   Exam:   BP 130/78 (BP Location: Right Arm, Patient Position: Sitting, Cuff Size: Normal)   Pulse 72   Temp (!) 97.3 F (36.3 C) (Temporal)   Resp 12   Ht 5\' 3"  (1.6 m)   Wt 150 lb (68 kg)   LMP 07/04/2000   BMI 26.57 kg/m   Height: 5\' 3"  (160 cm)  Ht Readings from Last 3 Encounters:  06/06/19 5\' 3"  (1.6 m)  11/15/18 5\' 3"  (1.6 m)  08/30/18 5\' 3"  (1.6 m)    General appearance: alert, cooperative and appears stated age Head: Normocephalic, without obvious abnormality, atraumatic Neck: no adenopathy, supple, symmetrical, trachea midline and thyroid normal to inspection and palpation Breasts: normal appearance, no masses or tenderness Abdomen: soft, non-tender; bowel sounds normal; no masses,  no organomegaly Extremities: extremities normal, atraumatic, no cyanosis or edema Skin: Skin color, texture, turgor normal. No rashes or lesions Lymph nodes: Cervical, supraclavicular, and axillary nodes normal. No abnormal inguinal nodes palpated Neurologic: Grossly normal  Pelvic: External genitalia:  no lesions              Urethra:  normal appearing urethra with no masses, tenderness or lesions              Bartholins and Skenes: normal                 Vagina: normal appearing vagina with normal color and discharge, no lesions              Cervix: no lesions              Pap taken: No. Bimanual Exam:  Uterus:  normal size, contour, position, consistency, mobility, non-tender              Adnexa: normal adnexa and no mass, fullness, tenderness               Rectovaginal: Confirms               Anus:  normal sphincter tone, no lesions  Chaperone was present for exam.  A:  Well Woman with normal exam PMP, no HRT Hot flashes after stopping HRT, much improved this  year Hypothyroidism Hyperlipidemia Mild osteopenia  P:   Mammogram guidelines reviewed pap smear not indicated today BMD due in 2022 Declines shingrix vaccination at this time Blood work done 07/2018 with Dr. Quay Burow Colonoscopy done 2014 Return 1-2 years or prn

## 2019-07-18 NOTE — Progress Notes (Signed)
Subjective:    Patient ID: Pamela Rich, female    DOB: 1945-09-19, 74 y.o.   MRN: [id number]  HPI She is here for a physical exam.   She denies any major changes in her health.  She does live alone and states it has been hard with the pandemic because she feels very isolated.  She has some mild anxiety and depression related to that.  She is walking daily.  She has plenty of projects around the house to keep her busy.  Medications and allergies reviewed with patient and updated if appropriate.  Patient Active Problem List   Diagnosis Date Noted  . PAD (peripheral artery disease) ([place]) 04/01/2019  . Suspected COVID-19 virus infection 12/26/2018  . Diarrhea of presumed infectious origin 10/08/2018  . De Quervain's syndrome (tenosynovitis) 03/12/2018  . Hearing loss, wears hearing aids 06/22/2017  . Radicular pain of left lower extremity 02/28/2017  . Chronic pain of both knees 06/21/2016  . Mild intermittent asthma 07/20/2015  . Other allergic rhinitis 07/20/2015  . Mixed hyperlipidemia 03/01/2012  . Migraine without aura 04/05/2010  . FACIAL PARESTHESIA, LEFT 10/16/2009  . Vitamin D deficiency 03/19/2009  . GERD (gastroesophageal reflux disease) 07/08/2008  . DEGENERATIVE JOINT DISEASE 12/05/2007  . NOCTURIA 12/05/2007  . Hypothyroidism 01/22/2007  . IBS 09/28/2006  . Osteopenia 09/28/2006    Current Outpatient Medications on File Prior to Visit  Medication Sig Dispense Refill  . albuterol (PROAIR HFA) 108 (90 Base) MCG/ACT inhaler Inhale 2 puffs into the lungs every 4 (four) hours as needed for wheezing (using 1 puff daily prn). Or coughing spells. 18 g 1  . Ascorbic Acid (VITAMIN C) 1000 MG tablet Take 1,000 mg by mouth daily.      Marland Kitchen aspirin 81 MG tablet Take 81 mg by mouth daily.    Marland Kitchen azelastine (ASTELIN) 0.1 % nasal spray Use in each nostril as directed 30 mL 5  . Cholecalciferol (VITAMIN D3) 1000 UNITS CAPS Take by mouth daily.      . fluticasone (FLONASE) 50  MCG/ACT nasal spray Place 2 sprays into both nostrils daily. 16 mL 5  . fluticasone (FLOVENT HFA) 44 MCG/ACT inhaler Inhale 2 puffs into the lungs 2 (two) times daily. A999333 g 5  . folic acid (FOLVITE) 1 MG tablet Take 1 mg by mouth daily.    Marland Kitchen levothyroxine (SYNTHROID) 50 MCG tablet TAKE 2 TABLETS (100 MCG TOTAL) BY MOUTH DAILY. 180 tablet 1  . Multiple Vitamin (MULTIVITAMIN) tablet Take by mouth.    . pravastatin (PRAVACHOL) 20 MG tablet TAKE 1 TABLET BY MOUTH EVERY DAY 90 tablet 1   No current facility-administered medications on file prior to visit.    Past Medical History:  Diagnosis Date  . Allergy    SEASONAL  . Fibroid    no surgery  . Heart murmur   . Hyperlipidemia   . Hypothyroidism   . Migraines   . Mild intermittent asthma 07/20/2015   pt is currently not taking any medications  . Osteopenia   . Sciatic nerve lesion 2018  . Vitamin D deficiency     Past Surgical History:  Procedure Laterality Date  . cortisone injections     in left hand  . DENTAL SURGERY    . DORSAL COMPARTMENT RELEASE Right 03/20/2018   Procedure: RIGHT OPEN  RELEASE DORSAL COMPARTMENT (DEQUERVAINS);  Surgeon: Daryll Brod, MD;  Location: [facility];  Service: Orthopedics;  Laterality: Right;  . LUMBAR FUSION  Dr Hal Neer  . POLYPECTOMY     uterine  . SEPTOPLASTY    . SINOSCOPY    . TUBAL LIGATION    . WRIST ARTHROSCOPY WITH FOVEAL TRIANGULAR FIBROCARTILAGE COMPLEX REPAIR Right 03/20/2018   Procedure: RIGHT WRIST ARTHROSCOPY WITH DEBRIDEMENT, SCAPHOLUNATE/TRIANGULAR FIBROCARTILAGE COMPLEX REPAIR;  Surgeon: Daryll Brod, MD;  Location: [facility];  Service: Orthopedics;  Laterality: Right;    Social History   Socioeconomic History  . Marital status: Widowed    Spouse name: Not on file  . Number of children: 3  . Years of education: Not on file  . Highest education level: Not on file  Occupational History  . Occupation: retired  Tobacco Use  . Smoking  status: Former Smoker    Packs/day: 0.30    Years: 40.00    Pack years: 12.00    Types: Cigarettes    Quit date: 07/04/1974    Years since quitting: 45.0  . Smokeless tobacco: Never Used  . Tobacco comment: smoked 1965-1976 , < 3 cig/ day  Substance and Sexual Activity  . Alcohol use: No    Alcohol/week: 0.0 standard drinks    Comment: rarely  . Drug use: No  . Sexual activity: Not Currently    Partners: Male    Birth control/protection: Surgical, Post-menopausal    Comment: BTL  Other Topics Concern  . Not on file  Social History Narrative   Exercise: walking, stretching, weights   Social Determinants of Health   Financial Resource Strain: Low Risk   . Difficulty of Paying Living Expenses: Not hard at all  Food Insecurity: No Food Insecurity  . Worried About Charity fundraiser in the Last Year: Never true  . Ran Out of Food in the Last Year: Never true  Transportation Needs: No Transportation Needs  . Lack of Transportation (Medical): No  . Lack of Transportation (Non-Medical): No  Physical Activity: Sufficiently Active  . Days of Exercise per Week: 4 days  . Minutes of Exercise per Session: 40 min  Stress: No Stress Concern Present  . Feeling of Stress : Not at all  Social Connections: Not Isolated  . Frequency of Communication with Friends and Family: More than three times a week  . Frequency of Social Gatherings with Friends and Family: More than three times a week  . Attends Religious Services: 1 to 4 times per year  . Active Member of Clubs or Organizations: Yes  . Attends Archivist Meetings: More than 4 times per year  . Marital Status: Married    Family History  Problem Relation Age of Onset  . Hypertension Mother   . Heart disease Mother        CHF; AS  . Osteoporosis Mother   . Heart attack Mother 66  . Allergic rhinitis Mother   . Diabetes Father   . Stroke Father        onset in 49s  . Cancer Brother        malignant brain tumor  .  Colon cancer Neg Hx   . Esophageal cancer Neg Hx   . Rectal cancer Neg Hx   . Stomach cancer Neg Hx   . Angioedema Neg Hx   . Asthma Neg Hx   . Eczema Neg Hx   . Immunodeficiency Neg Hx   . Urticaria Neg Hx   . Breast cancer Neg Hx     Review of Systems  Constitutional: Negative for chills and fever.  HENT:  Recurrent pain in right pain  Eyes: Negative for visual disturbance.  Respiratory: Positive for wheezing (occ after walking - ? related to mold/dampness). Negative for cough and shortness of breath.   Cardiovascular: Negative for chest pain, palpitations and leg swelling.  Gastrointestinal: Negative for abdominal pain, blood in stool, constipation, diarrhea and nausea.       Occ gerd  Genitourinary: Negative for dysuria and hematuria.  Musculoskeletal: Negative for arthralgias and back pain.  Skin: Negative for color change and Rich.  Neurological: Positive for headaches (occ). Negative for dizziness and light-headedness.  Psychiatric/Behavioral: Positive for dysphoric mood (a little related to pandemic). The patient is nervous/anxious (a little related to pandemic).        Objective:   Vitals:   07/19/19 1055  BP: (!) 144/82  Pulse: 72  Resp: 16  Temp: 98.4 F (36.9 C)  SpO2: 99%   Filed Weights   07/19/19 1055  Weight: 153 lb (69.4 kg)   Body mass index is 27.1 kg/m.  BP Readings from Last 3 Encounters:  07/19/19 (!) 144/82  06/06/19 130/78  08/30/18 118/62    Wt Readings from Last 3 Encounters:  07/19/19 153 lb (69.4 kg)  06/06/19 150 lb (68 kg)  11/15/18 151 lb (68.5 kg)     Physical Exam Constitutional: She appears well-developed and well-nourished. No distress.  HENT:  Head: Normocephalic and atraumatic.  Right Ear: External ear normal. Normal ear canal and TM Left Ear: External ear normal.  Normal ear canal and TM Mouth/Throat: Oropharynx is clear and moist.  Eyes: Conjunctivae and EOM are normal.  Neck: Neck supple. No tracheal  deviation present. No thyromegaly present.  No carotid bruit  Cardiovascular: Normal rate, regular rhythm and normal heart sounds.   No murmur heard.  No edema. Pulmonary/Chest: Effort normal and breath sounds normal. No respiratory distress. She has no wheezes. She has no rales.  Breast: deferred   Abdominal: Soft. She exhibits no distension. There is no tenderness.  Lymphadenopathy: She has no cervical adenopathy.  Skin: Skin is warm and dry. She is not diaphoretic.  Psychiatric: She has a normal mood and affect. Her behavior is normal.        Assessment & Plan:   Physical exam: Screening blood work    ordered Immunizations  Up to date, discussed shingrix Colonoscopy  Up to date  Mammogram   Up to date  Gyn  Up to date  Dexa   Due - breast center - ordered Eye exams  Up to date  Exercise  Walks daily Weight   Normal BMI Substance abuse  none  See Problem List for Assessment and Plan of chronic medical problems.    This visit occurred during the SARS-CoV-2 public health emergency.  Safety protocols were in place, including screening questions prior to the visit, additional usage of staff PPE, and extensive cleaning of exam room while observing appropriate contact time as indicated for disinfecting solutions.

## 2019-07-18 NOTE — Patient Instructions (Addendum)
Blood work was ordered.    All other Health Maintenance issues reviewed.   All recommended immunizations and age-appropriate screenings are up-to-date or discussed.  No immunization administered today.   Medications reviewed and updated.  Changes include :   none     Please followup in 1 year    Health Maintenance, Female Adopting a healthy lifestyle and getting preventive care are important in promoting health and wellness. Ask your health care provider about:  The right schedule for you to have regular tests and exams.  Things you can do on your own to prevent diseases and keep yourself healthy. What should I know about diet, weight, and exercise? Eat a healthy diet   Eat a diet that includes plenty of vegetables, fruits, low-fat dairy products, and lean protein.  Do not eat a lot of foods that are high in solid fats, added sugars, or sodium. Maintain a healthy weight Body mass index (BMI) is used to identify weight problems. It estimates body fat based on height and weight. Your health care provider can help determine your BMI and help you achieve or maintain a healthy weight. Get regular exercise Get regular exercise. This is one of the most important things you can do for your health. Most adults should:  Exercise for at least 150 minutes each week. The exercise should increase your heart rate and make you sweat (moderate-intensity exercise).  Do strengthening exercises at least twice a week. This is in addition to the moderate-intensity exercise.  Spend less time sitting. Even light physical activity can be beneficial. Watch cholesterol and blood lipids Have your blood tested for lipids and cholesterol at 74 years of age, then have this test every 5 years. Have your cholesterol levels checked more often if:  Your lipid or cholesterol levels are high.  You are older than 74 years of age.  You are at high risk for heart disease. What should I know about cancer  screening? Depending on your health history and family history, you may need to have cancer screening at various ages. This may include screening for:  Breast cancer.  Cervical cancer.  Colorectal cancer.  Skin cancer.  Lung cancer. What should I know about heart disease, diabetes, and high blood pressure? Blood pressure and heart disease  High blood pressure causes heart disease and increases the risk of stroke. This is more likely to develop in people who have high blood pressure readings, are of African descent, or are overweight.  Have your blood pressure checked: ? Every 3-5 years if you are 18-39 years of age. ? Every year if you are 40 years old or older. Diabetes Have regular diabetes screenings. This checks your fasting blood sugar level. Have the screening done:  Once every three years after age 40 if you are at a normal weight and have a low risk for diabetes.  More often and at a younger age if you are overweight or have a high risk for diabetes. What should I know about preventing infection? Hepatitis B If you have a higher risk for hepatitis B, you should be screened for this virus. Talk with your health care provider to find out if you are at risk for hepatitis B infection. Hepatitis C Testing is recommended for:  Everyone born from 1945 through 1965.  Anyone with known risk factors for hepatitis C. Sexually transmitted infections (STIs)  Get screened for STIs, including gonorrhea and chlamydia, if: ? You are sexually active and are younger than 74 years   of age. ? You are older than 74 years of age and your health care provider tells you that you are at risk for this type of infection. ? Your sexual activity has changed since you were last screened, and you are at increased risk for chlamydia or gonorrhea. Ask your health care provider if you are at risk.  Ask your health care provider about whether you are at high risk for HIV. Your health care provider may  recommend a prescription medicine to help prevent HIV infection. If you choose to take medicine to prevent HIV, you should first get tested for HIV. You should then be tested every 3 months for as long as you are taking the medicine. Pregnancy  If you are about to stop having your period (premenopausal) and you may become pregnant, seek counseling before you get pregnant.  Take 400 to 800 micrograms (mcg) of folic acid every day if you become pregnant.  Ask for birth control (contraception) if you want to prevent pregnancy. Osteoporosis and menopause Osteoporosis is a disease in which the bones lose minerals and strength with aging. This can result in bone fractures. If you are 65 years old or older, or if you are at risk for osteoporosis and fractures, ask your health care provider if you should:  Be screened for bone loss.  Take a calcium or vitamin D supplement to lower your risk of fractures.  Be given hormone replacement therapy (HRT) to treat symptoms of menopause. Follow these instructions at home: Lifestyle  Do not use any products that contain nicotine or tobacco, such as cigarettes, e-cigarettes, and chewing tobacco. If you need help quitting, ask your health care provider.  Do not use street drugs.  Do not share needles.  Ask your health care provider for help if you need support or information about quitting drugs. Alcohol use  Do not drink alcohol if: ? Your health care provider tells you not to drink. ? You are pregnant, may be pregnant, or are planning to become pregnant.  If you drink alcohol: ? Limit how much you use to 0-1 drink a day. ? Limit intake if you are breastfeeding.  Be aware of how much alcohol is in your drink. In the [place] one drink equals one 12 oz bottle of beer (355 mL), one 5 oz glass of wine (148 mL), or one 1 oz glass of hard liquor (44 mL). General instructions  Schedule regular health, dental, and eye exams.  Stay current with your  vaccines.  Tell your health care provider if: ? You often feel depressed. ? You have ever been abused or do not feel safe at home. Summary  Adopting a healthy lifestyle and getting preventive care are important in promoting health and wellness.  Follow your health care provider's instructions about healthy diet, exercising, and getting tested or screened for diseases.  Follow your health care provider's instructions on monitoring your cholesterol and blood pressure. This information is not intended to replace advice given to you by your health care provider. Make sure you discuss any questions you have with your health care provider. Document Revised: 06/13/2018 Document Reviewed: 06/13/2018 Elsevier Patient Education  2020 [employer]  

## 2019-07-19 ENCOUNTER — Other Ambulatory Visit: Payer: Self-pay

## 2019-07-19 ENCOUNTER — Encounter: Payer: Self-pay | Admitting: Internal Medicine

## 2019-07-19 ENCOUNTER — Ambulatory Visit (INDEPENDENT_AMBULATORY_CARE_PROVIDER_SITE_OTHER): Payer: Medicare PPO | Admitting: Internal Medicine

## 2019-07-19 VITALS — BP 144/82 | HR 72 | Temp 98.4°F | Resp 16 | Ht 63.0 in | Wt 153.0 lb

## 2019-07-19 DIAGNOSIS — K219 Gastro-esophageal reflux disease without esophagitis: Secondary | ICD-10-CM

## 2019-07-19 DIAGNOSIS — Z0000 Encounter for general adult medical examination without abnormal findings: Secondary | ICD-10-CM | POA: Diagnosis not present

## 2019-07-19 DIAGNOSIS — M85852 Other specified disorders of bone density and structure, left thigh: Secondary | ICD-10-CM

## 2019-07-19 DIAGNOSIS — E039 Hypothyroidism, unspecified: Secondary | ICD-10-CM | POA: Diagnosis not present

## 2019-07-19 DIAGNOSIS — J4520 Mild intermittent asthma, uncomplicated: Secondary | ICD-10-CM

## 2019-07-19 DIAGNOSIS — E782 Mixed hyperlipidemia: Secondary | ICD-10-CM | POA: Diagnosis not present

## 2019-07-19 LAB — CBC WITH DIFFERENTIAL/PLATELET
Basophils Absolute: 0.1 10*3/uL (ref 0.0–0.1)
Basophils Relative: 0.7 % (ref 0.0–3.0)
Eosinophils Absolute: 0.2 10*3/uL (ref 0.0–0.7)
Eosinophils Relative: 2.2 % (ref 0.0–5.0)
HCT: 41.9 % (ref 36.0–46.0)
Hemoglobin: 13.9 g/dL (ref 12.0–15.0)
Lymphocytes Relative: 27.4 % (ref 12.0–46.0)
Lymphs Abs: 2.3 10*3/uL (ref 0.7–4.0)
MCHC: 33.2 g/dL (ref 30.0–36.0)
MCV: 94 fl (ref 78.0–100.0)
Monocytes Absolute: 0.7 10*3/uL (ref 0.1–1.0)
Monocytes Relative: 8 % (ref 3.0–12.0)
Neutro Abs: 5.3 10*3/uL (ref 1.4–7.7)
Neutrophils Relative %: 61.7 % (ref 43.0–77.0)
Platelets: 347 10*3/uL (ref 150.0–400.0)
RBC: 4.46 Mil/uL (ref 3.87–5.11)
RDW: 13.6 % (ref 11.5–15.5)
WBC: 8.6 10*3/uL (ref 4.0–10.5)

## 2019-07-19 LAB — COMPREHENSIVE METABOLIC PANEL
ALT: 12 U/L (ref 0–35)
AST: 23 U/L (ref 0–37)
Albumin: 4.4 g/dL (ref 3.5–5.2)
Alkaline Phosphatase: 82 U/L (ref 39–117)
BUN: 18 mg/dL (ref 6–23)
CO2: 28 mEq/L (ref 19–32)
Calcium: 10 mg/dL (ref 8.4–10.5)
Chloride: 101 mEq/L (ref 96–112)
Creatinine, Ser: 0.9 mg/dL (ref 0.40–1.20)
GFR: 61.27 mL/min (ref >60.00)
Glucose, Bld: 85 mg/dL (ref 70–99)
Potassium: 4.3 mEq/L (ref 3.5–5.1)
Sodium: 138 mEq/L (ref 135–145)
Total Bilirubin: 0.4 mg/dL (ref 0.2–1.2)
Total Protein: 7.5 g/dL (ref 6.0–8.3)

## 2019-07-19 LAB — TSH: TSH: 4.06 u[IU]/mL (ref 0.35–4.50)

## 2019-07-19 LAB — LIPID PANEL
Cholesterol: 207 mg/dL — ABNORMAL HIGH (ref 0–200)
HDL: 65.8 mg/dL (ref >39.00)
LDL Cholesterol: 110 mg/dL — ABNORMAL HIGH (ref 0–99)
NonHDL: 140.8
Total CHOL/HDL Ratio: 3
Triglycerides: 156 mg/dL — ABNORMAL HIGH (ref 0.0–149.0)
VLDL: 31.2 mg/dL (ref 0.0–40.0)

## 2019-07-19 NOTE — Assessment & Plan Note (Signed)
Chronic  Clinically euthyroid Check tsh  Titrate med dose if needed  

## 2019-07-19 NOTE — Assessment & Plan Note (Signed)
chronic Mild osteopenia Walking daily Taking vitamin d daily Last dexa 2017 - will ordered - to be done sometime this year

## 2019-07-19 NOTE — Assessment & Plan Note (Addendum)
Chronic Mild, intermittent Rarely uses albuterol - has only used it once in the past year Well-controlled Continue albuterol as needed

## 2019-07-19 NOTE — Assessment & Plan Note (Signed)
Chronic Check lipid panel, cmp Continue daily statin Regular exercise and healthy diet encouraged  

## 2019-07-19 NOTE — Assessment & Plan Note (Signed)
occ gerd Taking pepcid prn continue

## 2019-07-20 ENCOUNTER — Encounter: Payer: Self-pay | Admitting: Internal Medicine

## 2019-08-01 ENCOUNTER — Other Ambulatory Visit: Payer: Self-pay | Admitting: Obstetrics & Gynecology

## 2019-08-01 DIAGNOSIS — Z1231 Encounter for screening mammogram for malignant neoplasm of breast: Secondary | ICD-10-CM

## 2019-08-17 ENCOUNTER — Other Ambulatory Visit: Payer: Self-pay | Admitting: Allergy and Immunology

## 2019-09-02 ENCOUNTER — Other Ambulatory Visit: Payer: Self-pay | Admitting: Allergy and Immunology

## 2019-09-02 ENCOUNTER — Other Ambulatory Visit: Payer: Self-pay

## 2019-09-02 ENCOUNTER — Ambulatory Visit (INDEPENDENT_AMBULATORY_CARE_PROVIDER_SITE_OTHER): Payer: Medicare PPO | Admitting: Allergy and Immunology

## 2019-09-02 ENCOUNTER — Encounter: Payer: Self-pay | Admitting: Allergy and Immunology

## 2019-09-02 VITALS — BP 102/64 | HR 74 | Temp 97.6°F | Resp 18 | Ht 63.0 in | Wt 151.2 lb

## 2019-09-02 DIAGNOSIS — J3089 Other allergic rhinitis: Secondary | ICD-10-CM

## 2019-09-02 DIAGNOSIS — J4520 Mild intermittent asthma, uncomplicated: Secondary | ICD-10-CM

## 2019-09-02 NOTE — Assessment & Plan Note (Signed)
Stable.  Continue appropriate allergen avoidance measures, nasal saline irrigation, azelastine nasal spray, and fluticasone nasal spray as needed.

## 2019-09-02 NOTE — Assessment & Plan Note (Signed)
Well-controlled.  Continue albuterol HFA, 1-2 inhalations every 4-6 hours if needed.  During respiratory tract infections or asthma flares, add Flovent 44g 2 inhalations via spacer device 2 times per day until symptoms have returned to baseline.  If subjective and objective measures of pulmonary function remain stable, we will consider stepping down therapy on the next visit. 

## 2019-09-02 NOTE — Progress Notes (Signed)
Follow-up Note  RE: Pamela Rich MRN: [id number] DOB: [birth year] Date of Office Visit: 09/02/2019  Primary care provider: Binnie Rail, MD Referring provider: Binnie Rail, MD  History of present illness: Pamela Rich is a 74 y.o. female with asthma and allergic rhinitis presenting today for follow-up.  She was last seen in this clinic in September 2020.  She reports that in the interval since her previous visit her asthma has been well controlled.  She has not required albuterol rescue and denies limitations in normal daily activities or nocturnal awakenings due to lower respiratory symptoms.  She has experienced some nasal congestion due to water/mold damage in the home which is currently under remediation.  However, she reports that her nasal/sinus symptoms have been well controlled with nasal saline rinse, azelastine nasal spray, and fluticasone nasal spray.  Assessment and plan: Mild intermittent asthma Well-controlled.  Continue albuterol HFA, 1-2 inhalations every 4-6 hours if needed.  During respiratory tract infections or asthma flares, add Flovent 44g 2 inhalations via spacer device 2 times per day until symptoms have returned to baseline.  If subjective and objective measures of pulmonary function remain stable, we will consider stepping down therapy on the next visit.  Other allergic rhinitis Stable.  Continue appropriate allergen avoidance measures, nasal saline irrigation, azelastine nasal spray, and fluticasone nasal spray as needed.   Diagnostics: Spirometry:  Normal with an FEV1 of 80% predicted with an FEV1 ratio of 103%. This study was performed while the patient was asymptomatic.  Please see scanned spirometry results for details.    Physical examination: Blood pressure 102/64, pulse 74, temperature 97.6 F (36.4 C), temperature source Temporal, resp. rate 18, height 5\' 3"  (1.6 m), weight 151 lb 3.2 oz (68.6 kg), last menstrual period 07/04/2000,  SpO2 97 %.  General: Alert, interactive, in no acute distress. HEENT: TMs pearly gray, turbinates mildly edematous without discharge, post-pharynx unremarkable. Neck: Supple without lymphadenopathy. Lungs: Clear to auscultation without wheezing, rhonchi or rales. CV: Normal S1, S2 without murmurs. Skin: Warm and dry, without lesions or rashes.  The following portions of the patient's history were reviewed and updated as appropriate: allergies, current medications, past family history, past medical history, past social history, past surgical history and problem list.  Current Outpatient Medications  Medication Sig Dispense Refill  . albuterol (PROAIR HFA) 108 (90 Base) MCG/ACT inhaler Inhale 2 puffs into the lungs every 4 (four) hours as needed for wheezing (using 1 puff daily prn). Or coughing spells. 18 g 1  . Ascorbic Acid (VITAMIN C) 1000 MG tablet Take 1,000 mg by mouth daily.      Marland Kitchen aspirin 81 MG tablet Take 81 mg by mouth daily.    Marland Kitchen azelastine (ASTELIN) 0.1 % nasal spray Use in each nostril as directed 30 mL 5  . Cholecalciferol (VITAMIN D3) 1000 UNITS CAPS Take by mouth daily.      . fluticasone (FLONASE) 50 MCG/ACT nasal spray SPRAY 2 SPRAYS INTO EACH NOSTRIL EVERY DAY 48 mL 1  . fluticasone (FLOVENT HFA) 44 MCG/ACT inhaler Inhale 2 puffs into the lungs 2 (two) times daily. [id number] g 5  . folic acid (FOLVITE) 1 MG tablet Take 1 mg by mouth daily.    Marland Kitchen levothyroxine (SYNTHROID) 50 MCG tablet TAKE 2 TABLETS (100 MCG TOTAL) BY MOUTH DAILY. 180 tablet 1  . Multiple Vitamin (MULTIVITAMIN) tablet Take by mouth.    . pravastatin (PRAVACHOL) 20 MG tablet TAKE 1 TABLET BY MOUTH EVERY DAY 90  tablet 1   No current facility-administered medications for this visit.    No Known Allergies  I appreciate the opportunity to take part in [place] care. Please do not hesitate to contact me with questions.  Sincerely,   R. Edgar Frisk, MD

## 2019-09-02 NOTE — Patient Instructions (Addendum)
Mild intermittent asthma Well-controlled.  Continue albuterol HFA, 1-2 inhalations every 4-6 hours if needed.  During respiratory tract infections or asthma flares, add Flovent 44g 2 inhalations via spacer device 2 times per day until symptoms have returned to baseline.  If subjective and objective measures of pulmonary function remain stable, we will consider stepping down therapy on the next visit.  Other allergic rhinitis Stable.  Continue appropriate allergen avoidance measures, nasal saline irrigation, azelastine nasal spray, and fluticasone nasal spray as needed.   Return in about 5 months (around 02/02/2020), or if symptoms worsen or fail to improve.

## 2019-09-03 ENCOUNTER — Other Ambulatory Visit: Payer: Self-pay | Admitting: Allergy and Immunology

## 2019-09-03 ENCOUNTER — Ambulatory Visit: Payer: Medicare Other

## 2019-09-05 ENCOUNTER — Ambulatory Visit: Payer: Medicare Other

## 2019-09-05 ENCOUNTER — Other Ambulatory Visit: Payer: Self-pay

## 2019-09-05 MED ORDER — PRAVASTATIN SODIUM 20 MG PO TABS: 20.0000 mg | ORAL_TABLET | Freq: Every day | ORAL | 1 refills | Status: DC

## 2019-09-11 DIAGNOSIS — H2513 Age-related nuclear cataract, bilateral: Secondary | ICD-10-CM | POA: Diagnosis not present

## 2019-09-11 DIAGNOSIS — H43813 Vitreous degeneration, bilateral: Secondary | ICD-10-CM | POA: Diagnosis not present

## 2019-09-11 DIAGNOSIS — H5213 Myopia, bilateral: Secondary | ICD-10-CM | POA: Diagnosis not present

## 2019-09-11 DIAGNOSIS — H524 Presbyopia: Secondary | ICD-10-CM | POA: Diagnosis not present

## 2019-09-20 ENCOUNTER — Telehealth: Payer: Self-pay | Admitting: Internal Medicine

## 2019-09-20 ENCOUNTER — Ambulatory Visit (INDEPENDENT_AMBULATORY_CARE_PROVIDER_SITE_OTHER): Payer: Medicare PPO | Admitting: Family Medicine

## 2019-09-20 VITALS — BP 118/80 | HR 76 | Temp 98.7°F | Wt 150.6 lb

## 2019-09-20 DIAGNOSIS — R0602 Shortness of breath: Secondary | ICD-10-CM | POA: Diagnosis not present

## 2019-09-20 DIAGNOSIS — J3089 Other allergic rhinitis: Secondary | ICD-10-CM | POA: Diagnosis not present

## 2019-09-20 DIAGNOSIS — J4520 Mild intermittent asthma, uncomplicated: Secondary | ICD-10-CM

## 2019-09-20 MED ORDER — ALBUTEROL SULFATE HFA 108 (90 BASE) MCG/ACT IN AERS: INHALATION_SPRAY | RESPIRATORY_TRACT | 2 refills | Status: DC

## 2019-09-20 MED ORDER — BENZONATATE 100 MG PO CAPS: 100.0000 mg | ORAL_CAPSULE | Freq: Three times a day (TID) | ORAL | 1 refills | Status: DC | PRN

## 2019-09-20 NOTE — Patient Instructions (Addendum)
Use your albuterol inhaler for shortness of breath, wheezing and or coughing spells 2 puffs  every 4 hours every as needed.   Take Benzonatate 100-200 mg every 4-6 hours as needed  for cough as needed.     Allergic Rhinitis, Adult Allergic rhinitis is an allergic reaction that affects the mucous membrane inside the nose. It causes sneezing, a runny or stuffy nose, and the feeling of mucus going down the back of the throat (postnasal drip). Allergic rhinitis can be mild to severe. There are two types of allergic rhinitis:  Seasonal. This type is also called hay fever. It happens only during certain seasons.  Perennial. This type can happen at any time of the year. What are the causes? This condition happens when the body's defense system (immune system) responds to certain harmless substances called allergens as though they were germs.  Seasonal allergic rhinitis is triggered by pollen, which can come from grasses, trees, and weeds. Perennial allergic rhinitis may be caused by:  House dust mites.  Pet dander.  Mold spores. What are the signs or symptoms? Symptoms of this condition include:  Sneezing.  Runny or stuffy nose (nasal congestion).  Postnasal drip.  Itchy nose.  Tearing of the eyes.  Trouble sleeping.  Daytime sleepiness. How is this diagnosed? This condition may be diagnosed based on:  Your medical history.  A physical exam.  Tests to check for related conditions, such as: ? Asthma. ? Pink eye. ? Ear infection. ? Upper respiratory infection.  Tests to find out which allergens trigger your symptoms. These may include skin or blood tests. How is this treated? There is no cure for this condition, but treatment can help control symptoms. Treatment may include:  Taking medicines that block allergy symptoms, such as antihistamines. Medicine may be given as a shot, nasal spray, or pill.  Avoiding the allergen.  Desensitization. This treatment involves  getting ongoing shots until your body becomes less sensitive to the allergen. This treatment may be done if other treatments do not help.  If taking medicine and avoiding the allergen does not work, new, stronger medicines may be prescribed. Follow these instructions at home:  Find out what you are allergic to. Common allergens include smoke, dust, and pollen.  Avoid the things you are allergic to. These are some things you can do to help avoid allergens: ? Replace carpet with wood, tile, or vinyl flooring. Carpet can trap dander and dust. ? Do not smoke. Do not allow smoking in your home. ? Change your heating and air conditioning filter at least once a month. ? During allergy season:  Keep windows closed as much as possible.  Plan outdoor activities when pollen counts are lowest. This is usually during the evening hours.  When coming indoors, change clothing and shower before sitting on furniture or bedding.  Take over-the-counter and prescription medicines only as told by your health care provider.  Keep all follow-up visits as told by your health care provider. This is important. Contact a health care provider if:  You have a fever.  You develop a persistent cough.  You make whistling sounds when you breathe (you wheeze).  Your symptoms interfere with your normal daily activities. Get help right away if:  You have shortness of breath. Summary  This condition can be managed by taking medicines as directed and avoiding allergens.  Contact your health care provider if you develop a persistent cough or fever.  During allergy season, keep windows closed as much  as possible. This information is not intended to replace advice given to you by your health care provider. Make sure you discuss any questions you have with your health care provider. Document Revised: 06/02/2017 Document Reviewed: 07/28/2016 Elsevier Patient Education  2020 [employer].

## 2019-09-20 NOTE — Progress Notes (Signed)
Patient ID: AMIT CEASE, female    DOB: 01/01/1946, 74 y.o.   MRN: [id number]  PCP: Binnie Rail, MD  Chief Complaint  Patient presents with  . Shortness of Breath    wheezing,fatigue,sore throat,congestion    Subjective:  HPI  Pamela Rich is a 74 y.o. female presents to [facility] for evaluation of symptoms related to wheezing, congestions, cough, and scratchy throat. Patient endorses a history of chronic mild intermittent asthma and seasonal allergies. She doesn't use her inhaler and occasionally states an OTC anti allergy medication. Symptoms have improved since speaking with PCP. She endorsed an occasional cough. In the past cough has resolved with benzonatate. She is afebrile. She was seen at ophthalmology less than 1 week ago and received contact lenses which are not improving visual acuity. Endorses worsening SOB at rest and with exertion.    Coughing with congestion intermittently over the last few days. She grew concern as she measured her temperature yesterday and obtained a reading of 98.8 F which is a concerning for a "fever" as her baseline temp is typically in around 97. She just wanted to make sure she not coming down with anything. She noticed some bumps on her tongue yesterday. Bumps were painless. Denies eating new foods yesterday. She is eating and drinking normally. Denies any GI symptoms, CP, dizziness or new weakness.  Review of Systems  Pertinent negatives listed in HPI  Patient Active Problem List   Diagnosis Date Noted  . PAD (peripheral artery disease) ([place]) 04/01/2019  . De Quervain's syndrome (tenosynovitis) 03/12/2018  . Hearing loss, wears hearing aids 06/22/2017  . Radicular pain of left lower extremity 02/28/2017  . Chronic pain of both knees 06/21/2016  . Mild intermittent asthma 07/20/2015  . Other allergic rhinitis 07/20/2015  . Mixed hyperlipidemia 03/01/2012  . Migraine without aura 04/05/2010  . Vitamin D deficiency  03/19/2009  . GERD (gastroesophageal reflux disease) 07/08/2008  . DEGENERATIVE JOINT DISEASE 12/05/2007  . Hypothyroidism 01/22/2007  . IBS 09/28/2006  . Osteopenia 09/28/2006      Prior to Admission medications   Medication Sig Start Date End Date Taking? Authorizing Provider  Ascorbic Acid (VITAMIN C) 1000 MG tablet Take 1,000 mg by mouth daily.      [provider]  aspirin 81 MG tablet Take 81 mg by mouth daily.    [provider]  azelastine (ASTELIN) 0.1 % nasal spray Use in each nostril as directed 04/01/19   Bobbitt, Sedalia Muta, MD  Cholecalciferol (VITAMIN D3) 1000 UNITS CAPS Take by mouth daily.      [provider]  fluticasone (FLONASE) 50 MCG/ACT nasal spray SPRAY 2 SPRAYS INTO EACH NOSTRIL EVERY DAY 09/02/19   Bobbitt, Sedalia Muta, MD  fluticasone (FLOVENT HFA) 44 MCG/ACT inhaler Inhale 2 puffs into the lungs 2 (two) times daily. 04/01/19   Bobbitt, Sedalia Muta, MD  folic acid (FOLVITE) 1 MG tablet Take 1 mg by mouth daily.    [provider]  levothyroxine (SYNTHROID) 50 MCG tablet TAKE 2 TABLETS (100 MCG TOTAL) BY MOUTH DAILY. 03/11/19   Binnie Rail, MD  Multiple Vitamin (MULTIVITAMIN) tablet Take by mouth.    [provider]  pravastatin (PRAVACHOL) 20 MG tablet Take 1 tablet (20 mg total) by mouth daily. 09/05/19   Binnie Rail, MD  PROAIR HFA 108 [phone] Base) MCG/ACT inhaler INHALE 2 PUFFS EVERY 4 HOURS AS NEEDED FOR WHEEZING OR COUGHING SPELLS 09/03/19   Bobbitt, Sedalia Muta, MD  Past Medical, Surgical Family and Social History reviewed and updated.    Objective:   Today's Vitals   09/20/19 1736  BP: 118/80  Pulse: 76  Temp: 98.7 F (37.1 C)  SpO2: 97%  Weight: 150 lb 9.6 oz (68.3 kg)    Wt Readings from Last 3 Encounters:  09/20/19 150 lb 9.6 oz (68.3 kg)  09/02/19 151 lb 3.2 oz (68.6 kg)  07/19/19 153 lb (69.4 kg)     Physical Exam Constitutional: Patient appears well-developed and well-nourished. No  distress. HENT: Normocephalic, atraumatic, External right and left ear normal. Congestion present. Oropharynx is clear and moist. Tongue normal. Eyes: Conjunctivae and EOM are normal. PERRLA, no scleral icterus. Neck: Normal ROM. Neck supple. No JVD. No tracheal deviation. No thyromegaly. CVS: RRR, S1/S2 +, no murmurs, no gallops, no carotid bruit.  Pulmonary: Effort and breath sounds normal, no stridor, rhonchi, wheezes, rales.  Abdominal: Soft. BS +, no distension, tenderness, rebound or guarding.  Musculoskeletal: Normal range of motion. No edema and no tenderness.  Lymphadenopathy: No lymphadenopathy noted, cervical. Neuro: Alert. Normal reflexes, muscle tone coordination. No cranial nerve deficit. Skin: Skin is warm and dry Psychiatric: Mood and Affect normal. Cognition normal     Assessment & Plan:  1. SOB (shortness of breath) -Resume use of albuterol inhaler as directed at the first sign of chest tightness, SOB, or wheezing  -Resume Fluticasone daily  2. Mild intermittent asthma without complication, controlled without active exacerbation -See #2   3. Environmental and seasonal allergies -Benzonatate PRN for cough r/t PND  -Use Flonase daily  F/u with PCP if symptoms worsen or do not improve.   -The patient was given clear instructions to go to ER or return to medical center if symptoms do not improve, worsen or new problems develop. The patient verbalized understanding.     Molli Barrows, FNP-C [facility], PRN Provider  [facility]. [place], [place] [facility] Phone: [phone] Clinic Fax: [phone] Clinic Hours: 5:30 pm -7:30 pm (Monday-Friday)

## 2019-09-20 NOTE — Telephone Encounter (Signed)
Appointment scheduled at respiratory clinic this afternoon

## 2019-09-20 NOTE — Telephone Encounter (Signed)
New Message:   Pt is calling and states she just had her second dosage of the vaccine but 3 weeks later she is having some chills, low grade fever, and a sore throat. She would like to know should she come in or what does she need to do. Please advise.

## 2019-09-21 ENCOUNTER — Other Ambulatory Visit: Payer: Self-pay | Admitting: Internal Medicine

## 2019-10-10 ENCOUNTER — Telehealth: Payer: Self-pay | Admitting: Allergy and Immunology

## 2019-10-10 ENCOUNTER — Other Ambulatory Visit: Payer: Self-pay

## 2019-10-10 MED ORDER — PREDNISONE 10 MG PO TABS: ORAL_TABLET | ORAL | 0 refills | Status: DC

## 2019-10-10 MED ORDER — FLOVENT HFA 110 MCG/ACT IN AERO: 2.0000 | INHALATION_SPRAY | Freq: Two times a day (BID) | RESPIRATORY_TRACT | 5 refills | Status: DC

## 2019-10-10 NOTE — Telephone Encounter (Signed)
Spoke with patient. She is experiencing cough, shortness of breath and nasal congestion. She verbalizes that the cough is productive and yellow in color. She is currently out of her Flovent and has been using her albuterol 2 puffs every 4 hours. She is using nasal saline followed by Azelastine 2 sprays twice daily and Flonase 2 sprays twice daily. She is also taking "a non-drowsy antihistamine" that is 10mg  but she feels like it is not helping. Please advise.

## 2019-10-10 NOTE — Telephone Encounter (Signed)
Please make sure that the nasal saline is nasal saline lavage rather than spray. Please call in a prescription for Flovent (fluticasone) 110 g, 2 inhalations via spacer device twice a day. Please call in prednisone 40 mg x3 days, 20 mg x1 day, 10 mg x1 day, then stop. Asked the patient to stop the antihistamine and to add guaifenesin [phone] mg (Mucinex)  twice daily as needed with adequate hydration as discussed. Please have the patient call us if she develops fevers, chills, or foul/fetid mucus. Thanks.

## 2019-10-10 NOTE — Telephone Encounter (Signed)
Patient called and states that her allergies and asthma is flaring up and would like to know if there is anything that could be called in to help her. Patient states that she has been using her inhaler, saline and nasal spray which are not helping effectively.   Please advise.

## 2019-10-10 NOTE — Progress Notes (Signed)
Fl

## 2019-10-14 ENCOUNTER — Ambulatory Visit (INDEPENDENT_AMBULATORY_CARE_PROVIDER_SITE_OTHER): Payer: Medicare PPO

## 2019-10-14 ENCOUNTER — Other Ambulatory Visit: Payer: Self-pay

## 2019-10-14 VITALS — BP 128/70 | HR 73 | Temp 98.2°F | Resp 16 | Ht 63.0 in | Wt 153.2 lb

## 2019-10-14 DIAGNOSIS — Z0000 Encounter for general adult medical examination without abnormal findings: Secondary | ICD-10-CM

## 2019-10-14 NOTE — Progress Notes (Signed)
Subjective:   Pamela Rich is a 74 y.o. female who presents for Medicare Annual (Subsequent) preventive examination.  Review of Systems:  Medicare Wellness Visit. Cardiac Risk Factors include: advanced age (>70men, >26 women);dyslipidemia  Sleep Patterns: No issues with falling sleep; feels rested on waking and sleeps 6-8 hours nightly. Home Safety/Smoke Alarms: Feels safe in home; Smoke alarms in place. Living environment: 1.5-story home (lives on the first floor) and lives alone. Seat Belt Safety/Bike Helmet: Wears seat belt.    Objective:     Vitals: BP 128/70 (BP Location: Left Arm, Patient Position: Sitting, Cuff Size: Normal)   Pulse 73   Temp 98.2 F (36.8 C)   Resp 16   Ht 5\' 3"  (1.6 m)   Wt 153 lb 3.2 oz (69.5 kg)   LMP 07/04/2000   SpO2 97%   BMI 27.14 kg/m   Body mass index is 27.14 kg/m.  Advanced Directives 10/14/2019 08/30/2018 03/20/2018 03/12/2018 06/21/2016 05/06/2015 05/06/2015  Does Patient Have a Medical Advance Directive? Yes Yes Yes Yes Yes - Yes  Type of Advance Directive [place];Living will [place];Living will [place];Living will [place];Living will Living will;Healthcare Power of Attorney Living will;Healthcare Power of [place]  Does patient want to make changes to medical advance directive? No - Patient declined - - No - Patient declined - - -  Copy of [place] in Chart? - No - copy requested No - copy requested No - copy requested Yes - Yes    Tobacco Social History   Tobacco Use  Smoking Status Former Smoker  . Packs/day: 0.30  . Years: 40.00  . Pack years: 12.00  . Types: Cigarettes  . Quit date: 07/04/1974  . Years since quitting: 45.3  Smokeless Tobacco Never Used  Tobacco Comment   smoked 1965-1976 , < 3 cig/ day     Counseling given: No Comment: smoked 1965-1976 , < 3 cig/ day   Clinical  Intake:  Pre-visit preparation completed: Yes  Pain : No/denies pain Pain Score: 0-No pain     BMI - recorded: 27 Nutritional Status: BMI 25 -29 Overweight Nutritional Risks: None Diabetes: No  How often do you need to have someone help you when you read instructions, pamphlets, or other written materials from your doctor or pharmacy?: 1 - Never What is the last grade level you completed in school?: 12th grade  Interpreter Needed?: No  Information entered by :: Deseri Loss N. Lowell Guitar, LPN  Past Medical History:  Diagnosis Date  . Allergy    SEASONAL  . Fibroid    no surgery  . Heart murmur   . Hyperlipidemia   . Hypothyroidism   . Migraines   . Mild intermittent asthma 07/20/2015   pt is currently not taking any medications  . Osteopenia   . Sciatic nerve lesion 2018  . Vitamin D deficiency    Past Surgical History:  Procedure Laterality Date  . cortisone injections     in left hand  . DENTAL SURGERY    . DORSAL COMPARTMENT RELEASE Right 03/20/2018   Procedure: RIGHT OPEN  RELEASE DORSAL COMPARTMENT (DEQUERVAINS);  Surgeon: Daryll Brod, MD;  Location: [facility];  Service: Orthopedics;  Laterality: Right;  . LUMBAR FUSION     Dr Hal Neer  . POLYPECTOMY     uterine  . SEPTOPLASTY    . SINOSCOPY    . TUBAL LIGATION    .  WRIST ARTHROSCOPY WITH FOVEAL TRIANGULAR FIBROCARTILAGE COMPLEX REPAIR Right 03/20/2018   Procedure: RIGHT WRIST ARTHROSCOPY WITH DEBRIDEMENT, SCAPHOLUNATE/TRIANGULAR FIBROCARTILAGE COMPLEX REPAIR;  Surgeon: Daryll Brod, MD;  Location: [facility];  Service: Orthopedics;  Laterality: Right;   Family History  Problem Relation Age of Onset  . Hypertension Mother   . Heart disease Mother        CHF; AS  . Osteoporosis Mother   . Heart attack Mother 25  . Allergic rhinitis Mother   . Diabetes Father   . Stroke Father        onset in 33s  . Cancer Brother        malignant brain tumor  . Colon cancer Neg Hx   .  Esophageal cancer Neg Hx   . Rectal cancer Neg Hx   . Stomach cancer Neg Hx   . Angioedema Neg Hx   . Asthma Neg Hx   . Eczema Neg Hx   . Immunodeficiency Neg Hx   . Urticaria Neg Hx   . Breast cancer Neg Hx    Social History   Socioeconomic History  . Marital status: Widowed    Spouse name: Not on file  . Number of children: 3  . Years of education: Not on file  . Highest education level: Not on file  Occupational History  . Occupation: retired  Tobacco Use  . Smoking status: Former Smoker    Packs/day: 0.30    Years: 40.00    Pack years: 12.00    Types: Cigarettes    Quit date: 07/04/1974    Years since quitting: 45.3  . Smokeless tobacco: Never Used  . Tobacco comment: smoked 1965-1976 , < 3 cig/ day  Substance and Sexual Activity  . Alcohol use: No    Alcohol/week: 0.0 standard drinks    Comment: rarely  . Drug use: No  . Sexual activity: Not Currently    Partners: Male    Birth control/protection: Surgical, Post-menopausal    Comment: BTL  Other Topics Concern  . Not on file  Social History Narrative   Exercise: walking, stretching, weights   Social Determinants of Health   Financial Resource Strain:   . Difficulty of Paying Living Expenses:   Food Insecurity:   . Worried About Charity fundraiser in the Last Year:   . Arboriculturist in the Last Year:   Transportation Needs:   . Film/video editor (Medical):   Marland Kitchen Lack of Transportation (Non-Medical):   Physical Activity:   . Days of Exercise per Week:   . Minutes of Exercise per Session:   Stress:   . Feeling of Stress :   Social Connections:   . Frequency of Communication with Friends and Family:   . Frequency of Social Gatherings with Friends and Family:   . Attends Religious Services:   . Active Member of Clubs or Organizations:   . Attends Archivist Meetings:   Marland Kitchen Marital Status:     Outpatient Encounter Medications as of 10/14/2019  Medication Sig  . albuterol (PROAIR HFA) 108  (90 Base) MCG/ACT inhaler INHALE 2 PUFFS EVERY 4 HOURS AS NEEDED FOR WHEEZING OR COUGHING SPELLS  . Ascorbic Acid (VITAMIN C) 1000 MG tablet Take 1,000 mg by mouth daily.    Marland Kitchen aspirin 81 MG tablet Take 81 mg by mouth daily.  Marland Kitchen azelastine (ASTELIN) 0.1 % nasal spray Use in each nostril as directed  . benzonatate (TESSALON) 100 MG capsule Take 1-2  capsules (100-200 mg total) by mouth 3 (three) times daily as needed for cough.  . Cholecalciferol (VITAMIN D3) 1000 UNITS CAPS Take by mouth daily.    . fluticasone (FLONASE) 50 MCG/ACT nasal spray SPRAY 2 SPRAYS INTO EACH NOSTRIL EVERY DAY  . fluticasone (FLOVENT HFA) 110 MCG/ACT inhaler Inhale 2 puffs into the lungs 2 (two) times daily. Use with spacer.  . fluticasone (FLOVENT HFA) 44 MCG/ACT inhaler Inhale 2 puffs into the lungs 2 (two) times daily.  . folic acid (FOLVITE) 1 MG tablet Take 1 mg by mouth daily.  Marland Kitchen levothyroxine (SYNTHROID) 50 MCG tablet TAKE 2 TABLETS (100 MCG TOTAL) BY MOUTH DAILY.  . Multiple Vitamin (MULTIVITAMIN) tablet Take by mouth.  . pravastatin (PRAVACHOL) 20 MG tablet Take 1 tablet (20 mg total) by mouth daily.  . predniSONE (DELTASONE) 10 MG tablet Take 4 daily for 3 days then 2 daily for 1 day  then 1 daily for 1 day.   No facility-administered encounter medications on file as of 10/14/2019.    Activities of Daily Living In your present state of health, do you have any difficulty performing the following activities: 10/14/2019  Hearing? Y  Vision? N  Difficulty concentrating or making decisions? N  Walking or climbing stairs? N  Dressing or bathing? N  Doing errands, shopping? N  Preparing Food and eating ? N  Using the Toilet? N  In the past six months, have you accidently leaked urine? N  Do you have problems with loss of bowel control? N  Managing your Medications? N  Managing your Finances? N  Housekeeping or managing your Housekeeping? N  Some recent data might be hidden    Patient Care Team: Binnie Rail, MD as PCP - General (Internal Medicine) Bobbitt, Sedalia Muta, MD as Consulting Physician (Allergy and Immunology) Megan Salon, MD as Consulting Physician (Gynecology) Danella Sensing, MD as Consulting Physician (Dermatology) Ralene Bathe, MD as Consulting Physician (Ophthalmology)    Assessment:   This is a routine wellness examination for [place].  Exercise Activities and Dietary recommendations Current Exercise Habits: The patient does not participate in regular exercise at present, Exercise limited by: respiratory conditions(s)  Goals    . Client understands the importance of follow-up with providers by attending scheduled visits    . Exercise 150 minutes per week (moderate activity)     Increase exercise by joining [employer] at [facility].     . patient (pt-stated)     More strength training with stretch bands, low weights, go slow and stop with pain  Adapting to change is ongoing; will adapt some stress reduction;   Vacation an little more; Go on vacation x 2 weeks;     . Patient Stated     I want to increase my physical activity by walking more regularly.        Fall Risk Fall Risk  10/14/2019 08/30/2018 07/11/2018 06/22/2017 06/21/2016  Falls in the past year? 0 0 1 Yes No  Number falls in past yr: 0 0 0 1 -  Injury with Fall? 0 - - Yes -  Risk for fall due to : No Fall Risks - - - -  Follow up Falls evaluation completed;Education provided;Falls prevention discussed - - - -   Is the patient's home free of loose throw rugs in walkways, pet beds, electrical cords, etc?   yes      Grab bars in the bathroom? yes      Handrails on the stairs?  yes      Adequate lighting?   yes  Depression Screen PHQ 2/9 Scores 10/14/2019 08/30/2018 07/11/2018 06/22/2017  PHQ - 2 Score 0 0 0 0     Cognitive Function     6CIT Screen 10/14/2019  What Year? 0 points  What month? 0 points  What time? 0 points  Count back from 20 0 points  Months in reverse 0 points   Repeat phrase 0 points  Total Score 0    Immunization History  Administered Date(s) Administered  . Fluad Quad(high Dose 65+) 03/14/2019  . Influenza Split 04/06/2011, 04/18/2012  . Influenza Whole 04/18/2007, 04/23/2008, 04/08/2009, 03/26/2010  . Influenza, High Dose Seasonal PF 04/16/2013, 03/24/2016, 04/04/2017, 04/13/2018  . Influenza,inj,Quad PF,6+ Mos 03/18/2014, 03/24/2015  . Pneumococcal Conjugate-13 11/04/2014  . Pneumococcal Polysaccharide-23 01/22/2007, 11/12/2012  . Tdap 07/26/2012  . Zoster 09/24/2007    Screening Tests Health Maintenance  Topic Date Due  . DEXA SCAN  11/05/2018  . INFLUENZA VACCINE  02/02/2020  . MAMMOGRAM  04/15/2021  . TETANUS/TDAP  07/26/2022  . COLONOSCOPY  12/26/2022  . Hepatitis C Screening  Completed  . PNA vac Low Risk Adult  Completed    Cancer Screenings: Lung: Low Dose CT Chest recommended if Age 98-80 years, 30 pack-year currently smoking OR have quit w/in 15years. Patient does not qualify. Breast:  Up to date on Mammogram? Yes   Up to date of Bone Density/Dexa? Yes Colorectal: last done on 12/25/2012; no repeat due to age     Plan:     Reviewed health maintenance screenings with patient today and relevant education, vaccines, and/or referrals were provided.    Continue doing brain stimulating activities (puzzles, reading, adult coloring books, staying active) to keep memory sharp.    Continue to eat heart healthy diet (full of fruits, vegetables, whole grains, lean protein, water--limit salt, fat, and sugar intake) and increase physical activity as tolerated.  I have personally reviewed and noted the following in the patient's chart:   . Medical and social history . Use of alcohol, tobacco or illicit drugs  . Current medications and supplements . Functional ability and status . Nutritional status . Physical activity . Advanced directives . List of other physicians . Hospitalizations, surgeries, and ER visits in  previous 12 months . Vitals . Screenings to include cognitive, depression, and falls . Referrals and appointments  In addition, I have reviewed and discussed with patient certain preventive protocols, quality metrics, and best practice recommendations. A written personalized care plan for preventive services as well as general preventive health recommendations were provided to patient.     Sheral Flow, LPN  [id number]

## 2019-10-14 NOTE — Patient Instructions (Signed)
Pamela Rich , Thank you for taking time to come for your Medicare Wellness Visit. I appreciate your ongoing commitment to your health goals. Please review the following plan we discussed and let me know if I can assist you in the future.   Screening recommendations/referrals: Colorectal Screening: last done 12/25/2012 Mammogram: last done 04/16/2019 Bone Density: last done 11/05/2015  Vision and Dental Exams: Recommended annual ophthalmology exams for early detection of glaucoma and other disorders of the eye Recommended annual dental exams for proper oral hygiene  Vaccinations: Influenza vaccine: last done 03/14/2019 Pneumococcal vaccine: last done 11/12/2012 & 11/04/2014 Tdap vaccine: last done on 08/26/2012; due every 10 years Covid Vaccine: [employer] completed  Advanced directives: Advance directives discussed with you today. Please bring a copy of your POA (Power of [place]) and/or Living Will to your next appointment.  Goals:  Recommend to drink at least 6-8 8oz glasses of water per day.  Recommend to exercise for at least 150 minutes per week.  Recommend to remove any items from the home that may cause slips or trips.  Recommend to decrease portion sizes by eating 3 small healthy meals and at least 2 healthy snacks per day.  Recommend to begin DASH diet as directed below  Recommend to continue efforts to reduce smoking habits until no longer smoking. Smoking Cessation literature is attached below.  Next appointment: Please schedule your Annual Wellness Visit with your Nurse Health Advisor in one year.  Preventive Care 74 Years and Older, Female Preventive care refers to lifestyle choices and visits with your health care provider that can promote health and wellness. What does preventive care include?  A yearly physical exam. This is also called an annual well check.  Dental exams once or twice a year.  Routine eye exams. Ask your health care provider how often you should have  your eyes checked.  Personal lifestyle choices, including:  Daily care of your teeth and gums.  Regular physical activity.  Eating a healthy diet.  Avoiding tobacco and drug use.  Limiting alcohol use.  Practicing safe sex.  Taking low-dose aspirin every day if recommended by your health care provider.  Taking vitamin and mineral supplements as recommended by your health care provider. What happens during an annual well check? The services and screenings done by your health care provider during your annual well check will depend on your age, overall health, lifestyle risk factors, and family history of disease. Counseling  Your health care provider may ask you questions about your:  Alcohol use.  Tobacco use.  Drug use.  Emotional well-being.  Home and relationship well-being.  Sexual activity.  Eating habits.  History of falls.  Memory and ability to understand (cognition).  Work and work Statistician.  Reproductive health. Screening  You may have the following tests or measurements:  Height, weight, and BMI.  Blood pressure.  Lipid and cholesterol levels. These may be checked every 5 years, or more frequently if you are over 48 years old.  Skin check.  Lung cancer screening. You may have this screening every year starting at age 57 if you have a 30-pack-year history of smoking and currently smoke or have quit within the past 15 years.  Fecal occult blood test (FOBT) of the stool. You may have this test every year starting at age 35.  Flexible sigmoidoscopy or colonoscopy. You may have a sigmoidoscopy every 5 years or a colonoscopy every 10 years starting at age 45.  Hepatitis C blood test.  Hepatitis  B blood test.  Sexually transmitted disease (STD) testing.  Diabetes screening. This is done by checking your blood sugar (glucose) after you have not eaten for a while (fasting). You may have this done every 1-3 years.  Bone density scan. This is  done to screen for osteoporosis. You may have this done starting at age 65.  Mammogram. This may be done every 1-2 years. Talk to your health care provider about how often you should have regular mammograms. Talk with your health care provider about your test results, treatment options, and if necessary, the need for more tests. Vaccines  Your health care provider may recommend certain vaccines, such as:  Influenza vaccine. This is recommended every year.  Tetanus, diphtheria, and acellular pertussis (Tdap, Td) vaccine. You may need a Td booster every 10 years.  Zoster vaccine. You may need this after age 39.  Pneumococcal 13-valent conjugate (PCV13) vaccine. One dose is recommended after age 51.  Pneumococcal polysaccharide (PPSV23) vaccine. One dose is recommended after age 67. Talk to your health care provider about which screenings and vaccines you need and how often you need them. This information is not intended to replace advice given to you by your health care provider. Make sure you discuss any questions you have with your health care provider. Document Released: 07/17/2015 Document Revised: 03/09/2016 Document Reviewed: 04/21/2015 Elsevier Interactive Patient Education  2017 [employer] Prevention in the Home Falls can cause injuries. They can happen to people of all ages. There are many things you can do to make your home safe and to help prevent falls. What can I do on the outside of my home?  Regularly fix the edges of walkways and driveways and fix any cracks.  Remove anything that might make you trip as you walk through a door, such as a raised step or threshold.  Trim any bushes or trees on the path to your home.  Use bright outdoor lighting.  Clear any walking paths of anything that might make someone trip, such as rocks or tools.  Regularly check to see if handrails are loose or broken. Make sure that both sides of any steps have handrails.  Any raised  decks and porches should have guardrails on the edges.  Have any leaves, snow, or ice cleared regularly.  Use sand or salt on walking paths during winter.  Clean up any spills in your garage right away. This includes oil or grease spills. What can I do in the bathroom?  Use night lights.  Install grab bars by the toilet and in the tub and shower. Do not use towel bars as grab bars.  Use non-skid mats or decals in the tub or shower.  If you need to sit down in the shower, use a plastic, non-slip stool.  Keep the floor dry. Clean up any water that spills on the floor as soon as it happens.  Remove soap buildup in the tub or shower regularly.  Attach bath mats securely with double-sided non-slip rug tape.  Do not have throw rugs and other things on the floor that can make you trip. What can I do in the bedroom?  Use night lights.  Make sure that you have a light by your bed that is easy to reach.  Do not use any sheets or blankets that are too big for your bed. They should not hang down onto the floor.  Have a firm chair that has side arms. You can use this for  support while you get dressed.  Do not have throw rugs and other things on the floor that can make you trip. What can I do in the kitchen?  Clean up any spills right away.  Avoid walking on wet floors.  Keep items that you use a lot in easy-to-reach places.  If you need to reach something above you, use a strong step stool that has a grab bar.  Keep electrical cords out of the way.  Do not use floor polish or wax that makes floors slippery. If you must use wax, use non-skid floor wax.  Do not have throw rugs and other things on the floor that can make you trip. What can I do with my stairs?  Do not leave any items on the stairs.  Make sure that there are handrails on both sides of the stairs and use them. Fix handrails that are broken or loose. Make sure that handrails are as long as the stairways.  Check  any carpeting to make sure that it is firmly attached to the stairs. Fix any carpet that is loose or worn.  Avoid having throw rugs at the top or bottom of the stairs. If you do have throw rugs, attach them to the floor with carpet tape.  Make sure that you have a light switch at the top of the stairs and the bottom of the stairs. If you do not have them, ask someone to add them for you. What else can I do to help prevent falls?  Wear shoes that:  Do not have high heels.  Have rubber bottoms.  Are comfortable and fit you well.  Are closed at the toe. Do not wear sandals.  If you use a stepladder:  Make sure that it is fully opened. Do not climb a closed stepladder.  Make sure that both sides of the stepladder are locked into place.  Ask someone to hold it for you, if possible.  Clearly mark and make sure that you can see:  Any grab bars or handrails.  First and last steps.  Where the edge of each step is.  Use tools that help you move around (mobility aids) if they are needed. These include:  Canes.  Walkers.  Scooters.  Crutches.  Turn on the lights when you go into a dark area. Replace any light bulbs as soon as they burn out.  Set up your furniture so you have a clear path. Avoid moving your furniture around.  If any of your floors are uneven, fix them.  If there are any pets around you, be aware of where they are.  Review your medicines with your doctor. Some medicines can make you feel dizzy. This can increase your chance of falling. Ask your doctor what other things that you can do to help prevent falls. This information is not intended to replace advice given to you by your health care provider. Make sure you discuss any questions you have with your health care provider. Document Released: 04/16/2009 Document Revised: 11/26/2015 Document Reviewed: 07/25/2014 Elsevier Interactive Patient Education  2017 [employer].

## 2019-11-21 NOTE — Progress Notes (Signed)
Subjective:    Patient ID: Pamela Rich, female    DOB: July 06, 1945, 74 y.o.   MRN: [id number]  HPI The patient is here for an acute visit.   Left arm pain - Her symptoms started at least two months ago.  She denies any injury.   she has been having left arm pain and at times it feels weak.  She has pain in the middle of the night - it will wakes her up.  She does tend to sleep on her left side.  The pain is primarily around the posterior left elbow region.  It is worse when she lays on it.  She picked up a box yesterday and that caused pain.  She has tried tylenol and Advil - the Advil helps.    She denies numbness/tingling or swelling.  She denies radiation of the pain.  She denies shoulder pain or neck pain.       Medications and allergies reviewed with patient and updated if appropriate.  Patient Active Problem List   Diagnosis Date Noted  . PAD (peripheral artery disease) ([place]) 04/01/2019  . De Quervain's syndrome (tenosynovitis) 03/12/2018  . Hearing loss, wears hearing aids 06/22/2017  . Radicular pain of left lower extremity 02/28/2017  . Chronic pain of both knees 06/21/2016  . Mild intermittent asthma 07/20/2015  . Other allergic rhinitis 07/20/2015  . Mixed hyperlipidemia 03/01/2012  . Migraine without aura 04/05/2010  . Vitamin D deficiency 03/19/2009  . GERD (gastroesophageal reflux disease) 07/08/2008  . DEGENERATIVE JOINT DISEASE 12/05/2007  . Hypothyroidism 01/22/2007  . IBS 09/28/2006  . Osteopenia 09/28/2006    Current Outpatient Medications on File Prior to Visit  Medication Sig Dispense Refill  . albuterol (PROAIR HFA) 108 (90 Base) MCG/ACT inhaler INHALE 2 PUFFS EVERY 4 HOURS AS NEEDED FOR WHEEZING OR COUGHING SPELLS 18 g 2  . Ascorbic Acid (VITAMIN C) 1000 MG tablet Take 1,000 mg by mouth daily.      Marland Kitchen aspirin 81 MG tablet Take 81 mg by mouth daily.    Marland Kitchen azelastine (ASTELIN) 0.1 % nasal spray Use in each nostril as directed 30 mL 5  .  Cholecalciferol (VITAMIN D3) 1000 UNITS CAPS Take by mouth daily.      . fluticasone (FLONASE) 50 MCG/ACT nasal spray SPRAY 2 SPRAYS INTO EACH NOSTRIL EVERY DAY 48 mL 1  . fluticasone (FLOVENT HFA) 110 MCG/ACT inhaler Inhale 2 puffs into the lungs 2 (two) times daily. Use with spacer. 1 Inhaler 5  . fluticasone (FLOVENT HFA) 44 MCG/ACT inhaler Inhale 2 puffs into the lungs 2 (two) times daily. [id number] g 5  . folic acid (FOLVITE) 1 MG tablet Take 1 mg by mouth daily.    Marland Kitchen levothyroxine (SYNTHROID) 50 MCG tablet TAKE 2 TABLETS (100 MCG TOTAL) BY MOUTH DAILY. 180 tablet 1  . Multiple Vitamin (MULTIVITAMIN) tablet Take by mouth.    . pravastatin (PRAVACHOL) 20 MG tablet Take 1 tablet (20 mg total) by mouth daily. 90 tablet 1   No current facility-administered medications on file prior to visit.    Past Medical History:  Diagnosis Date  . Allergy    SEASONAL  . Fibroid    no surgery  . Heart murmur   . Hyperlipidemia   . Hypothyroidism   . Migraines   . Mild intermittent asthma 07/20/2015   pt is currently not taking any medications  . Osteopenia   . Sciatic nerve lesion 2018  . Vitamin D deficiency  Past Surgical History:  Procedure Laterality Date  . cortisone injections     in left hand  . DENTAL SURGERY    . DORSAL COMPARTMENT RELEASE Right 03/20/2018   Procedure: RIGHT OPEN  RELEASE DORSAL COMPARTMENT (DEQUERVAINS);  Surgeon: Daryll Brod, MD;  Location: [facility];  Service: Orthopedics;  Laterality: Right;  . LUMBAR FUSION     Dr Hal Neer  . POLYPECTOMY     uterine  . SEPTOPLASTY    . SINOSCOPY    . TUBAL LIGATION    . WRIST ARTHROSCOPY WITH FOVEAL TRIANGULAR FIBROCARTILAGE COMPLEX REPAIR Right 03/20/2018   Procedure: RIGHT WRIST ARTHROSCOPY WITH DEBRIDEMENT, SCAPHOLUNATE/TRIANGULAR FIBROCARTILAGE COMPLEX REPAIR;  Surgeon: Daryll Brod, MD;  Location: [facility];  Service: Orthopedics;  Laterality: Right;    Social History   Socioeconomic  History  . Marital status: Widowed    Spouse name: Not on file  . Number of children: 3  . Years of education: Not on file  . Highest education level: Not on file  Occupational History  . Occupation: retired  Tobacco Use  . Smoking status: Former Smoker    Packs/day: 0.30    Years: 40.00    Pack years: 12.00    Types: Cigarettes    Quit date: 07/04/1974    Years since quitting: 45.4  . Smokeless tobacco: Never Used  . Tobacco comment: smoked 1965-1976 , < 3 cig/ day  Substance and Sexual Activity  . Alcohol use: No    Alcohol/week: 0.0 standard drinks    Comment: rarely  . Drug use: No  . Sexual activity: Not Currently    Partners: Male    Birth control/protection: Surgical, Post-menopausal    Comment: BTL  Other Topics Concern  . Not on file  Social History Narrative   Exercise: walking, stretching, weights   Social Determinants of Health   Financial Resource Strain:   . Difficulty of Paying Living Expenses:   Food Insecurity:   . Worried About Charity fundraiser in the Last Year:   . Arboriculturist in the Last Year:   Transportation Needs:   . Film/video editor (Medical):   Marland Kitchen Lack of Transportation (Non-Medical):   Physical Activity:   . Days of Exercise per Week:   . Minutes of Exercise per Session:   Stress:   . Feeling of Stress :   Social Connections:   . Frequency of Communication with Friends and Family:   . Frequency of Social Gatherings with Friends and Family:   . Attends Religious Services:   . Active Member of Clubs or Organizations:   . Attends Archivist Meetings:   Marland Kitchen Marital Status:     Family History  Problem Relation Age of Onset  . Hypertension Mother   . Heart disease Mother        CHF; AS  . Osteoporosis Mother   . Heart attack Mother 7  . Allergic rhinitis Mother   . Diabetes Father   . Stroke Father        onset in 32s  . Cancer Brother        malignant brain tumor  . Colon cancer Neg Hx   . Esophageal cancer  Neg Hx   . Rectal cancer Neg Hx   . Stomach cancer Neg Hx   . Angioedema Neg Hx   . Asthma Neg Hx   . Eczema Neg Hx   . Immunodeficiency Neg Hx   . Urticaria Neg  Hx   . Breast cancer Neg Hx     Review of Systems Per HPI    Objective:   Vitals:   11/22/19 1034  BP: 136/80  Pulse: 68  Resp: 16  Temp: 98.8 F (37.1 C)  SpO2: 98%   BP Readings from Last 3 Encounters:  11/22/19 136/80  10/14/19 128/70  09/20/19 118/80   Wt Readings from Last 3 Encounters:  11/22/19 150 lb (68 kg)  10/14/19 153 lb 3.2 oz (69.5 kg)  09/20/19 150 lb 9.6 oz (68.3 kg)   Body mass index is 26.57 kg/m.   Physical Exam Constitutional:      General: She is not in acute distress.    Appearance: Normal appearance. She is not ill-appearing.  HENT:     Head: Normocephalic and atraumatic.  Musculoskeletal:     Comments: No left shoulder pain with palpation or movement.  Normal ROM.  Left medial epicondyle with mild pain with palpation.  No swelling, deformity.  No tenderness elsewhere in elbow region.  Some pain with movement.    Skin:    General: Skin is warm and dry.     Findings: No bruising, erythema, lesion or rash.  Neurological:     Mental Status: She is alert.     Sensory: No sensory deficit.     Motor: No weakness.            Assessment & Plan:    See Problem List for Assessment and Plan of chronic medical problems.    This visit occurred during the SARS-CoV-2 public health emergency.  Safety protocols were in place, including screening questions prior to the visit, additional usage of staff PPE, and extensive cleaning of exam room while observing appropriate contact time as indicated for disinfecting solutions.

## 2019-11-22 ENCOUNTER — Other Ambulatory Visit: Payer: Self-pay

## 2019-11-22 ENCOUNTER — Ambulatory Visit: Payer: Medicare PPO | Admitting: Internal Medicine

## 2019-11-22 ENCOUNTER — Encounter: Payer: Self-pay | Admitting: Internal Medicine

## 2019-11-22 VITALS — BP 136/80 | HR 68 | Temp 98.8°F | Resp 16 | Ht 63.0 in | Wt 150.0 lb

## 2019-11-22 DIAGNOSIS — M7702 Medial epicondylitis, left elbow: Secondary | ICD-10-CM | POA: Diagnosis not present

## 2019-11-22 NOTE — Assessment & Plan Note (Signed)
Acute Left elbow pain - likely medial epicondylitis Advised voltaren gel 4 times a day, ice, can take advil but take with food and do not take long  Revise activities Referred to sports medicine

## 2019-11-22 NOTE — Patient Instructions (Addendum)
Use voltaren gel 4 times a day on your left elbow.  Ice if inflamed.  Revise your activities.  Take advil as needed.    Make an appointment with sports medicine for further evaluation/ treatment.      Golfer's Elbow  Golfer's elbow, also called medial epicondylitis, is a condition that results from inflammation of the strong bands of tissue (tendons) that attach your forearm muscles to the inside of your bone at the elbow. These tendons affect the muscles that bend the palm toward the wrist (flexion). The tendons become less flexible with age. This condition is called golfer's elbow because it is more common among people who constantly bend and twist their wrists, such as golfers. This injury is usually caused by overuse. What are the causes? This condition is caused by:  Repeatedly flexing, turning, or twisting your wrist.  Constantly gripping objects with your hands. What increases the risk? This condition is more likely to develop in people who play golf, baseball, or tennis. This injury is more common among people who have jobs that require the constant use of their hands, such as:  Carpenters.  Butchers.  Musicians.  Typists. What are the signs or symptoms? This condition causes elbow pain that may spread to your forearm and upper arm. Symptoms of this condition include.  Pain at the inner elbow, forearm, or wrist.  Reduced grip strength. The pain may get worse when you bend your wrist downward. How is this diagnosed? This condition is diagnosed based on your symptoms, medical history, and a physical exam. During the exam, your health care provider may:  Test your grip strength.  Move your wrist to check for pain. You may also have an MRI to:  Confirm the diagnosis.  Look for other issues.  Check for tears in the ligaments, muscles, or tendons. How is this treated? Treatment for this condition includes:  Stopping all activities that make you bend or twist your  elbow or wrist and waiting until your pain and other symptoms go away before resuming those activities.  Wearing an elbow brace or wrist splint to restrict the movements that cause symptoms.  Icing your inner elbow, forearm, or wrist to relieve pain.  Taking NSAIDs or getting corticosteroid injections to reduce pain and swelling.  Doing stretching, range-of-motion, and strengthening exercises (physical therapy) as told by your health care provider. In rare cases, surgery may be needed if your condition does not improve. Follow these instructions at home: If you have a brace or splint:  Wear it as told by your health care provider.  Loosen it if your fingers tingle, become numb, or turn cold and blue.  Keep it clean. Managing pain, stiffness, and swelling   If directed, put ice on the injured area. ? Put ice in a plastic bag. ? Place a towel between your skin and the bag. ? Leave the ice on for 20 minutes, 2-3 times a day.  Move your fingers often to avoid stiffness.  Raise (elevate) the injured area above the level of your heart while you are sitting or lying down. Activity  Rest your injured area as told by your health care provider.  Return to your normal activities as told by your health care provider. Ask your health care provider what activities are safe for you.  Do exercises as told by your health care provider. Lifestyle  If your condition is caused by sports, work with a trainer to make sure that you: ? Have the correct technique. ?  Are using the proper equipment.  If your condition is work related, talk with your employer about changes that can be made. General instructions  Take over-the-counter and prescription medicines only as told by your health care provider.  Do not use any products that contain nicotine or tobacco, such as cigarettes, e-cigarettes, and chewing tobacco. If you need help quitting, ask your health care provider.  Keep all follow-up  visits as told by your health care provider. This is important. How is this prevented?  Before and after activity: ? Warm up and stretch before being active. ? Cool down and stretch after being active. ? Give your body time to rest between periods of activity.  During activity: ? Make sure to use equipment that fits you. ? If you play golf, slow your golf swing to reduce shock in the arm when making contact with the ball.  Maintain physical fitness, including: ? Strength. ? Flexibility. ? Cardiovascular fitness. ? Endurance.  Do exercises to strengthen the forearm muscles. Contact a health care provider if:  Your pain does not improve or it gets worse.  You notice numbness in your hand. Get help right away if:  Your pain is severe.  You cannot move your wrist. Summary  Golfer's elbow, also called medial epicondylitis, is a condition that results from inflammation of the strong bands of tissue (tendons) that attach your forearm muscles to the inside of your bone at the elbow.  This injury usually results from overuse.  Symptoms of this condition include decreased grip strength and pain at the inner elbow, forearm, or wrist.  This injury is treated with rest, ice, medicines, physical therapy, and surgery as needed. This information is not intended to replace advice given to you by your health care provider. Make sure you discuss any questions you have with your health care provider. Document Revised: 10/11/2018 Document Reviewed: 04/26/2018 Elsevier Patient Education  2020 [employer].

## 2019-11-29 ENCOUNTER — Encounter: Payer: Self-pay | Admitting: Family Medicine

## 2019-11-29 ENCOUNTER — Ambulatory Visit: Payer: Self-pay

## 2019-11-29 ENCOUNTER — Ambulatory Visit: Payer: Medicare PPO | Admitting: Family Medicine

## 2019-11-29 ENCOUNTER — Other Ambulatory Visit: Payer: Self-pay

## 2019-11-29 VITALS — BP 120/84 | HR 75 | Ht 63.0 in | Wt 149.4 lb

## 2019-11-29 DIAGNOSIS — M79622 Pain in left upper arm: Secondary | ICD-10-CM

## 2019-11-29 DIAGNOSIS — M7542 Impingement syndrome of left shoulder: Secondary | ICD-10-CM

## 2019-11-29 NOTE — Progress Notes (Signed)
    Subjective:    CC: L arm pain  I, Molly Weber, LAT, ATC, am serving as scribe for Dr. Lynne Leader.  HPI: Pt is a 74 y/o female presenting w/ c/o L arm pain x several months w/ no known MOI.  She locates her pain to her L posterior to lateral upper arm.  She rates her pain as mild-mod and describes her pain as nagging.  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   Radiating pain: yes in her L upper arm L elbow swelling: yes, L lateral epicondyle;  L UE weakness: yes, particularly when driving Aggravating factors: L sidelying and picking up heavier items Treatments tried: Tylenol, IBU; Voltaren gel  Pertinent review of Systems: No fever or chills.   Relevant historical information: GERD IBS hyperlipidemia asthma   Objective:    Vitals:   11/29/19 1029  BP: 120/84  Pulse: 75  SpO2: 98%   General: Well Developed, well nourished, and in no acute distress.   MSK: C-spine normal.  Normal motion nontender Upper extremity strength intact except noted below. Left shoulder normal-appearing nontender normal motion. Strength reduced abduction 4/5 normal external anterior motion. Positive Hawkins and Neer's test positive empty can test negative Yergason's and speeds test. Left elbow normal-appearing nontender normal motion normal strength. Hand and wrist normal-appearing nontender normal motion and strength.  Resisted extension and flexion hand and wrist does not reproduce elbow pain.  Lab and Radiology Results Diagnostic Limited MSK Ultrasound of: Left shoulder Biceps tendon normal-appearing intact in bicipital groove. Subscapularis tendon intact normal-appearing Supraspinatus tendon is intact. Increased subacromial bursa thickness present. Infraspinatus tendon normal-appearing AC joint degenerative with effusion Impression: Subacromial bursitis and AC  DJD     Impression and Recommendations:    Assessment and Plan: 74 y.o. female with left upper arm pain is consistent with referred pain from rotator cuff impingement or bursitis.  Doubtful for elbow pathology or radiculopathy.  Plan for physical therapy and Voltaren gel.  Check back in 4 to 6 weeks if not better would consider injection at that point.Marland Kitchen  PDMP not reviewed this encounter. Orders Placed This Encounter  Procedures  . [place] LIMITED JOINT SPACE STRUCTURES UP LEFT(NO LINKED CHARGES)    Order Specific Question:   Reason for Exam (SYMPTOM  OR DIAGNOSIS REQUIRED)    Answer:   L arm pain    Order Specific Question:   Preferred imaging location?    Answer:   [facility]  . Ambulatory referral to Physical Therapy    Referral Priority:   Routine    Referral Type:   Physical Medicine    Referral Reason:   Specialty Services Required    Requested Specialty:   Physical Therapy   No orders of the defined types were placed in this encounter.   Discussed warning signs or symptoms. Please see discharge instructions. Patient expresses understanding.   The above documentation has been reviewed and is accurate and complete Lynne Leader, M.D.

## 2019-11-29 NOTE — Patient Instructions (Addendum)
Thank you for coming in today. Plan for PT.  Continue voltaren gel.  Recheck in 4-6 weeks if not improving.  We could do an injection if needed.    Shoulder Impingement Syndrome  Shoulder impingement syndrome is a condition that causes pain when connective tissues (tendons) surrounding the shoulder joint become pinched. These tendons are part of the group of muscles and tissues that help to stabilize the shoulder (rotator cuff). Beneath the rotator cuff is a fluid-filled sac (bursa) that allows the muscles and tendons to glide smoothly. The bursa may become swollen or irritated (bursitis). Bursitis, swelling in the rotator cuff tendons, or both conditions can decrease how much space is under a bone in the shoulder joint (acromion), resulting in impingement. What are the causes? Shoulder impingement syndrome may be caused by bursitis or swelling of the rotator cuff tendons, which may result from:  Repetitive overhead arm movements.  Falling onto the shoulder.  Weakness in the shoulder muscles. What increases the risk? You may be more likely to develop this condition if you:  Play sports that involve throwing, such as baseball.  Participate in sports such as tennis, volleyball, and swimming.  Work as a Curator, Games developer, or Architect. Some people are also more likely to develop impingement syndrome because of the shape of their acromion bone. What are the signs or symptoms? The main symptom of this condition is pain on the front or side of the shoulder. The pain may:  Get worse when lifting or raising the arm.  Get worse at night.  Wake you up from sleeping.  Feel sharp when the shoulder is moved and then fade to an ache. Other symptoms may include:  Tenderness.  Stiffness.  Inability to raise the arm above shoulder level or behind the body.  Weakness. How is this diagnosed? This condition may be diagnosed based on:  Your symptoms and medical history.  A physical  exam.  Imaging tests, such as: ? X-rays. ? MRI. ? Ultrasound. How is this treated? This condition may be treated by:  Resting your shoulder and avoiding all activities that cause pain or put stress on the shoulder.  Icing your shoulder.  NSAIDs to help reduce pain and swelling.  One or more injections of medicines to numb the area and reduce inflammation.  Physical therapy.  Surgery. This may be needed if nonsurgical treatments have not helped. Surgery may involve repairing the rotator cuff, reshaping the acromion, or removing the bursa. Follow these instructions at home: Managing pain, stiffness, and swelling   If directed, put ice on the injured area. ? Put ice in a plastic bag. ? Place a towel between your skin and the bag. ? Leave the ice on for 20 minutes, 2-3 times a day. Activity  Rest and return to your normal activities as told by your health care provider. Ask your health care provider what activities are safe for you.  Do exercises as told by your health care provider. General instructions  Do not use any products that contain nicotine or tobacco, such as cigarettes, e-cigarettes, and chewing tobacco. These can delay healing. If you need help quitting, ask your health care provider.  Ask your health care provider when it is safe for you to drive.  Take over-the-counter and prescription medicines only as told by your health care provider.  Keep all follow-up visits as told by your health care provider. This is important. How is this prevented?  Give your body time to rest between periods  of activity.  Be safe and responsible while being active. This will help you avoid falls.  Maintain physical fitness, including strength and flexibility. Contact a health care provider if:  Your symptoms have not improved after 1-2 months of treatment and rest.  You cannot lift your arm away from your body. Summary  Shoulder impingement syndrome is a condition that  causes pain when connective tissues (tendons) surrounding the shoulder joint become pinched.  The main symptom of this condition is pain on the front or side of the shoulder.  This condition is usually treated with rest, ice, and pain medicines as needed. This information is not intended to replace advice given to you by your health care provider. Make sure you discuss any questions you have with your health care provider. Document Revised: 10/12/2018 Document Reviewed: 12/13/2017 Elsevier Patient Education  2020 [employer].

## 2020-01-07 ENCOUNTER — Encounter: Payer: Self-pay | Admitting: Physical Therapy

## 2020-01-07 ENCOUNTER — Ambulatory Visit: Payer: Medicare PPO | Attending: Family Medicine | Admitting: Physical Therapy

## 2020-01-07 ENCOUNTER — Other Ambulatory Visit: Payer: Self-pay

## 2020-01-07 DIAGNOSIS — M25512 Pain in left shoulder: Secondary | ICD-10-CM | POA: Diagnosis not present

## 2020-01-07 DIAGNOSIS — M25561 Pain in right knee: Secondary | ICD-10-CM | POA: Diagnosis not present

## 2020-01-07 DIAGNOSIS — G8929 Other chronic pain: Secondary | ICD-10-CM | POA: Diagnosis not present

## 2020-01-07 DIAGNOSIS — M6281 Muscle weakness (generalized): Secondary | ICD-10-CM

## 2020-01-07 NOTE — Patient Instructions (Signed)
Access Code: [email or web address] URL: [email or web address] Date: 01/07/2020 Prepared by: Almyra Free  Exercises Standing Shoulder Internal Rotation Stretch Behind Back - 2 x daily - 7 x weekly - 1 sets - 3 reps - 30 seconds hold Standing Scapular Retraction - 3 x daily - 7 x weekly - 1 sets - 10 reps - 3-5 sec hold Shoulder External Rotation with Anchored Resistance - 1 x daily - 7 x weekly - 1-3 sets - 10 reps

## 2020-01-07 NOTE — Therapy (Signed)
[facility] [address], [place], [place], [address] Phone: [phone]   Fax:  [phone]  Physical Therapy Evaluation  Patient Details  Name: Pamela Rich MRN: [id number] Date of Birth: 11-09-1945 Referring Provider (PT): Lynne Leader, MD   Encounter Date: 01/07/2020   PT End of Session - 01/07/20 1019    Visit Number 1    Date for PT Re-Evaluation 03/03/20    Authorization Type [employer]    PT Start Time 6962    PT Stop Time 1058    PT Time Calculation (min) 43 min    Activity Tolerance Patient tolerated treatment well    Behavior During Therapy [facility] for tasks assessed/performed           Past Medical History:  Diagnosis Date  . Allergy    SEASONAL  . Fibroid    no surgery  . Heart murmur   . Hyperlipidemia   . Hypothyroidism   . Migraines   . Mild intermittent asthma 07/20/2015   pt is currently not taking any medications  . Osteopenia   . Sciatic nerve lesion 2018  . Vitamin D deficiency     Past Surgical History:  Procedure Laterality Date  . cortisone injections     in left hand  . DENTAL SURGERY    . DORSAL COMPARTMENT RELEASE Right 03/20/2018   Procedure: RIGHT OPEN  RELEASE DORSAL COMPARTMENT (DEQUERVAINS);  Surgeon: Daryll Brod, MD;  Location: [place];  Service: Orthopedics;  Laterality: Right;  . LUMBAR FUSION     Dr Hal Neer  . POLYPECTOMY     uterine  . SEPTOPLASTY    . SINOSCOPY    . TUBAL LIGATION    . WRIST ARTHROSCOPY WITH FOVEAL TRIANGULAR FIBROCARTILAGE COMPLEX REPAIR Right 03/20/2018   Procedure: RIGHT WRIST ARTHROSCOPY WITH DEBRIDEMENT, SCAPHOLUNATE/TRIANGULAR FIBROCARTILAGE COMPLEX REPAIR;  Surgeon: Daryll Brod, MD;  Location: [place];  Service: Orthopedics;  Laterality: Right;    There were no vitals filed for this visit.    Subjective Assessment - 01/07/20 1019    Subjective Patient began having left shoulder pain when  driving starting over a year ago. She has pain after about 30 min. She also reprorts weakness and has to take her hand off the wheel. She also had pain at night lying on it. Patient also has right knee pain and is unable to kneel on her knee and has pain with steps. The voltarin gel has helped both. The knee feels tight and unstable.    Pertinent History osteopenia, migraines, HOH    Limitations Other (comment)   driving   Diagnostic tests [place] - AC DJD, subacromial bursitis    Patient Stated Goals to get rid of pain and get stronger    Currently in Pain? Yes    Pain Score 7     Pain Location Shoulder    Pain Orientation Left    Pain Descriptors / Indicators Aching    Pain Type Chronic pain    Pain Onset More than a month ago    Pain Frequency Intermittent    Aggravating Factors  prolonged flexion    Pain Relieving Factors bringing arm to side    Multiple Pain Sites Yes    Pain Score 10    Pain Location Knee    Pain Orientation Right    Pain Descriptors / Indicators Sharp    Pain Type Acute pain    Pain Onset In the past 7  days    Pain Frequency Intermittent    Aggravating Factors  kneeling, stairs    Effect of Pain on Daily Activities avoids              [facility] PT Assessment - 01/07/20 0001      Assessment   Medical Diagnosis left upper arm pain; left impingement syndrome    Referring Provider (PT) Lynne Leader, MD    Onset Date/Surgical Date 01/02/19    Hand Dominance Right    Next MD Visit no      Precautions   Precautions None      Restrictions   Weight Bearing Restrictions No      Balance Screen   Has the patient fallen in the past 6 months No    Has the patient had a decrease in activity level because of a fear of falling?  No    Is the patient reluctant to leave their home because of a fear of falling?  No      Home Environment   Living Environment Private residence    Living Arrangements Alone    Type of [place]    Additional Comments one set of stairs to  bonus room      Prior Function   Level of [place] [employer]      Observation/Other Assessments   Focus on Therapeutic Outcomes (FOTO)  37% limited      Posture/Postural Control   Posture Comments rounded shoulders, forward head      ROM / Strength   AROM / PROM / Strength AROM;PROM;Strength      AROM   AROM Assessment Site Shoulder    Right/Left Shoulder Left    Left Shoulder Flexion 155 Degrees    Left Shoulder ABduction 154 Degrees   some pain   Left Shoulder Internal Rotation 64 Degrees   functional reach behind back but painful; equal to right   Left Shoulder External Rotation 68 Degrees      PROM   PROM Assessment Site Shoulder    Right/Left Shoulder Left    Left Shoulder Flexion 177 Degrees    Left Shoulder ABduction 175 Degrees    Left Shoulder Internal Rotation 90 Degrees    Left Shoulder External Rotation 72 Degrees   pain with overpressure     Strength   Strength Assessment Site Shoulder    Right/Left Shoulder Left    Left Shoulder Flexion 4/5    Left Shoulder Extension 4+/5    Left Shoulder ABduction 4/5    Left Shoulder Internal Rotation 4+/5    Left Shoulder External Rotation 4+/5    Left Shoulder Horizontal ABduction --   4+   Left Shoulder Horizontal ADduction 5/5      Palpation   Palpation comment painful in left pectoralis, left subscap and lateral scapular muscles; tight in bil UT      Special Tests    Special Tests Rotator Cuff Impingement    Rotator Cuff Impingment tests Neer impingement test;Hawkins- Kennedy test;Lift- off test      Neer Impingement test    Findings Negative    Side Left      Hawkins-Kennedy test   Findings Positive    Side Left      Lift-Off test   Findings Positive    Side Left                      Objective measurements completed on examination: See above  findings.               PT Education - 01/07/20 1102    Education Details HEP    Person(s) Educated  Patient    Methods Explanation;Demonstration;Handout    Comprehension Verbalized understanding;Returned demonstration            PT Short Term Goals - 01/07/20 1627      PT SHORT TERM GOAL #1   Title Ind with initial HEP    Time 2    Period Weeks    Status New    Target Date 01/21/20             PT Long Term Goals - 01/07/20 1627      PT LONG TERM GOAL #1   Title Patient able to drive without having to remove left arm from wheel due to pain or weakness.    Time 8    Period Weeks    Status New    Target Date 03/03/20      PT LONG TERM GOAL #2   Title Patient able to sleep without waking from pain in the left shoulder.    Time 8    Period Weeks    Status New      PT LONG TERM GOAL #3   Title Patient to demo 5/5 left shoulder strength to normalize driving and ADLS.    Time 8    Period Weeks    Status New      PT LONG TERM GOAL #4   Title Improved FOTO limitations to <= 33%    Baseline baseline 37%                  Plan - 01/07/20 1101    Clinical Impression Statement Patient presents with c/o left shoulder pain and weakness beginning over a year ago. Pain occurs with driving and causes pt to have to put her hand in her lap. She also feels pain when she sleeps on her left side. Additionally patient c/o right knee pain beginning in the past weeks and limiting her from kneeling when doing her daily exercises and causing pain with steps. She states the knee is worse than the shoulder at this point. Other than pain assessment, I did not have time to assess the knee and will do at next visit with PT. Her left shoulder has limitations in both strength and ROM. She has pain with end range ABD and with resisted IR. She has tenderness and tightness in her pectorals, lats and subscapularis. She will benefit from PT to address these deficits and return her to her PLOF.    Personal Factors and Comorbidities Comorbidity 3+    Comorbidities osteopenia, migraines, HOH     Examination-Activity Limitations Reach Overhead;Carry;Sleep;Stairs    Stability/Clinical Decision Making Stable/Uncomplicated    Clinical Decision Making Low    Rehab Potential Excellent    PT Frequency 2x / week    PT Duration 8 weeks    PT Treatment/Interventions ADLs/Self Care Home Management;Cryotherapy;Electrical Stimulation;Iontophoresis 4mg /ml Dexamethasone;Moist Heat;Neuromuscular re-education;Therapeutic exercise;Therapeutic activities;Stair training;Patient/family education;Manual techniques;Dry needling;Taping;Spinal Manipulations;Joint Manipulations    PT Next Visit Plan Assess right knee and add goals as indicated; DN to left shoulder (UT, lats, pecs, subscap); left shoulder strength    PT Home Exercise Plan [email or web address]    Consulted and Agree with Plan of Care Patient           Patient will benefit from skilled therapeutic intervention in order to improve the following deficits  and impairments:  Decreased range of motion, Pain, Impaired flexibility, Postural dysfunction, Decreased strength, Impaired UE functional use, Increased muscle spasms  Visit Diagnosis: Chronic left shoulder pain - Plan: PT plan of care cert/re-cert  Muscle weakness (generalized) - Plan: PT plan of care cert/re-cert  Acute pain of right knee - Plan: PT plan of care cert/re-cert     Problem List Patient Active Problem List   Diagnosis Date Noted  . Medial epicondylitis of elbow, left 11/22/2019  . PAD (peripheral artery disease) ([place]) 04/01/2019  . De Quervain's syndrome (tenosynovitis) 03/12/2018  . Hearing loss, wears hearing aids 06/22/2017  . Radicular pain of left lower extremity 02/28/2017  . Chronic pain of both knees 06/21/2016  . Mild intermittent asthma 07/20/2015  . Other allergic rhinitis 07/20/2015  . Mixed hyperlipidemia 03/01/2012  . Migraine without aura 04/05/2010  . Vitamin D deficiency 03/19/2009  . GERD (gastroesophageal reflux disease) 07/08/2008  . DEGENERATIVE JOINT  DISEASE 12/05/2007  . Hypothyroidism 01/22/2007  . IBS 09/28/2006  . Osteopenia 09/28/2006   Madelyn Flavors PT 01/07/2020, 4:34 PM  [facility] [address], [place], [place], [address] Phone: [phone]   Fax:  [phone]  Name: Pamela Rich MRN: [id number] Date of Birth: 1945-07-26

## 2020-01-21 ENCOUNTER — Ambulatory Visit: Payer: Medicare PPO | Admitting: Physical Therapy

## 2020-01-21 ENCOUNTER — Encounter: Payer: Self-pay | Admitting: Physical Therapy

## 2020-01-21 ENCOUNTER — Other Ambulatory Visit: Payer: Self-pay

## 2020-01-21 DIAGNOSIS — M25512 Pain in left shoulder: Secondary | ICD-10-CM | POA: Diagnosis not present

## 2020-01-21 DIAGNOSIS — M25561 Pain in right knee: Secondary | ICD-10-CM | POA: Diagnosis not present

## 2020-01-21 DIAGNOSIS — G8929 Other chronic pain: Secondary | ICD-10-CM | POA: Diagnosis not present

## 2020-01-21 DIAGNOSIS — M6281 Muscle weakness (generalized): Secondary | ICD-10-CM

## 2020-01-21 NOTE — Therapy (Signed)
[facility] [address] [place], [place], [address] Phone: [phone]   Fax:  [phone]  Physical Therapy Treatment  Patient Details  Name: Pamela Rich MRN: [id number] Date of Birth: 1945/09/27 Referring Provider (PT): Lynne Leader, MD   Encounter Date: 01/21/2020   PT End of Session - 01/21/20 1525    Visit Number 2    Date for PT Re-Evaluation 03/03/20    Authorization Type [employer]    Authorization Time Period 01/07/20-03/03/20    Authorization - Visit Number 2    Authorization - Number of Visits 12    Progress Note Due on Visit 10           Past Medical History:  Diagnosis Date  . Allergy    SEASONAL  . Fibroid    no surgery  . Heart murmur   . Hyperlipidemia   . Hypothyroidism   . Migraines   . Mild intermittent asthma 07/20/2015   pt is currently not taking any medications  . Osteopenia   . Sciatic nerve lesion 2018  . Vitamin D deficiency     Past Surgical History:  Procedure Laterality Date  . cortisone injections     in left hand  . DENTAL SURGERY    . DORSAL COMPARTMENT RELEASE Right 03/20/2018   Procedure: RIGHT OPEN  RELEASE DORSAL COMPARTMENT (DEQUERVAINS);  Surgeon: Daryll Brod, MD;  Location: [place];  Service: Orthopedics;  Laterality: Right;  . LUMBAR FUSION     Dr Hal Neer  . POLYPECTOMY     uterine  . SEPTOPLASTY    . SINOSCOPY    . TUBAL LIGATION    . WRIST ARTHROSCOPY WITH FOVEAL TRIANGULAR FIBROCARTILAGE COMPLEX REPAIR Right 03/20/2018   Procedure: RIGHT WRIST ARTHROSCOPY WITH DEBRIDEMENT, SCAPHOLUNATE/TRIANGULAR FIBROCARTILAGE COMPLEX REPAIR;  Surgeon: Daryll Brod, MD;  Location: [place];  Service: Orthopedics;  Laterality: Right;    There were no vitals filed for this visit.   Subjective Assessment - 01/21/20 1524    Subjective Shoulder is feeling better with the exercises, no pain, but feel [place].  Pt is planning to check and see if she can get knee added to order.    Currently in Pain? No/denies                             [facility] Adult PT Treatment/Exercise - 01/21/20 0001      Exercises   Exercises Shoulder      Shoulder Exercises: Supine   Other Supine Exercises diagonals red band - 10x      Shoulder Exercises: Seated   External Rotation Strengthening;Both;15 reps;Theraband   red   Other Seated Exercises scap squeezes    Other Seated Exercises shoulder flexion cues for scap stability; UT stretch 2 x 30 sec      Manual Therapy   Manual Therapy Joint mobilization;Soft tissue mobilization    Joint Mobilization AP GHJ grade III 5 bouts of 20 sec    Soft tissue mobilization ant delt, UT, pecs                    PT Short Term Goals - 01/07/20 1627      PT SHORT TERM GOAL #1   Title Ind with initial HEP    Time 2    Period Weeks    Status New  Target Date 01/21/20             PT Long Term Goals - 01/07/20 1627      PT LONG TERM GOAL #1   Title Patient able to drive without having to remove left arm from wheel due to pain or weakness.    Time 8    Period Weeks    Status New    Target Date 03/03/20      PT LONG TERM GOAL #2   Title Patient able to sleep without waking from pain in the left shoulder.    Time 8    Period Weeks    Status New      PT LONG TERM GOAL #3   Title Patient to demo 5/5 left shoulder strength to normalize driving and ADLS.    Time 8    Period Weeks    Status New      PT LONG TERM GOAL #4   Title Improved FOTO limitations to <= 33%    Baseline baseline 37%                 Plan - 01/21/20 1530    Clinical Impression Statement Pt responded well to exercises today without increased pain.  Pt reports shoulder felt better after STM and there was tension in pecs, ant delt, UT, and posterior GH joint capsule.  Pt was able to add exercise progressions to HEP.  Continue to progress  towards current POC    PT Treatment/Interventions ADLs/Self Care Home Management;Cryotherapy;Electrical Stimulation;Iontophoresis 4mg /ml Dexamethasone;Moist Heat;Neuromuscular re-education;Therapeutic exercise;Therapeutic activities;Stair training;Patient/family education;Manual techniques;Dry needling;Taping;Spinal Manipulations;Joint Manipulations    PT Next Visit Plan Assess right knee if order added and add goals as indicated; Lt RTC and scapular stability, STM to ant delt, pec, UT Lt side    PT Home Exercise Plan [email or web address]    Consulted and Agree with Plan of Care Patient           Patient will benefit from skilled therapeutic intervention in order to improve the following deficits and impairments:  Decreased range of motion, Pain, Impaired flexibility, Postural dysfunction, Decreased strength, Impaired UE functional use, Increased muscle spasms  Visit Diagnosis: Chronic left shoulder pain  Muscle weakness (generalized)  Acute pain of right knee     Problem List Patient Active Problem List   Diagnosis Date Noted  . Medial epicondylitis of elbow, left 11/22/2019  . PAD (peripheral artery disease) ([place]) 04/01/2019  . De Quervain's syndrome (tenosynovitis) 03/12/2018  . Hearing loss, wears hearing aids 06/22/2017  . Radicular pain of left lower extremity 02/28/2017  . Chronic pain of both knees 06/21/2016  . Mild intermittent asthma 07/20/2015  . Other allergic rhinitis 07/20/2015  . Mixed hyperlipidemia 03/01/2012  . Migraine without aura 04/05/2010  . Vitamin D deficiency 03/19/2009  . GERD (gastroesophageal reflux disease) 07/08/2008  . DEGENERATIVE JOINT DISEASE 12/05/2007  . Hypothyroidism 01/22/2007  . IBS 09/28/2006  . Osteopenia 09/28/2006    Jule Ser, PT 01/21/2020, 4:18 PM  [facility] [address], [place], [place], [address] Phone: [phone]   Fax:  [phone]  Name: Pamela Rich MRN: [id number] Date of Birth: [birth year]

## 2020-01-21 NOTE — Patient Instructions (Signed)
Access Code: [email or web address] URL: [email or web address] Date: 01/21/2020 Prepared by: Jari Favre  Exercises Standing Shoulder Internal Rotation Stretch Behind Back - 2 x daily - 7 x weekly - 1 sets - 3 reps - 30 seconds hold Standing Scapular Retraction - 3 x daily - 7 x weekly - 1 sets - 10 reps - 3-5 sec hold Shoulder External Rotation with Resistance - 1 x daily - 7 x weekly - 3 sets - 10 reps Seated Shoulder Diagonal with Resistance - 1 x daily - 7 x weekly - 2 sets - 10 reps Standing Shoulder Flexion to 90 Degrees - 1 x daily - 7 x weekly - 3 sets - 10 reps  Patient Education Trigger Point Dry Needling

## 2020-01-22 ENCOUNTER — Ambulatory Visit: Payer: Medicare PPO | Admitting: Family Medicine

## 2020-01-22 ENCOUNTER — Ambulatory Visit: Payer: Self-pay

## 2020-01-22 ENCOUNTER — Encounter: Payer: Self-pay | Admitting: Family Medicine

## 2020-01-22 ENCOUNTER — Other Ambulatory Visit: Payer: Self-pay

## 2020-01-22 ENCOUNTER — Ambulatory Visit (INDEPENDENT_AMBULATORY_CARE_PROVIDER_SITE_OTHER): Payer: Medicare PPO

## 2020-01-22 VITALS — BP 122/78 | HR 72 | Ht 63.0 in | Wt 149.2 lb

## 2020-01-22 DIAGNOSIS — M25561 Pain in right knee: Secondary | ICD-10-CM

## 2020-01-22 DIAGNOSIS — M1711 Unilateral primary osteoarthritis, right knee: Secondary | ICD-10-CM | POA: Diagnosis not present

## 2020-01-22 NOTE — Progress Notes (Signed)
   I, Pamela Rich, LAT, ATC, am serving as scribe for Dr. Lynne Leader.  Pamela Rich is a 74 y.o. female who presents to [facility] today for f/u of L shoulder/upper arm pain and for new R knee pain.  She was last seen by Dr. Georgina Snell on 11/29/19 and was referred to outpatient PT of which she has completed 2 visits.  Since her last visit, pt reports new R knee pain that began hurting again.  She has chronic R knee pain and has seen ortho previously and has had prior knee injections.  She notes the steroid injections have not helped all that much.  She is interested in potential gel shots.  R knee swelling: yes, intermittently R knee mechanical symptoms: no Aggravating factors: kneeling; ascending stairs Treatments tried: Aspercreme; Tylenol; Advil   Pertinent review of systems: No fevers or chills  Relevant historical information: Hypothyroidism   Exam:  BP 122/78 (BP Location: Left Arm, Patient Position: Sitting, Cuff Size: Normal)   Pulse 72   Ht 5\' 3"  (1.6 m)   Wt 149 lb 3.2 oz (67.7 kg)   LMP 07/04/2000   SpO2 96%   BMI 26.43 kg/m  General: Well Developed, well nourished, and in no acute distress.   MSK: Right knee normal-appearing No particular tender. Normal motion with crepitation. Stable ligamentous exam.    Lab and Radiology Results  X-ray images right knee obtained today personally and independently reviewed Moderate medial compartment DJD.  Mild patellofemoral compartment DJD.  No acute fractures. Await formal radiology review    Assessment and Plan: 74 y.o. female with right knee pain failing conservative management.  We will work on authorization for hyaluronic acid injections.  Additionally patient is already doing physical therapy for her shoulder.  We will add on physical therapy for her knee as well.  Recheck near future.   PDMP not reviewed this encounter. Orders Placed This Encounter  Procedures  . DG Knee AP/LAT  W/Sun[place] Right    Standing Status:   Future    Number of Occurrences:   1    Standing Expiration Date:   01/21/2021    Order Specific Question:   Reason for Exam (SYMPTOM  OR DIAGNOSIS REQUIRED)    Answer:   eval knee pain    Order Specific Question:   Preferred imaging location?    Answer:   [facility]    Order Specific Question:   Radiology Contrast Protocol - do NOT remove file path    Answer:   [email or web address]  . Ambulatory referral to Physical Therapy    Referral Priority:   Routine    Referral Type:   Physical Medicine    Referral Reason:   Specialty Services Required    Requested Specialty:   Physical Therapy   No orders of the defined types were placed in this encounter.    Discussed warning signs or symptoms. Please see discharge instructions. Patient expresses understanding.   The above documentation has been reviewed and is accurate and complete Lynne Leader, M.D.

## 2020-01-22 NOTE — Patient Instructions (Signed)
Thank you for coming in today. Plan for xray today.  We will work on authorizing the gel shots.  Recheck as needed for injection once shots are authorized.  Add on knee to PT.    Sodium Hyaluronate intra-articular injection What is this medicine? SODIUM HYALURONATE (SOE dee um hye al yoor ON ate) is used to treat pain in the knee due to osteoarthritis. This medicine may be used for other purposes; ask your health care provider or pharmacist if you have questions. COMMON BRAND NAME(S): Amvisc, DUROLANE, Euflexxa, GELSYN-3, Hyalgan, Hymovis, Monovisc, Orthovisc, Supartz, Supartz FX, TriVisc, VISCO What should I tell my health care provider before I take this medicine? They need to know if you have any of these conditions:  bleeding disorders  glaucoma  infection in the knee joint  skin conditions or sensitivity  skin infection  an unusual allergic reaction to sodium hyaluronate, other medicines, foods, dyes, or preservatives. Different brands of sodium hyaluronate contain different allergens. Some may contain egg. Talk to your doctor about your allergies to make sure that you get the right product.  pregnant or trying to get pregnant  breast-feeding How should I use this medicine? This medicine is for injection into the knee joint. It is given by a health care professional in a hospital or clinic setting. Talk to your pediatrician regarding the use of this medicine in children. Special care may be needed. Overdosage: If you think you have taken too much of this medicine contact a poison control center or emergency room at once. NOTE: This medicine is only for you. Do not share this medicine with others. What if I miss a dose? This does not apply. What may interact with this medicine? Interactions are not expected. This list may not describe all possible interactions. Give your health care provider a list of all the medicines, herbs, non-prescription drugs, or dietary supplements  you use. Also tell them if you smoke, drink alcohol, or use illegal drugs. Some items may interact with your medicine. What should I watch for while using this medicine? Tell your doctor or healthcare professional if your symptoms do not start to get better or if they get worse. If receiving this medicine for osteoarthritis, limit your activity after you receive your injection. Avoid physical activity for 48 hours following your injection to keep your knee from swelling. Do not stand on your feet for more than 1 hour at a time during the first 48 hours following your injection. Ask your doctor or healthcare professional about when you can begin major physical activity again. What side effects may I notice from receiving this medicine? Side effects that you should report to your doctor or health care professional as soon as possible:  allergic reactions like skin rash, itching or hives, swelling of the face, lips, or tongue  dizziness  facial flushing  pain, tingling, numbness in the hands or feet  vision changes if received this medicine during eye surgery Side effects that usually do not require medical attention (report to your doctor or health care professional if they continue or are bothersome):  back pain  bruising at site where injected  chills  diarrhea  fever  headache  joint pain  joint stiffness  joint swelling  muscle cramps  muscle pain  nausea, vomiting  pain, redness, or irritation at site where injected  weak or tired This list may not describe all possible side effects. Call your doctor for medical advice about side effects. You may report side  effects to FDA at 1-800-FDA-1088. Where should I keep my medicine? This drug is given in a hospital or clinic and will not be stored at home. NOTE: This sheet is a summary. It may not cover all possible information. If you have questions about this medicine, talk to your doctor, pharmacist, or health care  provider.  2020 Elsevier/Gold Standard (2015-07-23 08:34:51)

## 2020-01-23 NOTE — Progress Notes (Signed)
X-rays knee shows medium arthritis

## 2020-02-20 ENCOUNTER — Ambulatory Visit: Payer: Medicare PPO | Admitting: Physical Therapy

## 2020-02-24 ENCOUNTER — Other Ambulatory Visit: Payer: Self-pay | Admitting: Internal Medicine

## 2020-02-25 ENCOUNTER — Other Ambulatory Visit: Payer: Self-pay

## 2020-02-25 ENCOUNTER — Ambulatory Visit: Payer: Medicare PPO | Attending: Family Medicine | Admitting: Physical Therapy

## 2020-02-25 DIAGNOSIS — M25512 Pain in left shoulder: Secondary | ICD-10-CM | POA: Diagnosis not present

## 2020-02-25 DIAGNOSIS — M25561 Pain in right knee: Secondary | ICD-10-CM

## 2020-02-25 DIAGNOSIS — M6281 Muscle weakness (generalized): Secondary | ICD-10-CM | POA: Insufficient documentation

## 2020-02-25 DIAGNOSIS — G8929 Other chronic pain: Secondary | ICD-10-CM | POA: Insufficient documentation

## 2020-02-25 NOTE — Patient Instructions (Signed)
Access Code: [email or web address] URL: [email or web address] Date: 02/25/2020 Prepared by: Jari Favre  Exercises Shoulder External Rotation with Resistance - 1 x daily - 7 x weekly - 2 sets - 10 reps Seated Shoulder Diagonal with Resistance - 1 x daily - 7 x weekly - 2 sets - 10 reps Standing Shoulder Flexion to 90 Degrees - 1 x daily - 7 x weekly - 2 sets - 10 reps Doorway Pec Stretch at 120 Degrees Abduction - 1 x daily - 7 x weekly - 1 sets - 3 reps - 30 sec hold Standing Row with Anchored Resistance - 1 x daily - 7 x weekly - 2 sets - 10 reps Shoulder extension with resistance - Neutral - 1 x daily - 7 x weekly - 2 sets - 10 reps Shoulder Flexion Serratus Activation with Resistance - 1 x daily - 7 x weekly - 2 sets - 10 reps  Patient Education Trigger Point Dry Needling

## 2020-02-25 NOTE — Therapy (Addendum)
[facility] [address] [place], [place], [address] Phone: [phone]   Fax:  [phone]  Physical Therapy Treatment  Patient Details  Name: SORAYA PAQUETTE MRN: [id number] Date of Birth: 10-16-1945 Referring Provider (PT): Lynne Leader, MD   Encounter Date: 02/25/2020   PT End of Session - 02/25/20 1020    Visit Number 3    Date for PT Re-Evaluation 03/03/20    Authorization Type [employer]    PT Start Time 1019    PT Stop Time 1058    PT Time Calculation (min) 39 min    Activity Tolerance Patient tolerated treatment well    Behavior During Therapy [facility] for tasks assessed/performed           Past Medical History:  Diagnosis Date  . Allergy    SEASONAL  . Fibroid    no surgery  . Heart murmur   . Hyperlipidemia   . Hypothyroidism   . Migraines   . Mild intermittent asthma 07/20/2015   pt is currently not taking any medications  . Osteopenia   . Sciatic nerve lesion 2018  . Vitamin D deficiency     Past Surgical History:  Procedure Laterality Date  . cortisone injections     in left hand  . DENTAL SURGERY    . DORSAL COMPARTMENT RELEASE Right 03/20/2018   Procedure: RIGHT OPEN  RELEASE DORSAL COMPARTMENT (DEQUERVAINS);  Surgeon: Daryll Brod, MD;  Location: [place];  Service: Orthopedics;  Laterality: Right;  . LUMBAR FUSION     Dr Hal Neer  . POLYPECTOMY     uterine  . SEPTOPLASTY    . SINOSCOPY    . TUBAL LIGATION    . WRIST ARTHROSCOPY WITH FOVEAL TRIANGULAR FIBROCARTILAGE COMPLEX REPAIR Right 03/20/2018   Procedure: RIGHT WRIST ARTHROSCOPY WITH DEBRIDEMENT, SCAPHOLUNATE/TRIANGULAR FIBROCARTILAGE COMPLEX REPAIR;  Surgeon: Daryll Brod, MD;  Location: [place];  Service: Orthopedics;  Laterality: Right;    There were no vitals filed for this visit.   Subjective Assessment - 02/25/20 1053    Subjective Reports 50-75% improved with sleeping and not sure  about driving    Currently in Pain? No/denies                             [facility] Adult PT Treatment/Exercise - 02/25/20 0001      Shoulder Exercises: Standing   Horizontal ABduction Strengthening;Both;Theraband    Theraband Level (Shoulder Horizontal ABduction) Level 3 (Green)    Flexion Strengthening;Both;20 reps;Weights    Shoulder Flexion Weight (lbs) 2    Flexion Limitations flexion with serratus light green loop - 20x    Extension Strengthening;Both;20 reps;Theraband    Theraband Level (Shoulder Extension) Level 4 (Blue)    Other Standing Exercises elbow ext - 20x blue      Shoulder Exercises: ROM/Strengthening   UBE (Upper Arm Bike) L1 5/4 min fwd/back for endurance exercise - no pain PT monitored    Wall Pushups 20 reps    Other ROM/Strengthening Exercises pec stretch in doorway                  PT Education - 02/25/20 1052    Education Details Access Code: [email or web address]    Person(s) Educated Patient    Methods Explanation;Demonstration;Tactile cues;Verbal cues;Handout    Comprehension Verbalized understanding;Returned demonstration            PT Short Term Goals -  02/25/20 1038      PT SHORT TERM GOAL #1   Title Ind with initial HEP    Status Achieved             PT Long Term Goals - 02/25/20 1035      PT LONG TERM GOAL #1   Title Patient able to drive without having to remove left arm from wheel due to pain or weakness.    Status On-going      PT LONG TERM GOAL #2   Title Patient able to sleep without waking from pain in the left shoulder.    Baseline I can't put my hand up over my head but it is better at least 50-75%    Status On-going      PT LONG TERM GOAL #3   Title Patient to demo 5/5 left shoulder strength to normalize driving and ADLS.    Status On-going      PT LONG TERM GOAL #4   Title Improved FOTO limitations to <= 33%    Status On-going                 Plan - 02/25/20 1038    Clinical Impression  Statement Pt met initial goal of HEP consistency.  Pt able to progress HEP today.  She is 50-75% better when sleeping, but she hasn't made a long road trip yet so not sure about driving.  Pt states her knee is better so we did not add that to POC.  Pt is recommended to continue with shoulder strengthening so she can return to driving safely to visit her daughter in [place]. Pt is recommended to continue skilled PT as today was second follow up visit due to inconsistency in schedule.  Pt is traveling and will not return for several weeks so requesting to extend her POC.Marland Kitchen   PT Treatment/Interventions ADLs/Self Care Home Management;Cryotherapy;Electrical Stimulation;Iontophoresis 102m/ml Dexamethasone;Moist Heat;Neuromuscular re-education;Therapeutic exercise;Therapeutic activities;Stair training;Patient/family education;Manual techniques;Dry needling;Taping;Spinal Manipulations;Joint Manipulations    PT Next Visit Plan Progress shoulder and scap strength, f/u on pain with driving    [place]          Patient will benefit from skilled therapeutic intervention in order to improve the following deficits and impairments:  Decreased range of motion, Pain, Impaired flexibility, Postural dysfunction, Decreased strength, Impaired UE functional use, Increased muscle spasms  Visit Diagnosis: Chronic left shoulder pain  Muscle weakness (generalized)  Acute pain of right knee     Problem List Patient Active Problem List   Diagnosis Date Noted  . Medial epicondylitis of elbow, left 11/22/2019  . PAD (peripheral artery disease) ([place] 04/01/2019  . De Quervain's syndrome (tenosynovitis) 03/12/2018  . Hearing loss, wears hearing aids 06/22/2017  . Radicular pain of left lower extremity 02/28/2017  . Chronic pain of both knees 06/21/2016  . Mild intermittent asthma 07/20/2015  . Other allergic rhinitis 07/20/2015  . Mixed hyperlipidemia 03/01/2012  . Migraine without aura  04/05/2010  . Vitamin D deficiency 03/19/2009  . GERD (gastroesophageal reflux disease) 07/08/2008  . DEGENERATIVE JOINT DISEASE 12/05/2007  . Hypothyroidism 01/22/2007  . IBS 09/28/2006  . Osteopenia 09/28/2006    JJule Ser PT 02/25/2020, 11:02 AM  [facility] [address] [place] [address]: [phone]  Fax:  [phone] Name: BLAQUAN LUDDENMRN: [id number] of Birth: 608-18-1947 PHYSICAL THERAPY DISCHARGE SUMMARY  Visits from Start of Care: 3  Current functional level related to goals / functional outcomes: See above details   Remaining deficits: See above   Education / Equipment: HEP  Plan: Patient agrees to discharge.  Patient goals were partially met. Patient is being discharged due to being pleased with the current functional level.  ?????    Called and was feeling better  Gustavus Bryant, PT 04/22/20 12:34 PM

## 2020-02-27 DIAGNOSIS — L814 Other melanin hyperpigmentation: Secondary | ICD-10-CM | POA: Diagnosis not present

## 2020-02-27 DIAGNOSIS — L738 Other specified follicular disorders: Secondary | ICD-10-CM | POA: Diagnosis not present

## 2020-02-27 DIAGNOSIS — L821 Other seborrheic keratosis: Secondary | ICD-10-CM | POA: Diagnosis not present

## 2020-02-27 DIAGNOSIS — D1801 Hemangioma of skin and subcutaneous tissue: Secondary | ICD-10-CM | POA: Diagnosis not present

## 2020-02-27 DIAGNOSIS — D225 Melanocytic nevi of trunk: Secondary | ICD-10-CM | POA: Diagnosis not present

## 2020-03-01 NOTE — Addendum Note (Signed)
Addended by: Su Hoff on: 03/01/2020 05:46 PM   Modules accepted: Orders

## 2020-03-04 ENCOUNTER — Ambulatory Visit: Payer: Medicare PPO | Admitting: Allergy and Immunology

## 2020-03-11 ENCOUNTER — Other Ambulatory Visit: Payer: Self-pay | Admitting: Internal Medicine

## 2020-03-12 ENCOUNTER — Encounter: Payer: Self-pay | Admitting: Allergy and Immunology

## 2020-03-12 ENCOUNTER — Other Ambulatory Visit: Payer: Self-pay

## 2020-03-12 ENCOUNTER — Ambulatory Visit: Payer: Medicare PPO | Admitting: Allergy and Immunology

## 2020-03-12 VITALS — BP 132/86 | HR 70 | Temp 98.1°F | Resp 16

## 2020-03-12 DIAGNOSIS — J4520 Mild intermittent asthma, uncomplicated: Secondary | ICD-10-CM | POA: Diagnosis not present

## 2020-03-12 DIAGNOSIS — J3089 Other allergic rhinitis: Secondary | ICD-10-CM | POA: Diagnosis not present

## 2020-03-12 MED ORDER — AZELASTINE HCL 0.1 % NA SOLN
NASAL | 1 refills | Status: DC
Start: 2020-03-12 — End: 2021-09-22

## 2020-03-12 MED ORDER — FLUTICASONE PROPIONATE 50 MCG/ACT NA SUSP
NASAL | 1 refills | Status: DC
Start: 2020-03-12 — End: 2021-09-22

## 2020-03-12 NOTE — Assessment & Plan Note (Signed)
Well-controlled.  Continue albuterol HFA, 1-2 inhalations every 4-6 hours if needed.  During respiratory tract infections or asthma flares, add Flovent 44g 2 inhalations via spacer device 2 times per day until symptoms have returned to baseline.  If subjective and objective measures of pulmonary function remain stable, we will consider stepping down therapy on the next visit.

## 2020-03-12 NOTE — Assessment & Plan Note (Addendum)
Stable.  Continue appropriate allergen avoidance measures.  Continue azelastine nasal spray if needed.  Continue fluticasone nasal spray if needed.  Nasal saline spray (i.e., Simply Saline) or nasal saline lavage (i.e., NeilMed) is recommended as needed and prior to medicated nasal sprays.

## 2020-03-12 NOTE — Patient Instructions (Addendum)
Mild intermittent asthma Well-controlled.  Continue albuterol HFA, 1-2 inhalations every 4-6 hours if needed.  During respiratory tract infections or asthma flares, add Flovent 44g 2 inhalations via spacer device 2 times per day until symptoms have returned to baseline.  If subjective and objective measures of pulmonary function remain stable, we will consider stepping down therapy on the next visit.  Other allergic rhinitis Stable.  Continue appropriate allergen avoidance measures.  Continue azelastine nasal spray if needed.  Continue fluticasone nasal spray if needed.  Nasal saline spray (i.e., Simply Saline) or nasal saline lavage (i.e., NeilMed) is recommended as needed and prior to medicated nasal sprays.   Return in about 6 months (around 09/09/2020).

## 2020-03-12 NOTE — Progress Notes (Signed)
Follow-up Note  RE: Pamela Rich MRN: [id number] DOB: Dec 13, 1945 Date of Office Visit: 03/12/2020  Primary care provider: Binnie Rail, MD Referring provider: Binnie Rail, MD  History of present illness: Pamela Rich is a 74 y.o. female with asthma and allergic rhinitis presenting today for follow-up.  She was last seen in this clinic on September 02, 2019. She reports that in the interval since her previous visit her asthma has been well controlled.  She rarely requires albuterol rescue and does not experience limitations in normal daily activities or nocturnal awakenings due to lower respiratory symptoms.  She did experience an allergy flare in April after sitting outdoors for prolonged period of time during the [employer].  However, otherwise her nasal allergy symptoms are well controlled with azelastine nasal spray and/or fluticasone nasal spray.  She requests refill prescriptions for the nasal sprays.  Assessment and plan: Mild intermittent asthma Well-controlled.  Continue albuterol HFA, 1-2 inhalations every 4-6 hours if needed.  During respiratory tract infections or asthma flares, add Flovent 44g 2 inhalations via spacer device 2 times per day until symptoms have returned to baseline.  If subjective and objective measures of pulmonary function remain stable, we will consider stepping down therapy on the next visit.  Other allergic rhinitis Stable.  Continue appropriate allergen avoidance measures.  Continue azelastine nasal spray if needed.  Continue fluticasone nasal spray if needed.  Nasal saline spray (i.e., Simply Saline) or nasal saline lavage (i.e., NeilMed) is recommended as needed and prior to medicated nasal sprays.   Meds ordered this encounter  Medications  . fluticasone (FLONASE) 50 MCG/ACT nasal spray    Sig: SPRAY 2 SPRAYS INTO EACH NOSTRIL EVERY DAY    Dispense:  48 mL    Refill:  1    Dispense 90 day supply  . azelastine  (ASTELIN) 0.1 % nasal spray    Sig: Use in each nostril as directed    Dispense:  90 mL    Refill:  1    Dispense 90 day supply    Diagnostics: Spirometry:  Normal with an FEV1 of 88% predicted. This study was performed while the patient was asymptomatic.  Please see scanned spirometry results for details.    Physical examination: Blood pressure 132/86, pulse 70, temperature 98.1 F (36.7 C), temperature source Oral, resp. rate 16, last menstrual period 07/04/2000, SpO2 96 %.  General: Alert, interactive, in no acute distress. HEENT: TMs pearly gray, turbinates minimally edematous without discharge, post-pharynx unremarkable. Neck: Supple without lymphadenopathy. Lungs: Clear to auscultation without wheezing, rhonchi or rales. CV: Normal S1, S2 without murmurs. Skin: Warm and dry, without lesions or rashes.  The following portions of the patient's history were reviewed and updated as appropriate: allergies, current medications, past family history, past medical history, past social history, past surgical history and problem list.  Current Outpatient Medications  Medication Sig Dispense Refill  . albuterol (PROAIR HFA) 108 (90 Base) MCG/ACT inhaler INHALE 2 PUFFS EVERY 4 HOURS AS NEEDED FOR WHEEZING OR COUGHING SPELLS 18 g 2  . Ascorbic Acid (VITAMIN C) 1000 MG tablet Take 1,000 mg by mouth daily.      Marland Kitchen aspirin 81 MG tablet Take 81 mg by mouth daily.    Marland Kitchen azelastine (ASTELIN) 0.1 % nasal spray Use in each nostril as directed 90 mL 1  . Cholecalciferol (VITAMIN D3) 1000 UNITS CAPS Take by mouth daily.      . fluticasone (FLONASE) 50 MCG/ACT nasal spray SPRAY  2 SPRAYS INTO EACH NOSTRIL EVERY DAY 48 mL 1  . fluticasone (FLOVENT HFA) 44 MCG/ACT inhaler Inhale 2 puffs into the lungs 2 (two) times daily. 52.7 g 5  . folic acid (FOLVITE) 1 MG tablet Take 1 mg by mouth daily.    Marland Kitchen levothyroxine (SYNTHROID) 50 MCG tablet TAKE 2 TABLETS (100 MCG TOTAL) BY MOUTH DAILY. 180 tablet 1  .  Multiple Vitamin (MULTIVITAMIN) tablet Take by mouth.    . pravastatin (PRAVACHOL) 20 MG tablet TAKE 1 TABLET BY MOUTH EVERY DAY 90 tablet 1  . fluticasone (FLOVENT HFA) 110 MCG/ACT inhaler Inhale 2 puffs into the lungs 2 (two) times daily. Use with spacer. (Patient not taking: Reported on 03/12/2020) 1 Inhaler 5   No current facility-administered medications for this visit.    No Known Allergies  I appreciate the opportunity to take part in [place] care. Please do not hesitate to contact me with questions.  Sincerely,   R. Edgar Frisk, MD

## 2020-03-25 ENCOUNTER — Ambulatory Visit: Payer: Medicare PPO | Admitting: Physical Therapy

## 2020-04-08 ENCOUNTER — Encounter: Payer: Medicare PPO | Admitting: Physical Therapy

## 2020-04-15 ENCOUNTER — Encounter: Payer: Medicare PPO | Admitting: Physical Therapy

## 2020-04-16 ENCOUNTER — Other Ambulatory Visit: Payer: Self-pay

## 2020-04-16 ENCOUNTER — Ambulatory Visit: Payer: Medicare PPO

## 2020-04-16 ENCOUNTER — Ambulatory Visit
Admission: RE | Admit: 2020-04-16 | Discharge: 2020-04-16 | Disposition: A | Discharge status: Home / Self Care | Payer: Medicare PPO | Source: Ambulatory Visit | Attending: Internal Medicine | Admitting: Internal Medicine

## 2020-04-16 DIAGNOSIS — M85852 Other specified disorders of bone density and structure, left thigh: Secondary | ICD-10-CM | POA: Diagnosis not present

## 2020-04-16 DIAGNOSIS — Z780 Asymptomatic menopausal state: Secondary | ICD-10-CM | POA: Diagnosis not present

## 2020-04-19 ENCOUNTER — Encounter: Payer: Self-pay | Admitting: Internal Medicine

## 2020-04-22 ENCOUNTER — Encounter: Payer: Medicare PPO | Admitting: Physical Therapy

## 2020-05-08 ENCOUNTER — Other Ambulatory Visit: Payer: Self-pay

## 2020-05-08 ENCOUNTER — Ambulatory Visit
Admission: RE | Admit: 2020-05-08 | Discharge: 2020-05-08 | Disposition: A | Discharge status: Home / Self Care | Payer: Medicare PPO | Source: Ambulatory Visit | Attending: Obstetrics & Gynecology | Admitting: Obstetrics & Gynecology

## 2020-05-08 DIAGNOSIS — Z1231 Encounter for screening mammogram for malignant neoplasm of breast: Secondary | ICD-10-CM

## 2020-05-18 DIAGNOSIS — H04121 Dry eye syndrome of right lacrimal gland: Secondary | ICD-10-CM | POA: Diagnosis not present

## 2020-07-20 ENCOUNTER — Encounter: Payer: Medicare PPO | Admitting: Internal Medicine

## 2020-08-03 ENCOUNTER — Other Ambulatory Visit: Payer: Self-pay

## 2020-08-03 NOTE — Progress Notes (Signed)
Subjective:    Patient ID: Pamela Rich, female    DOB: Dec 02, 1945, 75 y.o.   MRN: [id number]   This visit occurred during the SARS-CoV-2 public health emergency.  Safety protocols were in place, including screening questions prior to the visit, additional usage of staff PPE, and extensive cleaning of exam room while observing appropriate contact time as indicated for disinfecting solutions.    HPI She is here for a physical exam.   She has urinary frequency and started having some leakage.     Medications and allergies reviewed with patient and updated if appropriate.  Patient Active Problem List   Diagnosis Date Noted  . Medial epicondylitis of elbow, left 11/22/2019  . PAD (peripheral artery disease) ([place]) 04/01/2019  . De Quervain's syndrome (tenosynovitis) 03/12/2018  . Hearing loss, wears hearing aids 06/22/2017  . Radicular pain of left lower extremity 02/28/2017  . Chronic pain of both knees 06/21/2016  . Mild intermittent asthma 07/20/2015  . Other allergic rhinitis 07/20/2015  . Mixed hyperlipidemia 03/01/2012  . Migraine without aura 04/05/2010  . Vitamin D deficiency 03/19/2009  . GERD (gastroesophageal reflux disease) 07/08/2008  . DEGENERATIVE JOINT DISEASE 12/05/2007  . Hypothyroidism 01/22/2007  . IBS 09/28/2006  . Osteopenia 09/28/2006    Current Outpatient Medications on File Prior to Visit  Medication Sig Dispense Refill  . albuterol (PROAIR HFA) 108 (90 Base) MCG/ACT inhaler INHALE 2 PUFFS EVERY 4 HOURS AS NEEDED FOR WHEEZING OR COUGHING SPELLS 18 g 2  . Ascorbic Acid (VITAMIN C) 1000 MG tablet Take 1,000 mg by mouth daily.    Marland Kitchen aspirin 81 MG tablet Take 81 mg by mouth daily.    Marland Kitchen azelastine (ASTELIN) 0.1 % nasal spray Use in each nostril as directed 90 mL 1  . Cholecalciferol (VITAMIN D3) 1000 UNITS CAPS Take by mouth daily.    . fluticasone (FLONASE) 50 MCG/ACT nasal spray SPRAY 2 SPRAYS INTO EACH NOSTRIL EVERY DAY 48 mL 1  . fluticasone  (FLOVENT HFA) 44 MCG/ACT inhaler Inhale 2 puffs into the lungs 2 (two) times daily. [id number] g 5  . folic acid (FOLVITE) 1 MG tablet Take 1 mg by mouth daily.    Marland Kitchen levothyroxine (SYNTHROID) 50 MCG tablet TAKE 2 TABLETS (100 MCG TOTAL) BY MOUTH DAILY. 180 tablet 1  . Multiple Vitamin (MULTIVITAMIN) tablet Take by mouth.    . pravastatin (PRAVACHOL) 20 MG tablet TAKE 1 TABLET BY MOUTH EVERY DAY 90 tablet 1   No current facility-administered medications on file prior to visit.    Past Medical History:  Diagnosis Date  . Allergy    SEASONAL  . Fibroid    no surgery  . Heart murmur   . Hyperlipidemia   . Hypothyroidism   . Migraines   . Mild intermittent asthma 07/20/2015   pt is currently not taking any medications  . Osteopenia   . Sciatic nerve lesion 2018  . Vitamin D deficiency     Past Surgical History:  Procedure Laterality Date  . cortisone injections     in left hand  . DENTAL SURGERY    . DORSAL COMPARTMENT RELEASE Right 03/20/2018   Procedure: RIGHT OPEN  RELEASE DORSAL COMPARTMENT (DEQUERVAINS);  Surgeon: Daryll Brod, MD;  Location: [place];  Service: Orthopedics;  Laterality: Right;  . LUMBAR FUSION     Dr Hal Neer  . POLYPECTOMY     uterine  . SEPTOPLASTY    . SINOSCOPY    . TUBAL LIGATION    .  WRIST ARTHROSCOPY WITH FOVEAL TRIANGULAR FIBROCARTILAGE COMPLEX REPAIR Right 03/20/2018   Procedure: RIGHT WRIST ARTHROSCOPY WITH DEBRIDEMENT, SCAPHOLUNATE/TRIANGULAR FIBROCARTILAGE COMPLEX REPAIR;  Surgeon: Daryll Brod, MD;  Location: [place];  Service: Orthopedics;  Laterality: Right;    Social History   Socioeconomic History  . Marital status: Widowed    Spouse name: Not on file  . Number of children: 3  . Years of education: Not on file  . Highest education level: Not on file  Occupational History  . Occupation: retired  Tobacco Use  . Smoking status: Former Smoker    Packs/day: 0.30    Years: 40.00    Pack years: 12.00     Types: Cigarettes    Quit date: 07/04/1974    Years since quitting: 46.1  . Smokeless tobacco: Never Used  . Tobacco comment: smoked 1965-1976 , < 3 cig/ day  Vaping Use  . Vaping Use: Never used  Substance and Sexual Activity  . Alcohol use: No    Alcohol/week: 0.0 standard drinks    Comment: rarely  . Drug use: No  . Sexual activity: Not Currently    Partners: Male    Birth control/protection: Surgical, Post-menopausal    Comment: BTL  Other Topics Concern  . Not on file  Social History Narrative   Exercise: walking, stretching, weights   Social Determinants of Health   Financial Resource Strain: Not on file  Food Insecurity: Not on file  Transportation Needs: Not on file  Physical Activity: Not on file  Stress: Not on file  Social Connections: Not on file    Family History  Problem Relation Age of Onset  . Hypertension Mother   . Heart disease Mother        CHF; AS  . Osteoporosis Mother   . Heart attack Mother 31  . Allergic rhinitis Mother   . Diabetes Father   . Stroke Father        onset in 86s  . Cancer Brother        malignant brain tumor  . Colon cancer Neg Hx   . Esophageal cancer Neg Hx   . Rectal cancer Neg Hx   . Stomach cancer Neg Hx   . Angioedema Neg Hx   . Asthma Neg Hx   . Eczema Neg Hx   . Immunodeficiency Neg Hx   . Urticaria Neg Hx   . Breast cancer Neg Hx     Review of Systems  Constitutional: Negative for chills and fever.  HENT: Positive for ear pain (intermittent R ear pain).   Eyes: Negative for visual disturbance.  Respiratory: Positive for wheezing (sometimes). Negative for cough and shortness of breath.   Cardiovascular: Positive for palpitations (occ). Negative for chest pain.  Gastrointestinal: Negative for abdominal pain, blood in stool, constipation, diarrhea and nausea.       No gerd  Genitourinary: Positive for frequency. Negative for dysuria.       Mild urinary leakage  Musculoskeletal: Negative for arthralgias and  back pain.  Skin: Positive for rash (upper back - bumps on back - itch).  Neurological: Positive for dizziness (occ) and headaches (sinus).  Psychiatric/Behavioral: Negative for dysphoric mood. The patient is nervous/anxious.        Objective:   Vitals:   08/04/20 1254  BP: 124/72  Pulse: 78  Temp: 98.2 F (36.8 C)  SpO2: 98%   Filed Weights   08/04/20 1254  Weight: 151 lb (68.5 kg)   Body mass index is  26.75 kg/m.  BP Readings from Last 3 Encounters:  08/04/20 124/72  03/12/20 132/86  01/22/20 122/78    Wt Readings from Last 3 Encounters:  08/04/20 151 lb (68.5 kg)  01/22/20 149 lb 3.2 oz (67.7 kg)  11/29/19 149 lb 6.4 oz (67.8 kg)     Physical Exam Constitutional: She appears well-developed and well-nourished. No distress.  HENT:  Head: Normocephalic and atraumatic.  Right Ear: External ear normal. Normal ear canal and TM Left Ear: External ear normal.  Normal ear canal and TM Mouth/Throat: Oropharynx is clear and moist.  Eyes: Conjunctivae and EOM are normal.  Neck: Neck supple. No tracheal deviation present. No thyromegaly present.  No carotid bruit  Cardiovascular: Normal rate, regular rhythm and normal heart sounds.   No murmur heard.  No edema. Pulmonary/Chest: Effort normal and breath sounds normal. No respiratory distress. She has no wheezes. She has no rales.  Breast: deferred   Abdominal: Soft. She exhibits no distension. There is no tenderness.  Lymphadenopathy: She has no cervical adenopathy.  Skin: Skin is warm and dry. She is not diaphoretic.  Psychiatric: She has a normal mood and affect. Her behavior is normal.        Assessment & Plan:   Physical exam: Screening blood work    ordered Immunizations   discussed shingrix Colonoscopy  Up to date  Mammogram  Up to date  Gyn  Scheduled for March Dexa  Up to date  Eye exams  Up to date  Exercise  Walking  Weight  Good for age Substance abuse   none      See Problem List for  Assessment and Plan of chronic medical problems.

## 2020-08-03 NOTE — Patient Instructions (Addendum)
Blood work was ordered.     No immunization administered today.   Medications changes include :  lexapro 5 mg daily  Your prescription(s) have been submitted to your pharmacy.      Please followup in 1 year    Health Maintenance, Female Adopting a healthy lifestyle and getting preventive care are important in promoting health and wellness. Ask your health care provider about:  The right schedule for you to have regular tests and exams.  Things you can do on your own to prevent diseases and keep yourself healthy. What should I know about diet, weight, and exercise? Eat a healthy diet  Eat a diet that includes plenty of vegetables, fruits, low-fat dairy products, and lean protein.  Do not eat a lot of foods that are high in solid fats, added sugars, or sodium.   Maintain a healthy weight Body mass index (BMI) is used to identify weight problems. It estimates body fat based on height and weight. Your health care provider can help determine your BMI and help you achieve or maintain a healthy weight. Get regular exercise Get regular exercise. This is one of the most important things you can do for your health. Most adults should:  Exercise for at least 150 minutes each week. The exercise should increase your heart rate and make you sweat (moderate-intensity exercise).  Do strengthening exercises at least twice a week. This is in addition to the moderate-intensity exercise.  Spend less time sitting. Even light physical activity can be beneficial. Watch cholesterol and blood lipids Have your blood tested for lipids and cholesterol at 75 years of age, then have this test every 5 years. Have your cholesterol levels checked more often if:  Your lipid or cholesterol levels are high.  You are older than 75 years of age.  You are at high risk for heart disease. What should I know about cancer screening? Depending on your health history and family history, you may need to have  cancer screening at various ages. This may include screening for:  Breast cancer.  Cervical cancer.  Colorectal cancer.  Skin cancer.  Lung cancer. What should I know about heart disease, diabetes, and high blood pressure? Blood pressure and heart disease  High blood pressure causes heart disease and increases the risk of stroke. This is more likely to develop in people who have high blood pressure readings, are of African descent, or are overweight.  Have your blood pressure checked: ? Every 3-5 years if you are 53-89 years of age. ? Every year if you are 47 years old or older. Diabetes Have regular diabetes screenings. This checks your fasting blood sugar level. Have the screening done:  Once every three years after age 61 if you are at a normal weight and have a low risk for diabetes.  More often and at a younger age if you are overweight or have a high risk for diabetes. What should I know about preventing infection? Hepatitis B If you have a higher risk for hepatitis B, you should be screened for this virus. Talk with your health care provider to find out if you are at risk for hepatitis B infection. Hepatitis C Testing is recommended for:  Everyone born from [birth year] through 1965.  Anyone with known risk factors for hepatitis C. Sexually transmitted infections (STIs)  Get screened for STIs, including gonorrhea and chlamydia, if: ? You are sexually active and are younger than 75 years of age. ? You are older than 75 years  of age and your health care provider tells you that you are at risk for this type of infection. ? Your sexual activity has changed since you were last screened, and you are at increased risk for chlamydia or gonorrhea. Ask your health care provider if you are at risk.  Ask your health care provider about whether you are at high risk for HIV. Your health care provider may recommend a prescription medicine to help prevent HIV infection. If you choose to take  medicine to prevent HIV, you should first get tested for HIV. You should then be tested every 3 months for as long as you are taking the medicine. Pregnancy  If you are about to stop having your period (premenopausal) and you may become pregnant, seek counseling before you get pregnant.  Take 400 to 800 micrograms (mcg) of folic acid every day if you become pregnant.  Ask for birth control (contraception) if you want to prevent pregnancy. Osteoporosis and menopause Osteoporosis is a disease in which the bones lose minerals and strength with aging. This can result in bone fractures. If you are 20 years old or older, or if you are at risk for osteoporosis and fractures, ask your health care provider if you should:  Be screened for bone loss.  Take a calcium or vitamin D supplement to lower your risk of fractures.  Be given hormone replacement therapy (HRT) to treat symptoms of menopause. Follow these instructions at home: Lifestyle  Do not use any products that contain nicotine or tobacco, such as cigarettes, e-cigarettes, and chewing tobacco. If you need help quitting, ask your health care provider.  Do not use street drugs.  Do not share needles.  Ask your health care provider for help if you need support or information about quitting drugs. Alcohol use  Do not drink alcohol if: ? Your health care provider tells you not to drink. ? You are pregnant, may be pregnant, or are planning to become pregnant.  If you drink alcohol: ? Limit how much you use to 0-1 drink a day. ? Limit intake if you are breastfeeding.  Be aware of how much alcohol is in your drink. In the [place] one drink equals one 12 oz bottle of beer (355 mL), one 5 oz glass of wine (148 mL), or one 1 oz glass of hard liquor (44 mL). General instructions  Schedule regular health, dental, and eye exams.  Stay current with your vaccines.  Tell your health care provider if: ? You often feel depressed. ? You have  ever been abused or do not feel safe at home. Summary  Adopting a healthy lifestyle and getting preventive care are important in promoting health and wellness.  Follow your health care provider's instructions about healthy diet, exercising, and getting tested or screened for diseases.  Follow your health care provider's instructions on monitoring your cholesterol and blood pressure. This information is not intended to replace advice given to you by your health care provider. Make sure you discuss any questions you have with your health care provider. Document Revised: 06/13/2018 Document Reviewed: 06/13/2018 Elsevier Patient Education  2021 [employer].

## 2020-08-04 ENCOUNTER — Encounter: Payer: Self-pay | Admitting: Internal Medicine

## 2020-08-04 ENCOUNTER — Ambulatory Visit (INDEPENDENT_AMBULATORY_CARE_PROVIDER_SITE_OTHER): Payer: Medicare PPO | Admitting: Internal Medicine

## 2020-08-04 VITALS — BP 124/72 | HR 78 | Temp 98.2°F | Ht 63.0 in | Wt 151.0 lb

## 2020-08-04 DIAGNOSIS — E039 Hypothyroidism, unspecified: Secondary | ICD-10-CM

## 2020-08-04 DIAGNOSIS — E559 Vitamin D deficiency, unspecified: Secondary | ICD-10-CM

## 2020-08-04 DIAGNOSIS — Z0000 Encounter for general adult medical examination without abnormal findings: Secondary | ICD-10-CM

## 2020-08-04 DIAGNOSIS — F419 Anxiety disorder, unspecified: Secondary | ICD-10-CM | POA: Insufficient documentation

## 2020-08-04 DIAGNOSIS — M85852 Other specified disorders of bone density and structure, left thigh: Secondary | ICD-10-CM | POA: Diagnosis not present

## 2020-08-04 DIAGNOSIS — E782 Mixed hyperlipidemia: Secondary | ICD-10-CM

## 2020-08-04 DIAGNOSIS — J4520 Mild intermittent asthma, uncomplicated: Secondary | ICD-10-CM | POA: Diagnosis not present

## 2020-08-04 DIAGNOSIS — I739 Peripheral vascular disease, unspecified: Secondary | ICD-10-CM

## 2020-08-04 LAB — CBC WITH DIFFERENTIAL/PLATELET
Basophils Absolute: 0.1 10*3/uL (ref 0.0–0.1)
Basophils Relative: 0.9 % (ref 0.0–3.0)
Eosinophils Absolute: 0.1 10*3/uL (ref 0.0–0.7)
Eosinophils Relative: 1.3 % (ref 0.0–5.0)
HCT: 41 % (ref 36.0–46.0)
Hemoglobin: 13.8 g/dL (ref 12.0–15.0)
Lymphocytes Relative: 25.1 % (ref 12.0–46.0)
Lymphs Abs: 2.2 10*3/uL (ref 0.7–4.0)
MCHC: 33.7 g/dL (ref 30.0–36.0)
MCV: 93.9 fl (ref 78.0–100.0)
Monocytes Absolute: 0.5 10*3/uL (ref 0.1–1.0)
Monocytes Relative: 5.7 % (ref 3.0–12.0)
Neutro Abs: 5.9 10*3/uL (ref 1.4–7.7)
Neutrophils Relative %: 67 % (ref 43.0–77.0)
Platelets: 345 10*3/uL (ref 150.0–400.0)
RBC: 4.37 Mil/uL (ref 3.87–5.11)
RDW: 13.6 % (ref 11.5–15.5)
WBC: 8.7 10*3/uL (ref 4.0–10.5)

## 2020-08-04 LAB — LIPID PANEL
Cholesterol: 185 mg/dL (ref 0–200)
HDL: 69.3 mg/dL (ref >39.00)
LDL Cholesterol: 92 mg/dL (ref 0–99)
NonHDL: 115.69
Total CHOL/HDL Ratio: 3
Triglycerides: 117 mg/dL (ref 0.0–149.0)
VLDL: 23.4 mg/dL (ref 0.0–40.0)

## 2020-08-04 LAB — COMPREHENSIVE METABOLIC PANEL
ALT: 11 U/L (ref 0–35)
AST: 21 U/L (ref 0–37)
Albumin: 4.3 g/dL (ref 3.5–5.2)
Alkaline Phosphatase: 72 U/L (ref 39–117)
BUN: 16 mg/dL (ref 6–23)
CO2: 32 mEq/L (ref 19–32)
Calcium: 9.6 mg/dL (ref 8.4–10.5)
Chloride: 101 mEq/L (ref 96–112)
Creatinine, Ser: 0.81 mg/dL (ref 0.40–1.20)
GFR: 71.39 mL/min (ref >60.00)
Glucose, Bld: 86 mg/dL (ref 70–99)
Potassium: 3.6 mEq/L (ref 3.5–5.1)
Sodium: 138 mEq/L (ref 135–145)
Total Bilirubin: 0.3 mg/dL (ref 0.2–1.2)
Total Protein: 7.3 g/dL (ref 6.0–8.3)

## 2020-08-04 LAB — VITAMIN D 25 HYDROXY (VIT D DEFICIENCY, FRACTURES): VITD: 60.76 ng/mL (ref 30.00–100.00)

## 2020-08-04 LAB — TSH: TSH: 1.56 u[IU]/mL (ref 0.35–4.50)

## 2020-08-04 MED ORDER — ESCITALOPRAM OXALATE 5 MG PO TABS: 5.0000 mg | ORAL_TABLET | Freq: Every day | ORAL | 5 refills | Status: DC

## 2020-08-04 NOTE — Assessment & Plan Note (Signed)
Chronic Controlled Seeing asthma specialist

## 2020-08-04 NOTE — Assessment & Plan Note (Addendum)
New problem Having increased anxiety and wonders if she should take something to help Start lexapro 5 mg daily

## 2020-08-04 NOTE — Assessment & Plan Note (Signed)
Chronic No leg pain with walking

## 2020-08-04 NOTE — Assessment & Plan Note (Signed)
Chronic Taking vitamin D daily Check vitamin D level  

## 2020-08-04 NOTE — Assessment & Plan Note (Signed)
Chronic  Clinically euthyroid Currently taking levothyroxine 50 mcg Check tsh  Titrate med dose if needed  

## 2020-08-04 NOTE — Assessment & Plan Note (Signed)
Chronic Check lipid panel  Continue pravastatin 20 mg daily Regular exercise and healthy diet encouraged  

## 2020-08-04 NOTE — Addendum Note (Signed)
Addended by: Boris Lown B on: 08/04/2020 01:46 PM   Modules accepted: Orders

## 2020-08-04 NOTE — Assessment & Plan Note (Signed)
Chronic dexa up to date Exercising regularly Taking vitamin d daily, MVI

## 2020-08-07 ENCOUNTER — Telehealth: Payer: Self-pay | Admitting: Internal Medicine

## 2020-08-07 NOTE — Telephone Encounter (Signed)
She may need to come in to discuss - there are possible meds she can take as needed but there are side effects that need to be discussed that can be serious.  It is not ideal to take meds as needed for anxiety, but we can consider it but we need to discuss that in person

## 2020-08-07 NOTE — Telephone Encounter (Signed)
escitalopram (LEXAPRO) 5 MG tablet Patient stated that the medication made her sad, and she only took it for two days and it caused her to have a headache both times she took it. She stopped taking it today and she did not have the headache.

## 2020-08-07 NOTE — Telephone Encounter (Signed)
Spoke with patient. Appointment offered. She will call back later and schedule if she decides she wants to go on another medication.

## 2020-08-07 NOTE — Telephone Encounter (Signed)
Noted - ok to talk about monday

## 2020-08-20 ENCOUNTER — Other Ambulatory Visit: Payer: Self-pay | Admitting: Internal Medicine

## 2020-08-21 ENCOUNTER — Ambulatory Visit: Payer: Medicare Other | Admitting: Obstetrics & Gynecology

## 2020-09-01 ENCOUNTER — Encounter (HOSPITAL_BASED_OUTPATIENT_CLINIC_OR_DEPARTMENT_OTHER): Payer: Self-pay | Admitting: Obstetrics & Gynecology

## 2020-09-01 ENCOUNTER — Other Ambulatory Visit (HOSPITAL_COMMUNITY)
Admission: RE | Admit: 2020-09-01 | Discharge: 2020-09-01 | Disposition: A | Discharge status: Home / Self Care | Payer: Medicare PPO | Source: Ambulatory Visit | Attending: Obstetrics & Gynecology | Admitting: Obstetrics & Gynecology

## 2020-09-01 ENCOUNTER — Ambulatory Visit (INDEPENDENT_AMBULATORY_CARE_PROVIDER_SITE_OTHER): Payer: Medicare PPO | Admitting: Obstetrics & Gynecology

## 2020-09-01 ENCOUNTER — Other Ambulatory Visit: Payer: Self-pay

## 2020-09-01 VITALS — BP 148/86 | HR 67 | Ht 63.0 in | Wt 153.6 lb

## 2020-09-01 DIAGNOSIS — E782 Mixed hyperlipidemia: Secondary | ICD-10-CM | POA: Diagnosis not present

## 2020-09-01 DIAGNOSIS — Z124 Encounter for screening for malignant neoplasm of cervix: Secondary | ICD-10-CM | POA: Insufficient documentation

## 2020-09-01 DIAGNOSIS — Z01419 Encounter for gynecological examination (general) (routine) without abnormal findings: Secondary | ICD-10-CM

## 2020-09-01 DIAGNOSIS — Z780 Asymptomatic menopausal state: Secondary | ICD-10-CM | POA: Diagnosis not present

## 2020-09-01 DIAGNOSIS — M8580 Other specified disorders of bone density and structure, unspecified site: Secondary | ICD-10-CM | POA: Diagnosis not present

## 2020-09-01 DIAGNOSIS — E039 Hypothyroidism, unspecified: Secondary | ICD-10-CM

## 2020-09-01 NOTE — Progress Notes (Signed)
75 y.o. G4P3 Widowed White or Caucasian female here for breast and pelvic exam.  I am also following her for hot flashes after stopping HRT.  She is doing better from this regard.  Denies vaginal bleeding.  Planning on having a 75th celebration this summer at [employer].  Patient's last menstrual period was 07/04/2000.          Sexually active: No.  H/O STD:  no  Health Maintenance: PCP:  Dr. Quay Burow.  Last wellness appt was 08/04/2020.  Did blood work at that appt:   Vaccines are up to date:  yes Colonoscopy:  2014, Dr. Ardis Hughs MMG:  05/2020 BMD:  04/2020, -1.4 Last pap smear:  2019 H/o abnormal pap smear:  no    reports that she quit smoking about 46 years ago. Her smoking use included cigarettes. She has a 12.00 pack-year smoking history. She has never used smokeless tobacco. She reports that she does not drink alcohol and does not use drugs.  Past Medical History:  Diagnosis Date  . Allergy    SEASONAL  . Fibroid    no surgery  . Heart murmur   . Hyperlipidemia   . Hypothyroidism   . Migraines   . Mild intermittent asthma 07/20/2015   pt is currently not taking any medications  . Osteopenia   . Sciatic nerve lesion 2018  . Vitamin D deficiency     Past Surgical History:  Procedure Laterality Date  . cortisone injections     in left hand  . DENTAL SURGERY    . DORSAL COMPARTMENT RELEASE Right 03/20/2018   Procedure: RIGHT OPEN  RELEASE DORSAL COMPARTMENT (DEQUERVAINS);  Surgeon: Daryll Brod, MD;  Location: [facility];  Service: Orthopedics;  Laterality: Right;  . LUMBAR FUSION     Dr Hal Neer  . POLYPECTOMY     uterine  . SEPTOPLASTY    . SINOSCOPY    . TUBAL LIGATION    . WRIST ARTHROSCOPY WITH FOVEAL TRIANGULAR FIBROCARTILAGE COMPLEX REPAIR Right 03/20/2018   Procedure: RIGHT WRIST ARTHROSCOPY WITH DEBRIDEMENT, SCAPHOLUNATE/TRIANGULAR FIBROCARTILAGE COMPLEX REPAIR;  Surgeon: Daryll Brod, MD;  Location: [facility];  Service: Orthopedics;   Laterality: Right;    Current Outpatient Medications  Medication Sig Dispense Refill  . albuterol (PROAIR HFA) 108 (90 Base) MCG/ACT inhaler INHALE 2 PUFFS EVERY 4 HOURS AS NEEDED FOR WHEEZING OR COUGHING SPELLS 18 g 2  . Ascorbic Acid (VITAMIN C) 1000 MG tablet Take 1,000 mg by mouth daily.    Marland Kitchen aspirin 81 MG tablet Take 81 mg by mouth daily.    Marland Kitchen azelastine (ASTELIN) 0.1 % nasal spray Use in each nostril as directed 90 mL 1  . Cholecalciferol (VITAMIN D3) 1000 UNITS CAPS Take by mouth daily.    . fluticasone (FLONASE) 50 MCG/ACT nasal spray SPRAY 2 SPRAYS INTO EACH NOSTRIL EVERY DAY 48 mL 1  . fluticasone (FLOVENT HFA) 44 MCG/ACT inhaler Inhale 2 puffs into the lungs 2 (two) times daily. 81.8 g 5  . folic acid (FOLVITE) 1 MG tablet Take 1 mg by mouth daily.    Marland Kitchen levothyroxine (SYNTHROID) 50 MCG tablet TAKE 2 TABLETS (100 MCG TOTAL) BY MOUTH DAILY. 180 tablet 1  . Multiple Vitamin (MULTIVITAMIN) tablet Take by mouth.    . pravastatin (PRAVACHOL) 20 MG tablet TAKE 1 TABLET BY MOUTH EVERY DAY 90 tablet 1   No current facility-administered medications for this visit.    Family History  Problem Relation Age of Onset  .  Hypertension Mother   . Heart disease Mother        CHF; AS  . Osteoporosis Mother   . Heart attack Mother 58  . Allergic rhinitis Mother   . Diabetes Father   . Stroke Father        onset in 63s  . Cancer Brother        malignant brain tumor  . Colon cancer Neg Hx   . Esophageal cancer Neg Hx   . Rectal cancer Neg Hx   . Stomach cancer Neg Hx   . Angioedema Neg Hx   . Asthma Neg Hx   . Eczema Neg Hx   . Immunodeficiency Neg Hx   . Urticaria Neg Hx   . Breast cancer Neg Hx     Review of Systems  All other systems reviewed and are negative.   Exam:   BP (!) 148/86   Pulse 67   Ht 5\' 3"  (1.6 m)   Wt 153 lb 9.6 oz (69.7 kg)   LMP 07/04/2000   SpO2 98%   BMI 27.21 kg/m   Height: 5\' 3"  (160 cm)  General appearance: alert, cooperative and appears  stated age Breasts: normal appearance, no masses or tenderness Abdomen: soft, non-tender; bowel sounds normal; no masses,  no organomegaly Lymph nodes: Cervical, supraclavicular, and axillary nodes normal.  No abnormal inguinal nodes palpated Neurologic: Grossly normal  Pelvic: External genitalia:  no lesions              Urethra:  normal appearing urethra with no masses, tenderness or lesions              Bartholins and Skenes: normal                 Vagina: normal appearing vagina with normal color and discharge, no lesions              Cervix: no lesions              Pap taken: Yes.   Bimanual Exam:  Uterus:  normal size, contour, position, consistency, mobility, non-tender              Adnexa: normal adnexa and no mass, fullness, tenderness               Rectovaginal: Confirms               Anus:  normal sphincter tone, no lesions  Chaperone, Britt Bottom, CMA, was present for exam.  Assessment/Plan: 1. Encntr for gyn exam (general) (routine) w/o abn findings - pap smear obtained today.  This will be the last screening pap smear unless pt has future vaginal bleeding - MMG 05/2020 - BMD 04/2020 - Colonoscopy 2104, follow up 10 years - Vaccines are up to date:  yes - Blood work done with Dr. Quay Burow  2. Postmenopausal - off HRT  3. Mixed hyperlipidemia - followed by Dr. Quay Burow  4. Osteopenia, unspecified location - repeat BMD 2-3 years  5. Acquired hypothyroidism - followed by Dr. Quay Burow  21 minutes of total time was spent for this patient encounter, including preparation, face-to-face counseling with the patient and coordination of care, and documentation of the encounter.

## 2020-09-02 LAB — CYTOLOGY - PAP: Diagnosis: NEGATIVE

## 2020-09-05 ENCOUNTER — Other Ambulatory Visit: Payer: Self-pay | Admitting: Internal Medicine

## 2020-09-10 ENCOUNTER — Ambulatory Visit: Payer: Medicare PPO | Admitting: Allergy and Immunology

## 2020-09-11 ENCOUNTER — Telehealth (HOSPITAL_BASED_OUTPATIENT_CLINIC_OR_DEPARTMENT_OTHER): Payer: Self-pay | Admitting: Obstetrics & Gynecology

## 2020-09-11 NOTE — Telephone Encounter (Signed)
Opened in error

## 2020-09-18 DIAGNOSIS — H2513 Age-related nuclear cataract, bilateral: Secondary | ICD-10-CM | POA: Diagnosis not present

## 2020-09-18 DIAGNOSIS — H524 Presbyopia: Secondary | ICD-10-CM | POA: Diagnosis not present

## 2020-09-18 DIAGNOSIS — H43813 Vitreous degeneration, bilateral: Secondary | ICD-10-CM | POA: Diagnosis not present

## 2020-09-18 DIAGNOSIS — H04123 Dry eye syndrome of bilateral lacrimal glands: Secondary | ICD-10-CM | POA: Diagnosis not present

## 2020-10-04 NOTE — Progress Notes (Signed)
Subjective:    Patient ID: Pamela Rich, female    DOB: April 26, 1946, 75 y.o.   MRN: [id number]  HPI The patient is here for an acute visit - recently her BP has been intermittently hgh    Medications and allergies reviewed with patient and updated if appropriate.  Patient Active Problem List   Diagnosis Date Noted  . Anxiety 08/04/2020  . Medial epicondylitis of elbow, left 11/22/2019  . PAD (peripheral artery disease) ([place]) 04/01/2019  . De Quervain's syndrome (tenosynovitis) 03/12/2018  . Hearing loss, wears hearing aids 06/22/2017  . Radicular pain of left lower extremity 02/28/2017  . Chronic pain of both knees 06/21/2016  . Mild intermittent asthma 07/20/2015  . Other allergic rhinitis 07/20/2015  . Mixed hyperlipidemia 03/01/2012  . Migraine without aura 04/05/2010  . Vitamin D deficiency 03/19/2009  . DEGENERATIVE JOINT DISEASE 12/05/2007  . Hypothyroidism 01/22/2007  . IBS 09/28/2006  . Osteopenia 09/28/2006    Current Outpatient Medications on File Prior to Visit  Medication Sig Dispense Refill  . albuterol (PROAIR HFA) 108 (90 Base) MCG/ACT inhaler INHALE 2 PUFFS EVERY 4 HOURS AS NEEDED FOR WHEEZING OR COUGHING SPELLS 18 g 2  . Ascorbic Acid (VITAMIN C) 1000 MG tablet Take 1,000 mg by mouth daily.    Marland Kitchen aspirin 81 MG tablet Take 81 mg by mouth daily.    Marland Kitchen azelastine (ASTELIN) 0.1 % nasal spray Use in each nostril as directed 90 mL 1  . Cholecalciferol (VITAMIN D3) 1000 UNITS CAPS Take by mouth daily.    . fluticasone (FLONASE) 50 MCG/ACT nasal spray SPRAY 2 SPRAYS INTO EACH NOSTRIL EVERY DAY 48 mL 1  . fluticasone (FLOVENT HFA) 44 MCG/ACT inhaler Inhale 2 puffs into the lungs 2 (two) times daily. 41.7 g 5  . folic acid (FOLVITE) 1 MG tablet Take 1 mg by mouth daily.    Marland Kitchen levothyroxine (SYNTHROID) 50 MCG tablet TAKE 2 TABLETS (100 MCG TOTAL) BY MOUTH DAILY. 180 tablet 1  . Multiple Vitamin (MULTIVITAMIN) tablet Take by mouth.    . pravastatin (PRAVACHOL) 20  MG tablet TAKE 1 TABLET BY MOUTH EVERY DAY 90 tablet 1   No current facility-administered medications on file prior to visit.    Past Medical History:  Diagnosis Date  . Allergy    SEASONAL  . Fibroid    no surgery  . Heart murmur   . Hyperlipidemia   . Hypothyroidism   . Migraines   . Mild intermittent asthma 07/20/2015   pt is currently not taking any medications  . Osteopenia   . Sciatic nerve lesion 2018  . Vitamin D deficiency     Past Surgical History:  Procedure Laterality Date  . DENTAL SURGERY    . DORSAL COMPARTMENT RELEASE Right 03/20/2018   Procedure: RIGHT OPEN  RELEASE DORSAL COMPARTMENT (DEQUERVAINS);  Surgeon: Daryll Brod, MD;  Location: [facility];  Service: Orthopedics;  Laterality: Right;  . HYSTEROSCOPY WITH D & C    . LUMBAR FUSION     Dr Hal Neer  . SEPTOPLASTY    . SINOSCOPY    . TUBAL LIGATION    . WRIST ARTHROSCOPY WITH FOVEAL TRIANGULAR FIBROCARTILAGE COMPLEX REPAIR Right 03/20/2018   Procedure: RIGHT WRIST ARTHROSCOPY WITH DEBRIDEMENT, SCAPHOLUNATE/TRIANGULAR FIBROCARTILAGE COMPLEX REPAIR;  Surgeon: Daryll Brod, MD;  Location: [facility];  Service: Orthopedics;  Laterality: Right;    Social History   Socioeconomic History  . Marital status: Widowed    Spouse name: Not  on file  . Number of children: 3  . Years of education: Not on file  . Highest education level: Not on file  Occupational History  . Occupation: retired  Tobacco Use  . Smoking status: Former Smoker    Packs/day: 0.30    Years: 40.00    Pack years: 12.00    Types: Cigarettes    Quit date: 07/04/1974    Years since quitting: 46.2  . Smokeless tobacco: Never Used  . Tobacco comment: smoked 1965-1976 , < 3 cig/ day  Vaping Use  . Vaping Use: Never used  Substance and Sexual Activity  . Alcohol use: No    Alcohol/week: 0.0 standard drinks    Comment: rarely  . Drug use: No  . Sexual activity: Not Currently    Partners: Male    Birth  control/protection: Surgical, Post-menopausal    Comment: BTL  Other Topics Concern  . Not on file  Social History Narrative   Exercise: walking, stretching, weights   Social Determinants of Health   Financial Resource Strain: Not on file  Food Insecurity: Not on file  Transportation Needs: Not on file  Physical Activity: Not on file  Stress: Not on file  Social Connections: Not on file    Family History  Problem Relation Age of Onset  . Hypertension Mother   . Heart disease Mother        CHF; AS  . Osteoporosis Mother   . Heart attack Mother 57  . Allergic rhinitis Mother   . Diabetes Father   . Stroke Father        onset in 102s  . Cancer Brother        malignant brain tumor  . Colon cancer Neg Hx   . Esophageal cancer Neg Hx   . Rectal cancer Neg Hx   . Stomach cancer Neg Hx   . Angioedema Neg Hx   . Asthma Neg Hx   . Eczema Neg Hx   . Immunodeficiency Neg Hx   . Urticaria Neg Hx   . Breast cancer Neg Hx     Review of Systems     Objective:  There were no vitals filed for this visit. BP Readings from Last 3 Encounters:  09/01/20 (!) 148/86  08/04/20 124/72  03/12/20 132/86   Wt Readings from Last 3 Encounters:  09/01/20 153 lb 9.6 oz (69.7 kg)  08/04/20 151 lb (68.5 kg)  01/22/20 149 lb 3.2 oz (67.7 kg)   There is no height or weight on file to calculate BMI.   Physical Exam    Constitutional: Appears well-developed and well-nourished. No distress.  HENT:  Head: Normocephalic and atraumatic.  Neck: Neck supple. No tracheal deviation present. No thyromegaly present.  No cervical lymphadenopathy Cardiovascular: Normal rate, regular rhythm and normal heart sounds.   No murmur heard. No carotid bruit .  No edema Pulmonary/Chest: Effort normal and breath sounds normal. No respiratory distress. No has no wheezes. No rales.  Skin: Skin is warm and dry. Not diaphoretic.  Psychiatric: Normal mood and affect. Behavior is normal.      Assessment &  Plan:    See Problem List for Assessment and Plan of chronic medical problems.    This visit occurred during the SARS-CoV-2 public health emergency.  Safety protocols were in place, including screening questions prior to the visit, additional usage of staff PPE, and extensive cleaning of exam room while observing appropriate contact time as indicated for disinfecting solutions.  This encounter was created in error - please disregard.

## 2020-10-04 NOTE — Patient Instructions (Signed)
  Blood work was ordered.     Medications changes include :     Your prescription(s) have been submitted to your pharmacy. Please take as directed and contact our office if you believe you are having problem(s) with the medication(s).   A referral was ordered for        Someone from their office will call you to schedule an appointment.    Please followup in 6 months  

## 2020-10-05 ENCOUNTER — Encounter: Payer: Medicare PPO | Admitting: Internal Medicine

## 2020-11-05 DIAGNOSIS — J4530 Mild persistent asthma, uncomplicated: Secondary | ICD-10-CM | POA: Diagnosis not present

## 2020-11-05 DIAGNOSIS — J3089 Other allergic rhinitis: Secondary | ICD-10-CM | POA: Diagnosis not present

## 2020-11-05 DIAGNOSIS — H6061 Unspecified chronic otitis externa, right ear: Secondary | ICD-10-CM | POA: Diagnosis not present

## 2020-11-05 DIAGNOSIS — Z87891 Personal history of nicotine dependence: Secondary | ICD-10-CM | POA: Diagnosis not present

## 2020-12-08 ENCOUNTER — Encounter: Payer: Self-pay | Admitting: Internal Medicine

## 2020-12-08 NOTE — Progress Notes (Signed)
Subjective:    Patient ID: Pamela Rich, female    DOB: 08/05/1945, 75 y.o.   MRN: [id number]  HPI The patient is here for an acute visit.   Headaches - she was having daily headaches.  She was concerned it was her bp.  Once she made the appt the headaches went away.  She thinks she had them for over two months. She wonders if they were related to her allergies.     Her stomach has changed - she has a longer list of foods that she can not eat.  If she does not rest well her stomach can be affected.  Onions and strawberries affect her stomach.  The carbs do not affect her stomach.  She does not do good with a lot of salads or other veges.     She will have stomach cramps and have to go to the bathroom more often.  The stool is formed.  She does good with breakfast.       Medications and allergies reviewed with patient and updated if appropriate.  Patient Active Problem List   Diagnosis Date Noted  . Anxiety 08/04/2020  . Medial epicondylitis of elbow, left 11/22/2019  . PAD (peripheral artery disease) ([place]) 04/01/2019  . De Quervain's syndrome (tenosynovitis) 03/12/2018  . Hearing loss, wears hearing aids 06/22/2017  . Radicular pain of left lower extremity 02/28/2017  . Chronic pain of both knees 06/21/2016  . Mild intermittent asthma 07/20/2015  . Other allergic rhinitis 07/20/2015  . Mixed hyperlipidemia 03/01/2012  . Migraine without aura 04/05/2010  . Vitamin D deficiency 03/19/2009  . DEGENERATIVE JOINT DISEASE 12/05/2007  . Hypothyroidism 01/22/2007  . IBS 09/28/2006  . Osteopenia 09/28/2006    Current Outpatient Medications on File Prior to Visit  Medication Sig Dispense Refill  . albuterol (PROAIR HFA) 108 (90 Base) MCG/ACT inhaler INHALE 2 PUFFS EVERY 4 HOURS AS NEEDED FOR WHEEZING OR COUGHING SPELLS 18 g 2  . Ascorbic Acid (VITAMIN C) 1000 MG tablet Take 1,000 mg by mouth daily.    Marland Kitchen aspirin 81 MG tablet Take 81 mg by mouth daily.    Marland Kitchen azelastine  (ASTELIN) 0.1 % nasal spray Use in each nostril as directed 90 mL 1  . Cholecalciferol (VITAMIN D3) 1000 UNITS CAPS Take by mouth daily.    . fluticasone (FLONASE) 50 MCG/ACT nasal spray SPRAY 2 SPRAYS INTO EACH NOSTRIL EVERY DAY 48 mL 1  . fluticasone (FLOVENT HFA) 44 MCG/ACT inhaler Inhale 2 puffs into the lungs 2 (two) times daily. 56.2 g 5  . folic acid (FOLVITE) 1 MG tablet Take 1 mg by mouth daily.    Marland Kitchen levothyroxine (SYNTHROID) 50 MCG tablet TAKE 2 TABLETS (100 MCG TOTAL) BY MOUTH DAILY. 180 tablet 1  . Multiple Vitamin (MULTIVITAMIN) tablet Take by mouth.    . pravastatin (PRAVACHOL) 20 MG tablet TAKE 1 TABLET BY MOUTH EVERY DAY 90 tablet 1   No current facility-administered medications on file prior to visit.    Past Medical History:  Diagnosis Date  . Allergy    SEASONAL  . Fibroid    no surgery  . Heart murmur   . Hyperlipidemia   . Hypothyroidism   . Migraines   . Mild intermittent asthma 07/20/2015   pt is currently not taking any medications  . Osteopenia   . Sciatic nerve lesion 2018  . Vitamin D deficiency     Past Surgical History:  Procedure Laterality Date  . DENTAL SURGERY    .  DORSAL COMPARTMENT RELEASE Right 03/20/2018   Procedure: RIGHT OPEN  RELEASE DORSAL COMPARTMENT (DEQUERVAINS);  Surgeon: Daryll Brod, MD;  Location: [facility];  Service: Orthopedics;  Laterality: Right;  . HYSTEROSCOPY WITH D & C    . LUMBAR FUSION     Dr Hal Neer  . SEPTOPLASTY    . SINOSCOPY    . TUBAL LIGATION    . WRIST ARTHROSCOPY WITH FOVEAL TRIANGULAR FIBROCARTILAGE COMPLEX REPAIR Right 03/20/2018   Procedure: RIGHT WRIST ARTHROSCOPY WITH DEBRIDEMENT, SCAPHOLUNATE/TRIANGULAR FIBROCARTILAGE COMPLEX REPAIR;  Surgeon: Daryll Brod, MD;  Location: [facility];  Service: Orthopedics;  Laterality: Right;    Social History   Socioeconomic History  . Marital status: Widowed    Spouse name: Not on file  . Number of children: 3  . Years of education:  Not on file  . Highest education level: Not on file  Occupational History  . Occupation: retired  Tobacco Use  . Smoking status: Former Smoker    Packs/day: 0.30    Years: 40.00    Pack years: 12.00    Types: Cigarettes    Quit date: 07/04/1974    Years since quitting: 46.4  . Smokeless tobacco: Never Used  . Tobacco comment: smoked 1965-1976 , < 3 cig/ day  Vaping Use  . Vaping Use: Never used  Substance and Sexual Activity  . Alcohol use: No    Alcohol/week: 0.0 standard drinks    Comment: rarely  . Drug use: No  . Sexual activity: Not Currently    Partners: Male    Birth control/protection: Surgical, Post-menopausal    Comment: BTL  Other Topics Concern  . Not on file  Social History Narrative   Exercise: walking, stretching, weights   Social Determinants of Health   Financial Resource Strain: Low Risk   . Difficulty of Paying Living Expenses: Not hard at all  Food Insecurity: No Food Insecurity  . Worried About Charity fundraiser in the Last Year: Never true  . Ran Out of Food in the Last Year: Never true  Transportation Needs: No Transportation Needs  . Lack of Transportation (Medical): No  . Lack of Transportation (Non-Medical): No  Physical Activity: Sufficiently Active  . Days of Exercise per Week: 7 days  . Minutes of Exercise per Session: 60 min  Stress: No Stress Concern Present  . Feeling of Stress : Not at all  Social Connections: Moderately Integrated  . Frequency of Communication with Friends and Family: More than three times a week  . Frequency of Social Gatherings with Friends and Family: More than three times a week  . Attends Religious Services: More than 4 times per year  . Active Member of Clubs or Organizations: Yes  . Attends Archivist Meetings: More than 4 times per year  . Marital Status: Widowed    Family History  Problem Relation Age of Onset  . Hypertension Mother   . Heart disease Mother        CHF; AS  . Osteoporosis  Mother   . Heart attack Mother 43  . Allergic rhinitis Mother   . Diabetes Father   . Stroke Father        onset in 59s  . Cancer Brother        malignant brain tumor  . Colon cancer Neg Hx   . Esophageal cancer Neg Hx   . Rectal cancer Neg Hx   . Stomach cancer Neg Hx   . Angioedema Neg  Hx   . Asthma Neg Hx   . Eczema Neg Hx   . Immunodeficiency Neg Hx   . Urticaria Neg Hx   . Breast cancer Neg Hx     Review of Systems  Constitutional: Negative for fever.  Gastrointestinal: Positive for abdominal pain (cramping intermittently with increased BMs) and constipation (occ). Negative for blood in stool, diarrhea and nausea.       Occ gerd  Neurological: Negative for headaches.       Objective:   Vitals:   12/09/20 1456  BP: 122/80  Pulse: 72  Temp: 98.3 F (36.8 C)  SpO2: 98%   BP Readings from Last 3 Encounters:  12/09/20 122/80  12/09/20 122/80  09/01/20 (!) 148/86   Wt Readings from Last 3 Encounters:  12/09/20 154 lb 12.8 oz (70.2 kg)  12/09/20 154 lb 12.8 oz (70.2 kg)  09/01/20 153 lb 9.6 oz (69.7 kg)   Body mass index is 27.42 kg/m.   Physical Exam    Constitutional: Appears well-developed and well-nourished. No distress.  Head: Normocephalic and atraumatic.  Neck: Neck supple. No tracheal deviation present. No thyromegaly present.  No cervical lymphadenopathy Cardiovascular: Normal rate, regular rhythm and normal heart sounds.  No murmur heard. No carotid bruit .  No edema Pulmonary/Chest: Effort normal and breath sounds normal. No respiratory distress. No has no wheezes. No rales.  Abd: soft, NT, ND Skin: Skin is warm and dry. Not diaphoretic.  Psychiatric: Normal mood and affect. Behavior is normal.    Lab Results  Component Value Date   WBC 8.7 08/04/2020   HGB 13.8 08/04/2020   HCT 41.0 08/04/2020   PLT 345.0 08/04/2020   GLUCOSE 86 08/04/2020   CHOL 185 08/04/2020   TRIG 117.0 08/04/2020   HDL 69.30 08/04/2020   LDLDIRECT 151.9  12/05/2007   LDLCALC 92 08/04/2020   ALT 11 08/04/2020   AST 21 08/04/2020   NA 138 08/04/2020   K 3.6 08/04/2020   CL 101 08/04/2020   CREATININE 0.81 08/04/2020   BUN 16 08/04/2020   CO2 32 08/04/2020   TSH 1.56 08/04/2020   MICROALBUR 0.2 01/22/2007       Assessment & Plan:    I spent 20 minutes dedicated to the care of this patient on the date of this encounter including review of recent labs, imaging and procedures ( last colonoscopy), speciality notes, obtaining history, communicating with the patient,discussing possible causes of her symptoms and things she can try, and documenting clinical information in the EHR   See Problem List for Assessment and Plan of chronic medical problems.    This visit occurred during the SARS-CoV-2 public health emergency.  Safety protocols were in place, including screening questions prior to the visit, additional usage of staff PPE, and extensive cleaning of exam room while observing appropriate contact time as indicated for disinfecting solutions.

## 2020-12-09 ENCOUNTER — Ambulatory Visit: Payer: Medicare PPO | Admitting: Internal Medicine

## 2020-12-09 ENCOUNTER — Ambulatory Visit (INDEPENDENT_AMBULATORY_CARE_PROVIDER_SITE_OTHER): Payer: Medicare PPO

## 2020-12-09 ENCOUNTER — Other Ambulatory Visit: Payer: Self-pay

## 2020-12-09 VITALS — BP 122/80 | HR 72 | Temp 98.3°F | Resp 16 | Ht 63.0 in | Wt 154.8 lb

## 2020-12-09 VITALS — BP 122/80 | HR 72 | Temp 98.3°F | Ht 63.0 in | Wt 154.8 lb

## 2020-12-09 DIAGNOSIS — R519 Headache, unspecified: Secondary | ICD-10-CM | POA: Diagnosis not present

## 2020-12-09 DIAGNOSIS — K589 Irritable bowel syndrome without diarrhea: Secondary | ICD-10-CM

## 2020-12-09 DIAGNOSIS — Z0000 Encounter for general adult medical examination without abnormal findings: Secondary | ICD-10-CM

## 2020-12-09 NOTE — Patient Instructions (Signed)
    Try taking metamucil daily.   If there is no improvement we can consider a referral to GI.

## 2020-12-09 NOTE — Assessment & Plan Note (Signed)
Was having daily headaches for almost two months, but they have resolved ? Related to allergies Reassured her BP is good and unlikely related to BP

## 2020-12-09 NOTE — Progress Notes (Signed)
Subjective:   Pamela Rich is a 75 y.o. female who presents for Medicare Annual (Subsequent) preventive examination.  Review of Systems    No ROS. Medicare Wellness Visit. Additional risk factors are reflected in social history. Cardiac Risk Factors include: advanced age (>14men, >23 women);dyslipidemia;family history of premature cardiovascular disease   Sleep Patterns: No sleep issues, feels rested on waking and sleeps 8 hours nightly. Home Safety/Smoke Alarms: Feels safe in home; uses home alarm. Smoke alarms in place. Living environment: 1-story home; Lives alone; no needs for DME; good support system. Seat Belt Safety/Bike Helmet: Wears seat belt.    Objective:    Today's Vitals   12/09/20 1352  BP: 122/80  Pulse: 72  Resp: 16  Temp: 98.3 F (36.8 C)  TempSrc: Temporal  SpO2: 98%  Weight: 154 lb 12.8 oz (70.2 kg)  Height: 5\' 3"  (1.6 m)  PainSc: 0-No pain   Body mass index is 27.42 kg/m.  Advanced Directives 12/09/2020 01/07/2020 10/14/2019 08/30/2018 03/20/2018 03/12/2018 06/21/2016  Does Patient Have a Medical Advance Directive? Yes Yes Yes Yes Yes Yes Yes  Type of Paramedic of [place];Living will [place];Living will [place];Living will [place];Living will [place];Living will [place];Living will Living will;Healthcare Power of Attorney  Does patient want to make changes to medical advance directive? No - Patient declined No - Patient declined No - Patient declined - - No - Patient declined -  Copy of [place] in Chart? No - copy requested Yes - validated most recent copy scanned in chart (See row information) - No - copy requested No - copy requested No - copy requested Yes    Current Medications (verified) Outpatient Encounter Medications as of 12/09/2020  Medication Sig  . albuterol (PROAIR HFA) 108 (90 Base) MCG/ACT  inhaler INHALE 2 PUFFS EVERY 4 HOURS AS NEEDED FOR WHEEZING OR COUGHING SPELLS  . Ascorbic Acid (VITAMIN C) 1000 MG tablet Take 1,000 mg by mouth daily.  Marland Kitchen aspirin 81 MG tablet Take 81 mg by mouth daily.  Marland Kitchen azelastine (ASTELIN) 0.1 % nasal spray Use in each nostril as directed  . Cholecalciferol (VITAMIN D3) 1000 UNITS CAPS Take by mouth daily.  . fluticasone (FLONASE) 50 MCG/ACT nasal spray SPRAY 2 SPRAYS INTO EACH NOSTRIL EVERY DAY  . fluticasone (FLOVENT HFA) 44 MCG/ACT inhaler Inhale 2 puffs into the lungs 2 (two) times daily.  . folic acid (FOLVITE) 1 MG tablet Take 1 mg by mouth daily.  Marland Kitchen levothyroxine (SYNTHROID) 50 MCG tablet TAKE 2 TABLETS (100 MCG TOTAL) BY MOUTH DAILY.  . Multiple Vitamin (MULTIVITAMIN) tablet Take by mouth.  . pravastatin (PRAVACHOL) 20 MG tablet TAKE 1 TABLET BY MOUTH EVERY DAY   No facility-administered encounter medications on file as of 12/09/2020.    Allergies (verified) Lexapro [escitalopram]   History: Past Medical History:  Diagnosis Date  . Allergy    SEASONAL  . Fibroid    no surgery  . Heart murmur   . Hyperlipidemia   . Hypothyroidism   . Migraines   . Mild intermittent asthma 07/20/2015   pt is currently not taking any medications  . Osteopenia   . Sciatic nerve lesion 2018  . Vitamin D deficiency    Past Surgical History:  Procedure Laterality Date  . DENTAL SURGERY    . DORSAL COMPARTMENT RELEASE Right 03/20/2018   Procedure: RIGHT OPEN  RELEASE DORSAL COMPARTMENT (DEQUERVAINS);  Surgeon: Daryll Brod, MD;  Location: [facility];  Service: Orthopedics;  Laterality: Right;  . HYSTEROSCOPY WITH D & C    . LUMBAR FUSION     Dr Hal Neer  . SEPTOPLASTY    . SINOSCOPY    . TUBAL LIGATION    . WRIST ARTHROSCOPY WITH FOVEAL TRIANGULAR FIBROCARTILAGE COMPLEX REPAIR Right 03/20/2018   Procedure: RIGHT WRIST ARTHROSCOPY WITH DEBRIDEMENT, SCAPHOLUNATE/TRIANGULAR FIBROCARTILAGE COMPLEX REPAIR;  Surgeon: Daryll Brod, MD;  Location:  [facility];  Service: Orthopedics;  Laterality: Right;   Family History  Problem Relation Age of Onset  . Hypertension Mother   . Heart disease Mother        CHF; AS  . Osteoporosis Mother   . Heart attack Mother 53  . Allergic rhinitis Mother   . Diabetes Father   . Stroke Father        onset in 38s  . Cancer Brother        malignant brain tumor  . Colon cancer Neg Hx   . Esophageal cancer Neg Hx   . Rectal cancer Neg Hx   . Stomach cancer Neg Hx   . Angioedema Neg Hx   . Asthma Neg Hx   . Eczema Neg Hx   . Immunodeficiency Neg Hx   . Urticaria Neg Hx   . Breast cancer Neg Hx    Social History   Socioeconomic History  . Marital status: Widowed    Spouse name: Not on file  . Number of children: 3  . Years of education: Not on file  . Highest education level: Not on file  Occupational History  . Occupation: retired  Tobacco Use  . Smoking status: Former Smoker    Packs/day: 0.30    Years: 40.00    Pack years: 12.00    Types: Cigarettes    Quit date: 07/04/1974    Years since quitting: 46.4  . Smokeless tobacco: Never Used  . Tobacco comment: smoked 1965-1976 , < 3 cig/ day  Vaping Use  . Vaping Use: Never used  Substance and Sexual Activity  . Alcohol use: No    Alcohol/week: 0.0 standard drinks    Comment: rarely  . Drug use: No  . Sexual activity: Not Currently    Partners: Male    Birth control/protection: Surgical, Post-menopausal    Comment: BTL  Other Topics Concern  . Not on file  Social History Narrative   Exercise: walking, stretching, weights   Social Determinants of Health   Financial Resource Strain: Low Risk   . Difficulty of Paying Living Expenses: Not hard at all  Food Insecurity: No Food Insecurity  . Worried About Charity fundraiser in the Last Year: Never true  . Ran Out of Food in the Last Year: Never true  Transportation Needs: No Transportation Needs  . Lack of Transportation (Medical): No  . Lack of  Transportation (Non-Medical): No  Physical Activity: Sufficiently Active  . Days of Exercise per Week: 7 days  . Minutes of Exercise per Session: 60 min  Stress: No Stress Concern Present  . Feeling of Stress : Not at all  Social Connections: Moderately Integrated  . Frequency of Communication with Friends and Family: More than three times a week  . Frequency of Social Gatherings with Friends and Family: More than three times a week  . Attends Religious Services: More than 4 times per year  . Active Member of Clubs or Organizations: Yes  . Attends  Club or Organization Meetings: More than 4 times per year  . Marital Status: Widowed    Tobacco Counseling Counseling given: Not Answered Comment: smoked 1965-1976 , < 3 cig/ day   Clinical Intake:  Pre-visit preparation completed: Yes  Pain : No/denies pain Pain Score: 0-No pain     BMI - recorded: 27.42 Nutritional Status: BMI 25 -29 Overweight Nutritional Risks: Other (Comment) (IBS) Diabetes: No  How often do you need to have someone help you when you read instructions, pamphlets, or other written materials from your doctor or pharmacy?: 1 - Never What is the last grade level you completed in school?: [place]; some college courses  Diabetic? no  Interpreter Needed?: No  Information entered by :: Lisette Abu, LPN   Activities of Daily Living In your present state of health, do you have any difficulty performing the following activities: 12/09/2020  Hearing? Y  Comment wears hearing aids  Vision? Y  Comment cataracts are forming on both eyes  Difficulty concentrating or making decisions? N  Walking or climbing stairs? N  Dressing or bathing? N  Doing errands, shopping? N  Preparing Food and eating ? N  Using the Toilet? N  In the past six months, have you accidently leaked urine? N  Do you have problems with loss of bowel control? N  Managing your Medications? N  Managing your Finances? N   Housekeeping or managing your Housekeeping? N  Some recent data might be hidden    Patient Care Team: Binnie Rail, MD as PCP - General (Internal Medicine) Bobbitt, [place] Muta, MD (Inactive) as Consulting Physician (Allergy and Immunology) Megan Salon, MD as Consulting Physician (Gynecology) Danella Sensing, MD as Consulting Physician (Dermatology) Ralene Bathe, MD as Consulting Physician (Ophthalmology)  Indicate any recent Medical Services you may have received from other than [facility] providers in the past year (date may be approximate).     Assessment:   This is a routine wellness examination for [place].  Hearing/Vision screen No exam data present  Dietary issues and exercise activities discussed: Current Exercise Habits: Home exercise routine, Type of exercise: walking;strength training/weights;stretching;yoga;Other - see comments (walks in the park everyday for 2 miles, goes to gym to do yoga class, streching and doing free weights.), Time (Minutes): 60, Frequency (Times/Week): 7, Weekly Exercise (Minutes/Week): 420, Intensity: Moderate, Exercise limited by: orthopedic condition(s);respiratory conditions(s)  Goals Addressed            This Visit's Progress   . Patient Stated       My goal is to lose a few pounds, continue to be physically active and independent.       Depression Screen PHQ 2/9 Scores 12/09/2020 10/14/2019 08/30/2018 07/11/2018 06/22/2017 06/21/2016 05/06/2015  PHQ - 2 Score 0 0 0 0 0 0 0    Fall Risk Fall Risk  12/09/2020 10/14/2019 08/30/2018 07/11/2018 06/22/2017  Falls in the past year? 0 0 0 1 Yes  Number falls in past yr: 0 0 0 0 1  Injury with Fall? 0 0 - - Yes  Risk for fall due to : No Fall Risks No Fall Risks - - -  Follow up Falls evaluation completed Falls evaluation completed;Education provided;Falls prevention discussed - - -    FALL RISK PREVENTION PERTAINING TO THE HOME:  Any stairs in or around the home? Yes  (to the attic) If so,  are there any without handrails? No  Home free of loose throw rugs in walkways, pet beds, electrical cords,  etc? Yes  Adequate lighting in your home to reduce risk of falls? Yes   ASSISTIVE DEVICES UTILIZED TO PREVENT FALLS:  Life alert? No  Use of a cane, walker or w/c? No  Grab bars in the bathroom? Yes  Shower chair or bench in shower? Yes  Elevated toilet seat or a handicapped toilet? Yes   TIMED UP AND GO:  Was the test performed? No .  Length of time to ambulate 10 feet: 0 sec.   Gait steady and fast without use of assistive device  Cognitive Function: Normal cognitive status assessed by direct observation by this Nurse Health Advisor. No abnormalities found.       6CIT Screen 10/14/2019  What Year? 0 points  What month? 0 points  What time? 0 points  Count back from 20 0 points  Months in reverse 0 points  Repeat phrase 0 points  Total Score 0    Immunizations Immunization History  Administered Date(s) Administered  . Fluad Quad(high Dose 65+) 03/14/2019  . Influenza Split 04/06/2011, 04/18/2012  . Influenza Whole 04/18/2007, 04/23/2008, 04/08/2009, 03/26/2010  . Influenza, High Dose Seasonal PF 04/16/2013, 03/24/2016, 04/04/2017, 04/13/2018, 03/16/2020  . Influenza,inj,Quad PF,6+ Mos 03/18/2014, 03/24/2015  . PFIZER Comirnaty(Gray Top)Covid-19 Tri-Sucrose Vaccine 10/19/2020  . PFIZER(Purple Top)SARS-COV-2 Vaccination 08/10/2019, 08/31/2019, 03/23/2020  . Pneumococcal Conjugate-13 11/04/2014  . Pneumococcal Polysaccharide-23 01/22/2007, 11/12/2012  . Tdap 07/26/2012  . Zoster, Live 09/24/2007    TDAP status: Up to date  Flu Vaccine status: Up to date  Pneumococcal vaccine status: Up to date  Covid-19 vaccine status: Completed vaccines  Qualifies for Shingles Vaccine? Yes   Zostavax completed Yes   Shingrix Completed?: No.    Education has been provided regarding the importance of this vaccine. Patient has been advised to call insurance company to  determine out of pocket expense if they have not yet received this vaccine. Advised may also receive vaccine at local pharmacy or Health Dept. Verbalized acceptance and understanding.  Screening Tests Health Maintenance  Topic Date Due  . Zoster Vaccines- Shingrix (1 of 2) Never done  . Pneumococcal Vaccine 19-22 Years old (1 of 2 - PPSV23) Never done  . INFLUENZA VACCINE  02/01/2021  . TETANUS/TDAP  07/26/2022  . COLONOSCOPY (Pts 45-56yrs Insurance coverage will need to be confirmed)  12/26/2022  . DEXA SCAN  04/17/2023  . COVID-19 Vaccine  Completed  . Hepatitis C Screening  Completed  . PNA vac Low Risk Adult  Completed  . HPV VACCINES  Aged Out    Health Maintenance  Health Maintenance Due  Topic Date Due  . Zoster Vaccines- Shingrix (1 of 2) Never done  . Pneumococcal Vaccine 60-79 Years old (1 of 2 - PPSV23) Never done    Colorectal cancer screening: Type of screening: Colonoscopy. Completed 12/25/2012. Repeat every 10 years  Mammogram status: Completed 05/08/2020. Repeat every year  Bone Density status: Completed 04/16/2020. Results reflect: Bone density results: OSTEOPENIA. Repeat every 2-3 years.  Lung Cancer Screening: (Low Dose CT Chest recommended if Age 62-80 years, 30 pack-year currently smoking OR have quit w/in 15years.) does not qualify.   Lung Cancer Screening Referral: no  Additional Screening:  Hepatitis C Screening: does qualify; Completed yes  Vision Screening: Recommended annual ophthalmology exams for early detection of glaucoma and other disorders of the eye. Is the patient up to date with their annual eye exam?  Yes  Who is the provider or what is the name of the office in which the patient attends  annual eye exams? Marygrace Drought, MD. If pt is not established with a provider, would they like to be referred to a provider to establish care? No .   Dental Screening: Recommended annual dental exams for proper oral hygiene  Community Resource Referral /  Chronic Care Management: CRR required this visit?  No   CCM required this visit?  No      Plan:     I have personally reviewed and noted the following in the patient's chart:   . Medical and social history . Use of alcohol, tobacco or illicit drugs  . Current medications and supplements including opioid prescriptions.  . Functional ability and status . Nutritional status . Physical activity . Advanced directives . List of other physicians . Hospitalizations, surgeries, and ER visits in previous 12 months . Vitals . Screenings to include cognitive, depression, and falls . Referrals and appointments  In addition, I have reviewed and discussed with patient certain preventive protocols, quality metrics, and best practice recommendations. A written personalized care plan for preventive services as well as general preventive health recommendations were provided to patient.     Sheral Flow, LPN   08/08/1751   Nurse Notes:  Medications reviewed with patient; no opioid use noted.

## 2020-12-09 NOTE — Assessment & Plan Note (Signed)
Chronic Has known IBS Recently has had abd cramping and increased BM's after certain foods - mostly veges, onions No diarrhea, occ constipation Taking a probiotic daily Start metamucil daily and see if that helps.   Can limit foods that tend to cause the symptoms If no improvement can consider gi referral

## 2020-12-09 NOTE — Patient Instructions (Signed)
Pamela Rich , Thank you for taking time to come for your Medicare Wellness Visit. I appreciate your ongoing commitment to your health goals. Please review the following plan we discussed and let me know if I can assist you in the future.   Screening recommendations/referrals: Colonoscopy: 12/25/2012; due every 10 years Mammogram: 05/08/2020; due every year Bone Density: 04/16/2020; due every 3 years Recommended yearly ophthalmology/optometry visit for glaucoma screening and checkup Recommended yearly dental visit for hygiene and checkup  Vaccinations: Influenza vaccine: 03/16/2020 (due Fall 2022) Pneumococcal vaccine: 11/12/2012, 11/04/2014 Tdap vaccine: 07/26/2012; due every 10 years (due 2024) Shingles vaccine: never done; Please call your insurance company to determine your out of pocket expense for the Shingrix vaccine. You may receive this vaccine at your local pharmacy.    Covid-19: Pfizer 08/10/2019, 08/31/2019, 03/23/2020, 10/19/2020  Advanced directives: Please bring a copy of your health care power of attorney and living will to the office at your convenience.  Conditions/risks identified: Yes; Reviewed health maintenance screenings with patient today and relevant education, vaccines, and/or referrals were provided. Please continue to do your personal lifestyle choices by: daily care of teeth and gums, regular physical activity (goal should be 5 days a week for 30 minutes), eat a healthy diet, avoid tobacco and drug use, limiting any alcohol intake, taking a low-dose aspirin (if not allergic or have been advised by your provider otherwise) and taking vitamins and minerals as recommended by your provider. Continue doing brain stimulating activities (puzzles, reading, adult coloring books, staying active) to keep memory sharp. Continue to eat heart healthy diet (full of fruits, vegetables, whole grains, lean protein, water--limit salt, fat, and sugar intake) and increase physical activity as  tolerated.  Next appointment: Please schedule your next Medicare Wellness Visit with your Nurse Health Advisor in 1 year by calling [phone].  Preventive Care 69 Years and Older, Female Preventive care refers to lifestyle choices and visits with your health care provider that can promote health and wellness. What does preventive care include?  A yearly physical exam. This is also called an annual well check.  Dental exams once or twice a year.  Routine eye exams. Ask your health care provider how often you should have your eyes checked.  Personal lifestyle choices, including:  Daily care of your teeth and gums.  Regular physical activity.  Eating a healthy diet.  Avoiding tobacco and drug use.  Limiting alcohol use.  Practicing safe sex.  Taking low-dose aspirin every day.  Taking vitamin and mineral supplements as recommended by your health care provider. What happens during an annual well check? The services and screenings done by your health care provider during your annual well check will depend on your age, overall health, lifestyle risk factors, and family history of disease. Counseling  Your health care provider may ask you questions about your:  Alcohol use.  Tobacco use.  Drug use.  Emotional well-being.  Home and relationship well-being.  Sexual activity.  Eating habits.  History of falls.  Memory and ability to understand (cognition).  Work and work Statistician.  Reproductive health. Screening  You may have the following tests or measurements:  Height, weight, and BMI.  Blood pressure.  Lipid and cholesterol levels. These may be checked every 5 years, or more frequently if you are over 27 years old.  Skin check.  Lung cancer screening. You may have this screening every year starting at age 76 if you have a 30-pack-year history of smoking and currently smoke or have  quit within the past 15 years.  Fecal occult blood test (FOBT) of  the stool. You may have this test every year starting at age 38.  Flexible sigmoidoscopy or colonoscopy. You may have a sigmoidoscopy every 5 years or a colonoscopy every 10 years starting at age 57.  Hepatitis C blood test.  Hepatitis B blood test.  Sexually transmitted disease (STD) testing.  Diabetes screening. This is done by checking your blood sugar (glucose) after you have not eaten for a while (fasting). You may have this done every 1-3 years.  Bone density scan. This is done to screen for osteoporosis. You may have this done starting at age 7.  Mammogram. This may be done every 1-2 years. Talk to your health care provider about how often you should have regular mammograms. Talk with your health care provider about your test results, treatment options, and if necessary, the need for more tests. Vaccines  Your health care provider may recommend certain vaccines, such as:  Influenza vaccine. This is recommended every year.  Tetanus, diphtheria, and acellular pertussis (Tdap, Td) vaccine. You may need a Td booster every 10 years.  Zoster vaccine. You may need this after age 28.  Pneumococcal 13-valent conjugate (PCV13) vaccine. One dose is recommended after age 31.  Pneumococcal polysaccharide (PPSV23) vaccine. One dose is recommended after age 2. Talk to your health care provider about which screenings and vaccines you need and how often you need them. This information is not intended to replace advice given to you by your health care provider. Make sure you discuss any questions you have with your health care provider. Document Released: 07/17/2015 Document Revised: 03/09/2016 Document Reviewed: 04/21/2015 Elsevier Interactive Patient Education  2017 [employer] Prevention in the Home Falls can cause injuries. They can happen to people of all ages. There are many things you can do to make your home safe and to help prevent falls. What can I do on the outside of my  home?  Regularly fix the edges of walkways and driveways and fix any cracks.  Remove anything that might make you trip as you walk through a door, such as a raised step or threshold.  Trim any bushes or trees on the path to your home.  Use bright outdoor lighting.  Clear any walking paths of anything that might make someone trip, such as rocks or tools.  Regularly check to see if handrails are loose or broken. Make sure that both sides of any steps have handrails.  Any raised decks and porches should have guardrails on the edges.  Have any leaves, snow, or ice cleared regularly.  Use sand or salt on walking paths during winter.  Clean up any spills in your garage right away. This includes oil or grease spills. What can I do in the bathroom?  Use night lights.  Install grab bars by the toilet and in the tub and shower. Do not use towel bars as grab bars.  Use non-skid mats or decals in the tub or shower.  If you need to sit down in the shower, use a plastic, non-slip stool.  Keep the floor dry. Clean up any water that spills on the floor as soon as it happens.  Remove soap buildup in the tub or shower regularly.  Attach bath mats securely with double-sided non-slip rug tape.  Do not have throw rugs and other things on the floor that can make you trip. What can I do in the bedroom?  Use night lights.  Make sure that you have a light by your bed that is easy to reach.  Do not use any sheets or blankets that are too big for your bed. They should not hang down onto the floor.  Have a firm chair that has side arms. You can use this for support while you get dressed.  Do not have throw rugs and other things on the floor that can make you trip. What can I do in the kitchen?  Clean up any spills right away.  Avoid walking on wet floors.  Keep items that you use a lot in easy-to-reach places.  If you need to reach something above you, use a strong step stool that has a  grab bar.  Keep electrical cords out of the way.  Do not use floor polish or wax that makes floors slippery. If you must use wax, use non-skid floor wax.  Do not have throw rugs and other things on the floor that can make you trip. What can I do with my stairs?  Do not leave any items on the stairs.  Make sure that there are handrails on both sides of the stairs and use them. Fix handrails that are broken or loose. Make sure that handrails are as long as the stairways.  Check any carpeting to make sure that it is firmly attached to the stairs. Fix any carpet that is loose or worn.  Avoid having throw rugs at the top or bottom of the stairs. If you do have throw rugs, attach them to the floor with carpet tape.  Make sure that you have a light switch at the top of the stairs and the bottom of the stairs. If you do not have them, ask someone to add them for you. What else can I do to help prevent falls?  Wear shoes that:  Do not have high heels.  Have rubber bottoms.  Are comfortable and fit you well.  Are closed at the toe. Do not wear sandals.  If you use a stepladder:  Make sure that it is fully opened. Do not climb a closed stepladder.  Make sure that both sides of the stepladder are locked into place.  Ask someone to hold it for you, if possible.  Clearly mark and make sure that you can see:  Any grab bars or handrails.  First and last steps.  Where the edge of each step is.  Use tools that help you move around (mobility aids) if they are needed. These include:  Canes.  Walkers.  Scooters.  Crutches.  Turn on the lights when you go into a dark area. Replace any light bulbs as soon as they burn out.  Set up your furniture so you have a clear path. Avoid moving your furniture around.  If any of your floors are uneven, fix them.  If there are any pets around you, be aware of where they are.  Review your medicines with your doctor. Some medicines can make  you feel dizzy. This can increase your chance of falling. Ask your doctor what other things that you can do to help prevent falls. This information is not intended to replace advice given to you by your health care provider. Make sure you discuss any questions you have with your health care provider. Document Released: 04/16/2009 Document Revised: 11/26/2015 Document Reviewed: 07/25/2014 Elsevier Interactive Patient Education  2017 [employer].

## 2021-02-11 ENCOUNTER — Other Ambulatory Visit: Payer: Self-pay | Admitting: Internal Medicine

## 2021-02-24 DIAGNOSIS — L821 Other seborrheic keratosis: Secondary | ICD-10-CM | POA: Diagnosis not present

## 2021-02-24 DIAGNOSIS — D225 Melanocytic nevi of trunk: Secondary | ICD-10-CM | POA: Diagnosis not present

## 2021-02-24 DIAGNOSIS — L814 Other melanin hyperpigmentation: Secondary | ICD-10-CM | POA: Diagnosis not present

## 2021-02-24 DIAGNOSIS — D1801 Hemangioma of skin and subcutaneous tissue: Secondary | ICD-10-CM | POA: Diagnosis not present

## 2021-03-05 ENCOUNTER — Other Ambulatory Visit: Payer: Self-pay | Admitting: Internal Medicine

## 2021-03-18 DIAGNOSIS — H1013 Acute atopic conjunctivitis, bilateral: Secondary | ICD-10-CM | POA: Diagnosis not present

## 2021-03-18 DIAGNOSIS — J4531 Mild persistent asthma with (acute) exacerbation: Secondary | ICD-10-CM | POA: Diagnosis not present

## 2021-03-25 ENCOUNTER — Other Ambulatory Visit: Payer: Self-pay | Admitting: Obstetrics & Gynecology

## 2021-03-25 DIAGNOSIS — Z1231 Encounter for screening mammogram for malignant neoplasm of breast: Secondary | ICD-10-CM

## 2021-05-11 ENCOUNTER — Encounter: Payer: Self-pay | Admitting: Internal Medicine

## 2021-05-11 ENCOUNTER — Telehealth (INDEPENDENT_AMBULATORY_CARE_PROVIDER_SITE_OTHER): Payer: Medicare PPO | Admitting: Internal Medicine

## 2021-05-11 DIAGNOSIS — U071 COVID-19: Secondary | ICD-10-CM | POA: Diagnosis not present

## 2021-05-11 DIAGNOSIS — J0100 Acute maxillary sinusitis, unspecified: Secondary | ICD-10-CM | POA: Diagnosis not present

## 2021-05-11 MED ORDER — AZITHROMYCIN 250 MG PO TABS: ORAL_TABLET | ORAL | 0 refills | Status: DC

## 2021-05-11 NOTE — Progress Notes (Signed)
Virtual Visit via Telephone Note  I connected with Pamela Rich on 07/28/20 at  9:30 AM EST by telephone and verified that I am speaking with the correct person using two identifiers.  Location:  Persons participating in the virtual visit: patient, provider Patient: home Provider: [facility]   I discussed the limitations, risks, security and privacy concerns of performing an evaluation and management service by telephone and the availability of in person appointments. I also discussed with the patient that there may be a patient responsible charge related to this service. The patient expressed understanding and agreed to proceed.  No chief complaint on file.    History of Present Illness: The patient is complaining of mild COVID symptoms.  She had tested positive for COVID on Saturday.  She is feeling better overall, however, having sinus pain and upper tooth ache.  There is sinus drainage and cough at times.  She is taking vitamin C, zinc and vitamin D.  She is on aspirin   Review of Systems  Constitutional:  Negative for chills and fever.  HENT:  Positive for congestion and sinus pain. Negative for hearing loss and sore throat.   Genitourinary:  Negative for dysuria.  Musculoskeletal:  Negative for back pain and myalgias.  Skin:  Negative for rash.    Observations/Objective: The patient sounds normal of the phone  Assessment and Plan:  Problem List Items Addressed This Visit     COVID-19    For a mild COVID-19 case - take zinc 50 mg a day for 1 week, vitamin C 1000 mg daily for 1 week, vitamin D2 50,000 units weekly for 2 months (unless  taking vitamin D daily already). Take Allegra or Benadryl.  Maintain good oral hydration and take Tylenol for high fever.  Call if problems. Isolate for 5 days, then wear a mask for 5 days per CDC. No need for an antiviral medication since she is getting better overall.       Relevant Medications   azithromycin (ZITHROMAX Z-PAK) 250 MG  tablet   Mild sinusitis    COVID triggered.  Start Z-Pak p.o.      Relevant Medications   azithromycin (ZITHROMAX Z-PAK) 250 MG tablet     Meds ordered this encounter  Medications   azithromycin (ZITHROMAX Z-PAK) 250 MG tablet    Sig: As directed    Dispense:  6 tablet    Refill:  0      Follow Up Instructions:    I discussed the assessment and treatment plan with the patient. The patient was provided an opportunity to ask questions and all were answered. The patient agreed with the plan and demonstrated an understanding of the instructions.   The patient was advised to call back or seek an in-person evaluation if the symptoms worsen or if the condition fails to improve as anticipated.  I provided 14 minutes of non-face-to-face time during this encounter.   Walker Kehr, MD

## 2021-05-11 NOTE — Assessment & Plan Note (Signed)
COVID triggered.  Start Z-Pak p.o.

## 2021-05-11 NOTE — Assessment & Plan Note (Signed)
For a mild COVID-19 case - take zinc 50 mg a day for 1 week, vitamin C 1000 mg daily for 1 week, vitamin D2 50,000 units weekly for 2 months (unless  taking vitamin D daily already). Take Allegra or Benadryl.  Maintain good oral hydration and take Tylenol for high fever.  Call if problems. Isolate for 5 days, then wear a mask for 5 days per CDC. No need for an antiviral medication since she is getting better overall.

## 2021-05-12 ENCOUNTER — Ambulatory Visit: Payer: Medicare PPO

## 2021-05-17 DIAGNOSIS — Z20822 Contact with and (suspected) exposure to covid-19: Secondary | ICD-10-CM | POA: Diagnosis not present

## 2021-05-17 DIAGNOSIS — Z03818 Encounter for observation for suspected exposure to other biological agents ruled out: Secondary | ICD-10-CM | POA: Diagnosis not present

## 2021-06-09 ENCOUNTER — Ambulatory Visit: Payer: Medicare PPO

## 2021-06-10 ENCOUNTER — Ambulatory Visit: Payer: Medicare PPO

## 2021-06-16 ENCOUNTER — Ambulatory Visit: Payer: Medicare PPO | Admitting: Internal Medicine

## 2021-07-14 ENCOUNTER — Ambulatory Visit
Admission: RE | Admit: 2021-07-14 | Discharge: 2021-07-14 | Disposition: A | Discharge status: Home / Self Care | Payer: Medicare PPO | Source: Ambulatory Visit | Attending: Obstetrics & Gynecology | Admitting: Obstetrics & Gynecology

## 2021-07-14 DIAGNOSIS — Z1231 Encounter for screening mammogram for malignant neoplasm of breast: Secondary | ICD-10-CM

## 2021-07-16 ENCOUNTER — Other Ambulatory Visit: Payer: Self-pay

## 2021-07-16 ENCOUNTER — Encounter: Payer: Self-pay | Admitting: Family

## 2021-07-16 ENCOUNTER — Telehealth: Payer: Self-pay

## 2021-07-16 ENCOUNTER — Ambulatory Visit: Payer: Self-pay

## 2021-07-16 ENCOUNTER — Ambulatory Visit (INDEPENDENT_AMBULATORY_CARE_PROVIDER_SITE_OTHER): Payer: Medicare PPO | Admitting: Family

## 2021-07-16 DIAGNOSIS — M5441 Lumbago with sciatica, right side: Secondary | ICD-10-CM

## 2021-07-16 MED ORDER — PREDNISONE 50 MG PO TABS: ORAL_TABLET | ORAL | 0 refills | Status: DC

## 2021-07-16 NOTE — Progress Notes (Signed)
Office Visit Note   Patient: Pamela Rich           Date of Birth: 1946-04-03           MRN: [id number] Visit Date: 07/16/2021              Requested by: Binnie Rail, MD [place],  [place] [id number] PCP: Binnie Rail, MD  No chief complaint on file.     HPI: The patient is a 76 year old woman seen for evaluation of 3-4 week history of right sided hip, groin and posterior thigh pain. Aching constant, made worse with lying in bed, difficutly getting comfortable at night. Cannot lie on side. Able to walk with out pain. States the RLE feels heavy.  History of lumbar radiculopathy, ESIs as well as lumbar surgery    Assessment & Plan: Visit Diagnoses:  1. Acute right-sided low back pain with right-sided sciatica     Plan: We will trial a prednisone burst.  She will follow-up with [place] in the office in 3 to 4 weeks if she is not having any improvement  Follow-Up Instructions: No follow-ups on file.   Back Exam   Tenderness  The patient is experiencing no tenderness.   Range of Motion  The patient has normal back ROM.  Muscle Strength  The patient has normal back strength.  Tests  Straight leg raise right: positive Straight leg raise left: negative     Patient is alert, oriented, no adenopathy, well-dressed, normal affect, normal respiratory effort.   Imaging: No results found. No images are attached to the encounter.  Labs: Lab Results  Component Value Date   LABORGA ESCHERICHIA COLI 02/07/2017     Lab Results  Component Value Date   ALBUMIN 4.3 08/04/2020   ALBUMIN 4.4 07/19/2019   ALBUMIN 4.2 07/11/2018    No results found for: MG Lab Results  Component Value Date   VD25OH 60.76 08/04/2020   VD25OH 47.77 05/20/2014   VD25OH 55 03/06/2012    No results found for: PREALBUMIN CBC EXTENDED Latest Ref Rng & Units 08/04/2020 07/19/2019 07/11/2018  WBC 4.0 - 10.5 K/uL 8.7 8.6 8.9  RBC 3.87 - 5.11 Mil/uL 4.37 4.46 4.51  HGB 12.0  - 15.0 g/dL 13.8 13.9 14.3  HCT 36.0 - 46.0 % 41.0 41.9 41.9  PLT 150.0 - 400.0 K/uL 345.0 347.0 384.0  NEUTROABS 1.4 - 7.7 K/uL 5.9 5.3 5.7  LYMPHSABS 0.7 - 4.0 K/uL 2.2 2.3 2.1     There is no height or weight on file to calculate BMI.  Orders:  Orders Placed This Encounter  Procedures   XR Lumbar Spine 2-3 Views   No orders of the defined types were placed in this encounter.    Procedures: No procedures performed  Clinical Data: No additional findings.  ROS:  All other systems negative, except as noted in the HPI. Review of Systems  Constitutional: Negative.   Musculoskeletal:  Positive for myalgias. Negative for back pain.  Neurological:  Negative for weakness and numbness.   Objective: Vital Signs: LMP 07/04/2000   Specialty Comments:  No specialty comments available.  PMFS History: Patient Active Problem List   Diagnosis Date Noted   COVID-19 05/11/2021   Nonintractable headache 12/09/2020   Anxiety 08/04/2020   Medial epicondylitis of elbow, left 11/22/2019   PAD (peripheral artery disease) ([place]) 04/01/2019   De Quervain's syndrome (tenosynovitis) 03/12/2018   Hearing loss, wears hearing aids 06/22/2017   Radicular  pain of left lower extremity 02/28/2017   Chronic pain of both knees 06/21/2016   Mild sinusitis 06/07/2016   Mild intermittent asthma 07/20/2015   Other allergic rhinitis 07/20/2015   Mixed hyperlipidemia 03/01/2012   Migraine without aura 04/05/2010   Vitamin D deficiency 03/19/2009   DEGENERATIVE JOINT DISEASE 12/05/2007   Hypothyroidism 01/22/2007   IBS 09/28/2006   Osteopenia 09/28/2006   Past Medical History:  Diagnosis Date   Allergy    SEASONAL   Fibroid    no surgery   Heart murmur    Hyperlipidemia    Hypothyroidism    Migraines    Mild intermittent asthma 07/20/2015   pt is currently not taking any medications   Osteopenia    Sciatic nerve lesion 2018   Vitamin D deficiency     Family History  Problem Relation  Age of Onset   Hypertension Mother    Heart disease Mother        CHF; AS   Osteoporosis Mother    Heart attack Mother 46   Allergic rhinitis Mother    Diabetes Father    Stroke Father        onset in 55s   Cancer Brother        malignant brain tumor   Colon cancer Neg Hx    Esophageal cancer Neg Hx    Rectal cancer Neg Hx    Stomach cancer Neg Hx    Angioedema Neg Hx    Asthma Neg Hx    Eczema Neg Hx    Immunodeficiency Neg Hx    Urticaria Neg Hx    Breast cancer Neg Hx     Past Surgical History:  Procedure Laterality Date   DENTAL SURGERY     DORSAL COMPARTMENT RELEASE Right 03/20/2018   Procedure: RIGHT OPEN  RELEASE DORSAL COMPARTMENT (DEQUERVAINS);  Surgeon: Daryll Brod, MD;  Location: [facility];  Service: Orthopedics;  Laterality: Right;   HYSTEROSCOPY WITH D & C     LUMBAR FUSION     Dr Hal Neer   SEPTOPLASTY     SINOSCOPY     TUBAL LIGATION     WRIST ARTHROSCOPY WITH FOVEAL TRIANGULAR FIBROCARTILAGE COMPLEX REPAIR Right 03/20/2018   Procedure: RIGHT WRIST ARTHROSCOPY WITH DEBRIDEMENT, SCAPHOLUNATE/TRIANGULAR FIBROCARTILAGE COMPLEX REPAIR;  Surgeon: Daryll Brod, MD;  Location: [facility];  Service: Orthopedics;  Laterality: Right;   Social History   Occupational History   Occupation: retired  Tobacco Use   Smoking status: Former    Packs/day: 0.30    Years: 40.00    Pack years: 12.00    Types: Cigarettes    Quit date: 07/04/1974    Years since quitting: 47.0   Smokeless tobacco: Never   Tobacco comments:    smoked 1965-1976 , < 3 cig/ day  Vaping Use   Vaping Use: Never used  Substance and Sexual Activity   Alcohol use: No    Alcohol/week: 0.0 standard drinks    Comment: rarely   Drug use: No   Sexual activity: Not Currently    Partners: Male    Birth control/protection: Surgical, Post-menopausal    Comment: BTL

## 2021-07-16 NOTE — Telephone Encounter (Signed)
Pt called and stated that erin was going to call in an rx for her and the pharmacy doesn't have anything. Please cb pt and advise. Thank you

## 2021-07-16 NOTE — Telephone Encounter (Signed)
Called and sw Erin rx sent for prednisone 50 mg I po qd x 5 days. This was sent to pharm and called pt to advise.

## 2021-07-26 ENCOUNTER — Telehealth: Payer: Self-pay | Admitting: Family

## 2021-07-26 DIAGNOSIS — M5441 Lumbago with sciatica, right side: Secondary | ICD-10-CM

## 2021-07-26 NOTE — Telephone Encounter (Signed)
Pt states medication that was called in from previous visit helped but states lots of weakness in that leg still and wanted to know what the next steps would be as she is still not 100%. The best call back number is [phone]

## 2021-07-26 NOTE — Telephone Encounter (Signed)
You saw this pt in the office for LBP 07/16/2021 and treated with prednisone. Please see message below and advise.

## 2021-07-27 ENCOUNTER — Telehealth: Payer: Self-pay

## 2021-07-27 DIAGNOSIS — F411 Generalized anxiety disorder: Secondary | ICD-10-CM

## 2021-07-27 NOTE — Telephone Encounter (Signed)
Pt has requested Rx for appt on 08/12/21, [facility]

## 2021-07-28 ENCOUNTER — Telehealth: Payer: Self-pay | Admitting: Physical Medicine and Rehabilitation

## 2021-07-28 MED ORDER — CYCLOBENZAPRINE HCL 10 MG PO TABS: 10.0000 mg | ORAL_TABLET | Freq: Three times a day (TID) | ORAL | 0 refills | Status: DC | PRN

## 2021-07-28 MED ORDER — MELOXICAM 15 MG PO TABS: 15.0000 mg | ORAL_TABLET | Freq: Every day | ORAL | 0 refills | Status: DC

## 2021-07-28 NOTE — Addendum Note (Signed)
Addended by: Raymondo Band on: 07/28/2021 02:16 PM   Modules accepted: Orders

## 2021-07-28 NOTE — Telephone Encounter (Signed)
Pt called. Having lots of pain in her back and side. She is wondering what she could do in the meantime until her appt because tylenol is not working.  Cb [phone]

## 2021-07-30 MED ORDER — DIAZEPAM 5 MG PO TABS: ORAL_TABLET | ORAL | 0 refills | Status: DC

## 2021-07-30 NOTE — Telephone Encounter (Signed)
Pre-procedure diazepam ordered for pre-operative anxiety.  

## 2021-07-30 NOTE — Addendum Note (Signed)
Addended by: Raymondo Band on: 07/30/2021 10:02 AM   Modules accepted: Orders

## 2021-08-03 ENCOUNTER — Other Ambulatory Visit: Payer: Self-pay

## 2021-08-05 ENCOUNTER — Other Ambulatory Visit: Payer: Self-pay

## 2021-08-05 NOTE — Telephone Encounter (Signed)
Refill denial for azel[facility]ine[facility]fluticasone at [facility]. Pt needs an OV.

## 2021-08-09 ENCOUNTER — Encounter: Payer: Self-pay | Admitting: Internal Medicine

## 2021-08-09 NOTE — Patient Instructions (Addendum)
Blood work was ordered.     Medications changes include :  pepcid 40 mg daily for reflux   Your prescription(s) have been submitted to your pharmacy. Please take as directed and contact our office if you believe you are having problem(s) with the medication(s).  Please followup in 1 year   Health Maintenance, Female Adopting a healthy lifestyle and getting preventive care are important in promoting health and wellness. Ask your health care provider about: The right schedule for you to have regular tests and exams. Things you can do on your own to prevent diseases and keep yourself healthy. What should I know about diet, weight, and exercise? Eat a healthy diet  Eat a diet that includes plenty of vegetables, fruits, low-fat dairy products, and lean protein. Do not eat a lot of foods that are high in solid fats, added sugars, or sodium. Maintain a healthy weight Body mass index (BMI) is used to identify weight problems. It estimates body fat based on height and weight. Your health care provider can help determine your BMI and help you achieve or maintain a healthy weight. Get regular exercise Get regular exercise. This is one of the most important things you can do for your health. Most adults should: Exercise for at least 150 minutes each week. The exercise should increase your heart rate and make you sweat (moderate-intensity exercise). Do strengthening exercises at least twice a week. This is in addition to the moderate-intensity exercise. Spend less time sitting. Even light physical activity can be beneficial. Watch cholesterol and blood lipids Have your blood tested for lipids and cholesterol at 76 years of age, then have this test every 5 years. Have your cholesterol levels checked more often if: Your lipid or cholesterol levels are high. You are older than 76 years of age. You are at high risk for heart disease. What should I know about cancer screening? Depending on your  health history and family history, you may need to have cancer screening at various ages. This may include screening for: Breast cancer. Cervical cancer. Colorectal cancer. Skin cancer. Lung cancer. What should I know about heart disease, diabetes, and high blood pressure? Blood pressure and heart disease High blood pressure causes heart disease and increases the risk of stroke. This is more likely to develop in people who have high blood pressure readings or are overweight. Have your blood pressure checked: Every 3-5 years if you are 75-66 years of age. Every year if you are 82 years old or older. Diabetes Have regular diabetes screenings. This checks your fasting blood sugar level. Have the screening done: Once every three years after age 58 if you are at a normal weight and have a low risk for diabetes. More often and at a younger age if you are overweight or have a high risk for diabetes. What should I know about preventing infection? Hepatitis B If you have a higher risk for hepatitis B, you should be screened for this virus. Talk with your health care provider to find out if you are at risk for hepatitis B infection. Hepatitis C Testing is recommended for: Everyone born from [birth year] through 1965. Anyone with known risk factors for hepatitis C. Sexually transmitted infections (STIs) Get screened for STIs, including gonorrhea and chlamydia, if: You are sexually active and are younger than 76 years of age. You are older than 76 years of age and your health care provider tells you that you are at risk for this type of infection. Your sexual  activity has changed since you were last screened, and you are at increased risk for chlamydia or gonorrhea. Ask your health care provider if you are at risk. Ask your health care provider about whether you are at high risk for HIV. Your health care provider may recommend a prescription medicine to help prevent HIV infection. If you choose to take  medicine to prevent HIV, you should first get tested for HIV. You should then be tested every 3 months for as long as you are taking the medicine. Pregnancy If you are about to stop having your period (premenopausal) and you may become pregnant, seek counseling before you get pregnant. Take 400 to 800 micrograms (mcg) of folic acid every day if you become pregnant. Ask for birth control (contraception) if you want to prevent pregnancy. Osteoporosis and menopause Osteoporosis is a disease in which the bones lose minerals and strength with aging. This can result in bone fractures. If you are 59 years old or older, or if you are at risk for osteoporosis and fractures, ask your health care provider if you should: Be screened for bone loss. Take a calcium or vitamin D supplement to lower your risk of fractures. Be given hormone replacement therapy (HRT) to treat symptoms of menopause. Follow these instructions at home: Alcohol use Do not drink alcohol if: Your health care provider tells you not to drink. You are pregnant, may be pregnant, or are planning to become pregnant. If you drink alcohol: Limit how much you have to: 0-1 drink a day. Know how much alcohol is in your drink. In the [place] one drink equals one 12 oz bottle of beer (355 mL), one 5 oz glass of wine (148 mL), or one 1 oz glass of hard liquor (44 mL). Lifestyle Do not use any products that contain nicotine or tobacco. These products include cigarettes, chewing tobacco, and vaping devices, such as e-cigarettes. If you need help quitting, ask your health care provider. Do not use street drugs. Do not share needles. Ask your health care provider for help if you need support or information about quitting drugs. General instructions Schedule regular health, dental, and eye exams. Stay current with your vaccines. Tell your health care provider if: You often feel depressed. You have ever been abused or do not feel safe at  home. Summary Adopting a healthy lifestyle and getting preventive care are important in promoting health and wellness. Follow your health care provider's instructions about healthy diet, exercising, and getting tested or screened for diseases. Follow your health care provider's instructions on monitoring your cholesterol and blood pressure. This information is not intended to replace advice given to you by your health care provider. Make sure you discuss any questions you have with your health care provider. Document Revised: 11/09/2020 Document Reviewed: 11/09/2020 Elsevier Patient Education  [place].

## 2021-08-09 NOTE — Progress Notes (Signed)
Subjective:    Patient ID: Pamela Rich, female    DOB: 1946/04/08, 76 y.o.   MRN: [id number]   This visit occurred during the SARS-CoV-2 public health emergency.  Safety protocols were in place, including screening questions prior to the visit, additional usage of staff PPE, and extensive cleaning of exam room while observing appropriate contact time as indicated for disinfecting solutions.    HPI She is here for a physical exam.   Getting an epidural in two days for right sided radiculopathy at L3-4.     Medications and allergies reviewed with patient and updated if appropriate.  Patient Active Problem List   Diagnosis Date Noted   COVID-19 05/11/2021   Anxiety 08/04/2020   Medial epicondylitis of elbow, left 11/22/2019   PAD (peripheral artery disease) ([place]) 04/01/2019   De Quervain's syndrome (tenosynovitis) 03/12/2018   Hearing loss, wears hearing aids 06/22/2017   Lumbar radiculopathy 02/28/2017   Chronic pain of both knees 06/21/2016   Mild intermittent asthma 07/20/2015   Other allergic rhinitis 07/20/2015   Mixed hyperlipidemia 03/01/2012   Migraine without aura 04/05/2010   Vitamin D deficiency 03/19/2009   GERD (gastroesophageal reflux disease) 07/08/2008   DEGENERATIVE JOINT DISEASE 12/05/2007   Hypothyroidism 01/22/2007   IBS 09/28/2006   Osteopenia 09/28/2006    Current Outpatient Medications on File Prior to Visit  Medication Sig Dispense Refill   albuterol (PROAIR HFA) 108 (90 Base) MCG/ACT inhaler INHALE 2 PUFFS EVERY 4 HOURS AS NEEDED FOR WHEEZING OR COUGHING SPELLS 18 g 2   Ascorbic Acid (VITAMIN C) 1000 MG tablet Take 1,000 mg by mouth daily.     aspirin 81 MG tablet Take 81 mg by mouth daily.     azelastine (ASTELIN) 0.1 % nasal spray Use in each nostril as directed 90 mL 1   Azelastine-Fluticasone 137-50 MCG/ACT SUSP Place into both nostrils.     Cholecalciferol (VITAMIN D3) 1000 UNITS CAPS Take by mouth daily.     cyclobenzaprine  (FLEXERIL) 10 MG tablet Take 1 tablet (10 mg total) by mouth 3 (three) times daily as needed for muscle spasms. 30 tablet 0   diazepam (VALIUM) 5 MG tablet Take 1 by mouth 1 hour  pre-procedure with very light food. May bring 2nd tablet to appointment. 2 tablet 0   fluticasone (FLONASE) 50 MCG/ACT nasal spray SPRAY 2 SPRAYS INTO EACH NOSTRIL EVERY DAY 48 mL 1   fluticasone (FLOVENT HFA) 44 MCG/ACT inhaler Inhale 2 puffs into the lungs 2 (two) times daily. 32.3 g 5   folic acid (FOLVITE) 1 MG tablet Take 1 mg by mouth daily.     meloxicam (MOBIC) 15 MG tablet Take 1 tablet (15 mg total) by mouth daily. Take with food 30 tablet 0   Multiple Vitamin (MULTIVITAMIN) tablet Take by mouth.     pravastatin (PRAVACHOL) 20 MG tablet TAKE 1 TABLET BY MOUTH EVERY DAY 90 tablet 1   SYNTHROID 112 MCG tablet Take by mouth.     triamcinolone cream (KENALOG) 0.1 % SMARTSIG:1 sparingly Topical Twice Daily     levothyroxine (SYNTHROID) 50 MCG tablet TAKE 2 TABLETS BY MOUTH DAILY. (Patient not taking: Reported on 08/10/2021) 180 tablet 1   No current facility-administered medications on file prior to visit.    Past Medical History:  Diagnosis Date   Allergy    SEASONAL   Fibroid    no surgery   Heart murmur    Hyperlipidemia    Hypothyroidism    Migraines  Mild intermittent asthma 07/20/2015   pt is currently not taking any medications   Osteopenia    Sciatic nerve lesion 2018   Vitamin D deficiency     Past Surgical History:  Procedure Laterality Date   DENTAL SURGERY     DORSAL COMPARTMENT RELEASE Right 03/20/2018   Procedure: RIGHT OPEN  RELEASE DORSAL COMPARTMENT (DEQUERVAINS);  Surgeon: Daryll Brod, MD;  Location: [facility];  Service: Orthopedics;  Laterality: Right;   HYSTEROSCOPY WITH D & C     LUMBAR FUSION     Dr Hal Neer   SEPTOPLASTY     SINOSCOPY     TUBAL LIGATION     WRIST ARTHROSCOPY WITH FOVEAL TRIANGULAR FIBROCARTILAGE COMPLEX REPAIR Right 03/20/2018   Procedure:  RIGHT WRIST ARTHROSCOPY WITH DEBRIDEMENT, SCAPHOLUNATE/TRIANGULAR FIBROCARTILAGE COMPLEX REPAIR;  Surgeon: Daryll Brod, MD;  Location: [facility];  Service: Orthopedics;  Laterality: Right;    Social History   Socioeconomic History   Marital status: Widowed    Spouse name: Not on file   Number of children: 3   Years of education: Not on file   Highest education level: Not on file  Occupational History   Occupation: retired  Tobacco Use   Smoking status: Former    Packs/day: 0.30    Years: 40.00    Pack years: 12.00    Types: Cigarettes    Quit date: 07/04/1974    Years since quitting: 47.1   Smokeless tobacco: Never   Tobacco comments:    smoked 1965-1976 , < 3 cig/ day  Vaping Use   Vaping Use: Never used  Substance and Sexual Activity   Alcohol use: No    Alcohol/week: 0.0 standard drinks    Comment: rarely   Drug use: No   Sexual activity: Not Currently    Partners: Male    Birth control/protection: Surgical, Post-menopausal    Comment: BTL  Other Topics Concern   Not on file  Social History Narrative   Exercise: walking, stretching, weights   Social Determinants of Health   Financial Resource Strain: Low Risk    Difficulty of Paying Living Expenses: Not hard at all  Food Insecurity: No Food Insecurity   Worried About Charity fundraiser in the Last Year: Never true   [place] in the Last Year: Never true  Transportation Needs: No Transportation Needs   Lack of Transportation (Medical): No   Lack of Transportation (Non-Medical): No  Physical Activity: Sufficiently Active   Days of Exercise per Week: 7 days   Minutes of Exercise per Session: 60 min  Stress: No Stress Concern Present   Feeling of Stress : Not at all  Social Connections: Moderately Integrated   Frequency of Communication with Friends and Family: More than three times a week   Frequency of Social Gatherings with Friends and Family: More than three times a week   Attends  Religious Services: More than 4 times per year   Active Member of [employer] or Organizations: Yes   Attends Archivist Meetings: More than 4 times per year   Marital Status: Widowed    Family History  Problem Relation Age of Onset   Hypertension Mother    Heart disease Mother        CHF; AS   Osteoporosis Mother    Heart attack Mother 26   Allergic rhinitis Mother    Diabetes Father    Stroke Father        onset  in 62s   Cancer Brother        malignant brain tumor   Colon cancer Neg Hx    Esophageal cancer Neg Hx    Rectal cancer Neg Hx    Stomach cancer Neg Hx    Angioedema Neg Hx    Asthma Neg Hx    Eczema Neg Hx    Immunodeficiency Neg Hx    Urticaria Neg Hx    Breast cancer Neg Hx     Review of Systems  Constitutional:  Negative for chills and fever.  Eyes:  Negative for visual disturbance.  Respiratory:  Negative for cough, shortness of breath and wheezing.   Cardiovascular:  Positive for leg swelling (rare). Negative for chest pain and palpitations.  Gastrointestinal:  Negative for abdominal pain, blood in stool, constipation, diarrhea and nausea.       Gerd 1-2 a day  Genitourinary:  Negative for dysuria.  Musculoskeletal:  Positive for arthralgias.       Right leg pain - radiculopathy  Skin:  Positive for rash.  Neurological:  Positive for light-headedness. Negative for headaches.  Psychiatric/Behavioral:  Negative for dysphoric mood. The patient is not nervous/anxious.       Objective:   Vitals:   08/10/21 1258  BP: 124/76  Pulse: 87  Temp: 98.2 F (36.8 C)  SpO2: 99%   Filed Weights   08/10/21 1258  Weight: 156 lb 3.2 oz (70.9 kg)   Body mass index is 27.67 kg/m.  BP Readings from Last 3 Encounters:  08/10/21 124/76  12/09/20 122/80  12/09/20 122/80    Wt Readings from Last 3 Encounters:  08/10/21 156 lb 3.2 oz (70.9 kg)  12/09/20 154 lb 12.8 oz (70.2 kg)  12/09/20 154 lb 12.8 oz (70.2 kg)    Depression screen [facility] 2/9 12/09/2020  10/14/2019 08/30/2018 07/11/2018 06/22/2017  Decreased Interest 0 0 0 0 0  Down, Depressed, Hopeless 0 0 0 0 0  PHQ - 2 Score 0 0 0 0 0  Some recent data might be hidden     No flowsheet data found.     Physical Exam Constitutional: She appears well-developed and well-nourished. No distress.  HENT:  Head: Normocephalic and atraumatic.  Right Ear: External ear normal. Normal ear canal and TM Left Ear: External ear normal.  Normal ear canal and TM Mouth/Throat: Oropharynx is clear and moist.  Eyes: Conjunctivae and EOM are normal.  Neck: Neck supple. No tracheal deviation present. No thyromegaly present.  No carotid bruit  Cardiovascular: Normal rate, regular rhythm and normal heart sounds.   No murmur heard.  No edema. Pulmonary/Chest: Effort normal and breath sounds normal. No respiratory distress. She has no wheezes. She has no rales.  Breast: deferred   Abdominal: Soft. She exhibits no distension. There is no tenderness.  Lymphadenopathy: She has no cervical adenopathy.  Skin: Skin is warm and dry. She is not diaphoretic.  Psychiatric: She has a normal mood and affect. Her behavior is normal.     Lab Results  Component Value Date   WBC 8.7 08/04/2020   HGB 13.8 08/04/2020   HCT 41.0 08/04/2020   PLT 345.0 08/04/2020   GLUCOSE 86 08/04/2020   CHOL 185 08/04/2020   TRIG 117.0 08/04/2020   HDL 69.30 08/04/2020   LDLDIRECT 151.9 12/05/2007   LDLCALC 92 08/04/2020   ALT 11 08/04/2020   AST 21 08/04/2020   NA 138 08/04/2020   K 3.6 08/04/2020   CL 101 08/04/2020   CREATININE  0.81 08/04/2020   BUN 16 08/04/2020   CO2 32 08/04/2020   TSH 1.56 08/04/2020   MICROALBUR 0.2 01/22/2007         Assessment & Plan:   Physical exam: Screening blood work  ordered Exercise  not now due to leg pain Weight  ok for age Substance abuse  none   Reviewed recommended immunizations.   Health Maintenance  Topic Date Due   Zoster Vaccines- Shingrix (1 of 2) 11/07/2021  (Originally 12/04/1995)   TETANUS/TDAP  07/26/2022   COLONOSCOPY (Pts 45-69yrs Insurance coverage will need to be confirmed)  12/26/2022   DEXA SCAN  04/17/2023   Pneumonia Vaccine 64+ Years old  Completed   INFLUENZA VACCINE  Completed   COVID-19 Vaccine  Completed   Hepatitis C Screening  Completed   HPV VACCINES  Aged Out          See Problem List for Assessment and Plan of chronic medical problems.

## 2021-08-10 ENCOUNTER — Ambulatory Visit (INDEPENDENT_AMBULATORY_CARE_PROVIDER_SITE_OTHER): Payer: Medicare PPO | Admitting: Internal Medicine

## 2021-08-10 ENCOUNTER — Other Ambulatory Visit: Payer: Self-pay

## 2021-08-10 VITALS — BP 124/76 | HR 87 | Temp 98.2°F | Ht 63.0 in | Wt 156.2 lb

## 2021-08-10 DIAGNOSIS — Z0000 Encounter for general adult medical examination without abnormal findings: Secondary | ICD-10-CM | POA: Diagnosis not present

## 2021-08-10 DIAGNOSIS — J4520 Mild intermittent asthma, uncomplicated: Secondary | ICD-10-CM

## 2021-08-10 DIAGNOSIS — M5416 Radiculopathy, lumbar region: Secondary | ICD-10-CM | POA: Diagnosis not present

## 2021-08-10 DIAGNOSIS — M85852 Other specified disorders of bone density and structure, left thigh: Secondary | ICD-10-CM

## 2021-08-10 DIAGNOSIS — E039 Hypothyroidism, unspecified: Secondary | ICD-10-CM | POA: Diagnosis not present

## 2021-08-10 DIAGNOSIS — E782 Mixed hyperlipidemia: Secondary | ICD-10-CM

## 2021-08-10 DIAGNOSIS — K219 Gastro-esophageal reflux disease without esophagitis: Secondary | ICD-10-CM | POA: Diagnosis not present

## 2021-08-10 LAB — COMPREHENSIVE METABOLIC PANEL
ALT: 95 U/L — ABNORMAL HIGH (ref 0–35)
AST: 222 U/L — ABNORMAL HIGH (ref 0–37)
Albumin: 4.1 g/dL (ref 3.5–5.2)
Alkaline Phosphatase: 187 U/L — ABNORMAL HIGH (ref 39–117)
BUN: 14 mg/dL (ref 6–23)
CO2: 33 mEq/L — ABNORMAL HIGH (ref 19–32)
Calcium: 9.7 mg/dL (ref 8.4–10.5)
Chloride: 99 mEq/L (ref 96–112)
Creatinine, Ser: 0.99 mg/dL (ref 0.40–1.20)
GFR: 55.71 mL/min — ABNORMAL LOW (ref >60.00)
Glucose, Bld: 116 mg/dL — ABNORMAL HIGH (ref 70–99)
Potassium: 3.8 mEq/L (ref 3.5–5.1)
Sodium: 137 mEq/L (ref 135–145)
Total Bilirubin: 0.4 mg/dL (ref 0.2–1.2)
Total Protein: 7.4 g/dL (ref 6.0–8.3)

## 2021-08-10 LAB — CBC WITH DIFFERENTIAL/PLATELET
Basophils Absolute: 0.1 10*3/uL (ref 0.0–0.1)
Basophils Relative: 1 % (ref 0.0–3.0)
Eosinophils Absolute: 0.3 10*3/uL (ref 0.0–0.7)
Eosinophils Relative: 4.3 % (ref 0.0–5.0)
HCT: 38.5 % (ref 36.0–46.0)
Hemoglobin: 12.7 g/dL (ref 12.0–15.0)
Lymphocytes Relative: 15.9 % (ref 12.0–46.0)
Lymphs Abs: 1 10*3/uL (ref 0.7–4.0)
MCHC: 33.1 g/dL (ref 30.0–36.0)
MCV: 93.7 fl (ref 78.0–100.0)
Monocytes Absolute: 0.6 10*3/uL (ref 0.1–1.0)
Monocytes Relative: 8.9 % (ref 3.0–12.0)
Neutro Abs: 4.3 10*3/uL (ref 1.4–7.7)
Neutrophils Relative %: 69.9 % (ref 43.0–77.0)
Platelets: 314 10*3/uL (ref 150.0–400.0)
RBC: 4.1 Mil/uL (ref 3.87–5.11)
RDW: 13 % (ref 11.5–15.5)
WBC: 6.2 10*3/uL (ref 4.0–10.5)

## 2021-08-10 LAB — TSH: TSH: 4.04 u[IU]/mL (ref 0.35–5.50)

## 2021-08-10 LAB — LIPID PANEL
Cholesterol: 168 mg/dL (ref 0–200)
HDL: 66.3 mg/dL (ref >39.00)
LDL Cholesterol: 88 mg/dL (ref 0–99)
NonHDL: 101.97
Total CHOL/HDL Ratio: 3
Triglycerides: 69 mg/dL (ref 0.0–149.0)
VLDL: 13.8 mg/dL (ref 0.0–40.0)

## 2021-08-10 MED ORDER — FAMOTIDINE 40 MG PO TABS: 40.0000 mg | ORAL_TABLET | Freq: Every day | ORAL | 1 refills | Status: DC

## 2021-08-10 NOTE — Assessment & Plan Note (Signed)
Chronic Regular exercise and healthy diet encouraged Check lipid panel  Continue pravastatin 20 mg daily 

## 2021-08-10 NOTE — Assessment & Plan Note (Signed)
Chronic DEXA up-to-date Continue calcium and vitamin D

## 2021-08-10 NOTE — Assessment & Plan Note (Signed)
Chronic management per allergist Using inhalers as needed

## 2021-08-10 NOTE — Assessment & Plan Note (Addendum)
Right leg To have epidural in two days On Flexeril, meloxicam

## 2021-08-10 NOTE — Assessment & Plan Note (Signed)
Chronic Has a history of GERD, but has not had it consistently for a long time States GERD symptoms 1-2 times a day for a while Taking meloxicam daily, but has only been on it for 1 week, so symptoms started prior to that-advised to take with a large meal at night with food start famotidine 40 mg daily-discussed concerns with uncontrolled GERD Revise diet Advised that if her heartburn becomes controlled she may be able to decrease to the OTC 20 mg dose and eventually get off the medication She will let me know if she has any questions or concerns

## 2021-08-10 NOTE — Assessment & Plan Note (Signed)
Chronic  Clinically euthyroid Currently taking levothyroxine 100 mcg daily Check tsh  Titrate med dose if needed  

## 2021-08-11 ENCOUNTER — Other Ambulatory Visit: Payer: Self-pay | Admitting: Internal Medicine

## 2021-08-11 ENCOUNTER — Telehealth: Payer: Self-pay | Admitting: Internal Medicine

## 2021-08-11 DIAGNOSIS — R7989 Other specified abnormal findings of blood chemistry: Secondary | ICD-10-CM

## 2021-08-11 NOTE — Telephone Encounter (Signed)
Call her with her results-cholesterol is good.  Sugar was elevated.  Thyroid function and blood counts are normal.  Kidney function is slightly decreased and liver tests are elevated.   I have ordered an ultrasound of her abdomen.  I would like her to come back and have repeat liver tests as soon as she is able to-ordered.   So I do not think she had any concerning symptoms, except for some heartburn-has anything new developed?  If the meloxicam is not helping too much I would stop that for now.  If she is taking any Tylenol she should stop that for now.

## 2021-08-11 NOTE — Telephone Encounter (Signed)
Spoke with patient today and info given.  Lab ordered for this coming Monday as she has epidural tomorrow.

## 2021-08-12 ENCOUNTER — Ambulatory Visit: Payer: Self-pay

## 2021-08-12 ENCOUNTER — Ambulatory Visit (INDEPENDENT_AMBULATORY_CARE_PROVIDER_SITE_OTHER): Payer: Medicare PPO | Admitting: Physical Medicine and Rehabilitation

## 2021-08-12 ENCOUNTER — Other Ambulatory Visit: Payer: Self-pay

## 2021-08-12 ENCOUNTER — Encounter: Payer: Self-pay | Admitting: Physical Medicine and Rehabilitation

## 2021-08-12 VITALS — BP 134/86 | HR 74

## 2021-08-12 DIAGNOSIS — M5416 Radiculopathy, lumbar region: Secondary | ICD-10-CM | POA: Diagnosis not present

## 2021-08-12 MED ORDER — METHYLPREDNISOLONE ACETATE 80 MG/ML IJ SUSP
80.0000 mg | Freq: Once | INTRAMUSCULAR | Status: AC
  Administered 2021-08-12: 80 mg

## 2021-08-12 NOTE — Progress Notes (Signed)
Pt state lower back pain that travels to her right groin, leg and foot. Pt state any movement, getting in a car and laying down makes the pain worse. Pt state she takes pain meds to help ease her pain.  Numeric Pain Rating Scale and Functional Assessment Average Pain 2   In the last MONTH (on 0-10 scale) has pain interfered with the following?  1. General activity like being  able to carry out your everyday physical activities such as walking, climbing stairs, carrying groceries, or moving a chair?  Rating(8)   +Driver, -BT, -Dye Allergies.

## 2021-08-12 NOTE — Patient Instructions (Signed)

## 2021-08-16 ENCOUNTER — Other Ambulatory Visit: Payer: Medicare PPO

## 2021-08-17 ENCOUNTER — Ambulatory Visit
Admission: RE | Admit: 2021-08-17 | Discharge: 2021-08-17 | Disposition: A | Discharge status: Home / Self Care | Payer: Medicare PPO | Source: Ambulatory Visit | Attending: Internal Medicine | Admitting: Internal Medicine

## 2021-08-17 ENCOUNTER — Telehealth: Payer: Self-pay | Admitting: Internal Medicine

## 2021-08-17 ENCOUNTER — Telehealth: Payer: Self-pay

## 2021-08-17 DIAGNOSIS — N2889 Other specified disorders of kidney and ureter: Secondary | ICD-10-CM

## 2021-08-17 DIAGNOSIS — R7989 Other specified abnormal findings of blood chemistry: Secondary | ICD-10-CM | POA: Diagnosis not present

## 2021-08-17 NOTE — Procedures (Signed)
Lumbosacral Transforaminal Epidural Steroid Injection - Sub-Pedicular Approach with Fluoroscopic Guidance  Patient: Pamela Rich      Date of Birth: Aug 27, 1945 MRN: [id number] PCP: Binnie Rail, MD      Visit Date: 08/12/2021   Universal Protocol:    Date/Time: 08/12/2021  Consent Given By: the patient  Position: PRONE  Additional Comments: Vital signs were monitored before and after the procedure. Patient was prepped and draped in the usual sterile fashion. The correct patient, procedure, and site was verified.   Injection Procedure Details:   Procedure diagnoses: Lumbar radiculopathy [M54.16]    Meds Administered:  Meds ordered this encounter  Medications   methylPREDNISolone acetate (DEPO-MEDROL) injection 80 mg    Laterality: Right  Location/Site: L4  Needle:5.0 in., 22 ga.  Short bevel or Quincke spinal needle  Needle Placement: Transforaminal  Findings:    -Comments: Excellent flow of contrast along the nerve, nerve root and into the epidural space.  Procedure Details: After squaring off the end-plates to get a true AP view, the C-arm was positioned so that an oblique view of the foramen as noted above was visualized. The target area is just inferior to the "nose of the scotty dog" or sub pedicular. The soft tissues overlying this structure were infiltrated with 2-3 ml. of 1% Lidocaine without Epinephrine.  The spinal needle was inserted toward the target using a "trajectory" view along the fluoroscope beam.  Under AP and lateral visualization, the needle was advanced so it did not puncture dura and was located close the 6 O'Clock position of the pedical in AP tracterory. Biplanar projections were used to confirm position. Aspiration was confirmed to be negative for CSF and/or blood. A 1-2 ml. volume of Isovue-250 was injected and flow of contrast was noted at each level. Radiographs were obtained for documentation purposes.   After attaining the desired flow  of contrast documented above, a 0.5 to 1.0 ml test dose of 0.25% Marcaine was injected into each respective transforaminal space.  The patient was observed for 90 seconds post injection.  After no sensory deficits were reported, and normal lower extremity motor function was noted,   the above injectate was administered so that equal amounts of the injectate were placed at each foramen (level) into the transforaminal epidural space.   Additional Comments:  The patient tolerated the procedure well Dressing: 2 x 2 sterile gauze and Band-Aid    Post-procedure details: Patient was observed during the procedure. Post-procedure instructions were reviewed.  Patient left the clinic in stable condition.

## 2021-08-17 NOTE — Telephone Encounter (Signed)
Message left for patient to return call to clinic to go over Ultrasound results.  Also sent her a my-chart message as well with Dr. Quay Burow recommendations.

## 2021-08-17 NOTE — Telephone Encounter (Signed)
Please call her.  She has already reviewed the ultrasound.  It does appear that she has a mass on her left kidney that needs to be evaluated further.  I have ordered an MRI and a referral to urology for further evaluation.   Her liver does appear normal.

## 2021-08-17 NOTE — Progress Notes (Signed)
Pamela Rich - 76 y.o. female MRN [id number]  Date of birth: 11-05-1945  Office Visit Note: Visit Date: 08/12/2021 PCP: Binnie Rail, MD Referred by: Binnie Rail, MD  Subjective: Chief Complaint  Patient presents with   Lower Back - Pain   Right Leg - Pain   HPI:  Pamela Rich is a 76 y.o. female who comes in today at the request of Dr. Meridee Score and Dondra Prader, [place] for planned Right L4-5 Lumbar Transforaminal epidural steroid injection with fluoroscopic guidance.  The patient has failed conservative care including home exercise, medications, time and activity modification.  This injection will be diagnostic and hopefully therapeutic.  Please see requesting physician notes for further details and justification.  While she does get some referral past and the most of all of her symptoms seem to correspond to possible hip pathology.  We will have her follow-up in 2 weeks with [place] for possible diagnostic hip injection depending on how the epidural injection does.  She will continue to follow with Dr. Sharol Given and Dondra Prader FNP.  ROS Otherwise per HPI.  Assessment & Plan: Visit Diagnoses:    ICD-10-CM   1. Lumbar radiculopathy  M54.16 XR C-ARM NO REPORT    Epidural Steroid injection    methylPREDNISolone acetate (DEPO-MEDROL) injection 80 mg      Plan: No additional findings.   Meds & Orders:  Meds ordered this encounter  Medications   methylPREDNISolone acetate (DEPO-MEDROL) injection 80 mg    Orders Placed This Encounter  Procedures   XR C-ARM NO REPORT   Epidural Steroid injection    Follow-up: Return in about 2 weeks (around 08/26/2021) for Consider diagnostic hip injection at the next appointment.   Procedures: No procedures performed  Lumbosacral Transforaminal Epidural Steroid Injection - Sub-Pedicular Approach with Fluoroscopic Guidance  Patient: Pamela Rich      Date of Birth: [birth year] MRN: [id number] PCP: Binnie Rail, MD      Visit Date:  08/12/2021   Universal Protocol:    Date/Time: 08/12/2021  Consent Given By: the patient  Position: PRONE  Additional Comments: Vital signs were monitored before and after the procedure. Patient was prepped and draped in the usual sterile fashion. The correct patient, procedure, and site was verified.   Injection Procedure Details:   Procedure diagnoses: Lumbar radiculopathy [M54.16]    Meds Administered:  Meds ordered this encounter  Medications   methylPREDNISolone acetate (DEPO-MEDROL) injection 80 mg    Laterality: Right  Location/Site: L4  Needle:5.0 in., 22 ga.  Short bevel or Quincke spinal needle  Needle Placement: Transforaminal  Findings:    -Comments: Excellent flow of contrast along the nerve, nerve root and into the epidural space.  Procedure Details: After squaring off the end-plates to get a true AP view, the C-arm was positioned so that an oblique view of the foramen as noted above was visualized. The target area is just inferior to the "nose of the scotty dog" or sub pedicular. The soft tissues overlying this structure were infiltrated with 2-3 ml. of 1% Lidocaine without Epinephrine.  The spinal needle was inserted toward the target using a "trajectory" view along the fluoroscope beam.  Under AP and lateral visualization, the needle was advanced so it did not puncture dura and was located close the 6 O'Clock position of the pedical in AP tracterory. Biplanar projections were used to confirm position. Aspiration was confirmed to be negative for CSF and/or blood. A 1-2 ml. volume of  Isovue-250 was injected and flow of contrast was noted at each level. Radiographs were obtained for documentation purposes.   After attaining the desired flow of contrast documented above, a 0.5 to 1.0 ml test dose of 0.25% Marcaine was injected into each respective transforaminal space.  The patient was observed for 90 seconds post injection.  After no sensory deficits were  reported, and normal lower extremity motor function was noted,   the above injectate was administered so that equal amounts of the injectate were placed at each foramen (level) into the transforaminal epidural space.   Additional Comments:  The patient tolerated the procedure well Dressing: 2 x 2 sterile gauze and Band-Aid    Post-procedure details: Patient was observed during the procedure. Post-procedure instructions were reviewed.  Patient left the clinic in stable condition.     Clinical History: MRI LUMBAR SPINE WITHOUT CONTRAST   TECHNIQUE: Multiplanar, multisequence MR imaging of the lumbar spine was performed. No intravenous contrast was administered.   COMPARISON:  None.   FINDINGS: Segmentation:  Standard.   Alignment:  Trace retrolisthesis L2 on L3 is noted.   Vertebrae: Marked degenerative endplate signal change is present at L4-5. Marrow signal is otherwise unremarkable. No fracture.   Conus medullaris: Extends to the L1 level and appears normal.   Paraspinal and other soft tissues: Negative.   Disc levels:   T11-12 is imaged in the sagittal plane only and negative.   T12-L1:  Negative.   L1-2:  Negative.   L2-3:  Negative.   L3-4: Small left subarticular recess and foraminal protrusion causes mild narrowing in the subarticular recess. The left foramen remains open. The right foramen is patent.   L4-5: There is severe loss of disc space height. Shallow disc bulge and endplate spur are identified. Mild to moderate foraminal narrowing is more notable on the left. There is mild narrowing in the right subarticular recess.   L5-S1: Minimal disc bulge without stenosis.   IMPRESSION: Shallow disc bulge and endplate spur at U7-2 cause mild narrowing in the right subarticular recess and mild to moderate bilateral foraminal narrowing, worse on the left. No nerve root compression is identified.   Mild narrowing in the left subarticular recess at L3-4  by disc. No nerve root compression is identified.     Electronically Signed   By: Inge Rise M.D.   On: 03/15/2017 00:06     Objective:  VS:  HT:     WT:    BMI:      BP:134/86   HR:74bpm   TEMP: ( )   RESP:  Physical Exam Vitals and nursing note reviewed.  Constitutional:      General: She is not in acute distress.    Appearance: Normal appearance. She is not ill-appearing.  HENT:     Head: Normocephalic and atraumatic.     Right Ear: External ear normal.     Left Ear: External ear normal.  Eyes:     Extraocular Movements: Extraocular movements intact.  Cardiovascular:     Rate and Rhythm: Normal rate.     Pulses: Normal pulses.  Pulmonary:     Effort: Pulmonary effort is normal. No respiratory distress.  Abdominal:     General: There is no distension.     Palpations: Abdomen is soft.  Musculoskeletal:        General: Tenderness present.     Cervical back: Neck supple.     Right lower leg: No edema.     Left lower  leg: No edema.     Comments: Patient has good distal strength with no pain over the greater trochanters.  No clonus or focal weakness.  Does get some pain with logrolling of the right hip and endrange of external and internal rotation.  Skin:    Findings: No erythema, lesion or rash.  Neurological:     General: No focal deficit present.     Mental Status: She is alert and oriented to person, place, and time.     Sensory: No sensory deficit.     Motor: No weakness or abnormal muscle tone.     Coordination: Coordination normal.  Psychiatric:        Mood and Affect: Mood normal.        Behavior: Behavior normal.     Imaging: No results found.

## 2021-08-17 NOTE — Telephone Encounter (Signed)
 Imaging is requesting that the order for MRI be with contrast and without contrast.   They reported that they were going to call her to get it set up.  FYI

## 2021-08-18 ENCOUNTER — Telehealth: Payer: Self-pay | Admitting: Internal Medicine

## 2021-08-18 MED ORDER — DIAZEPAM 5 MG PO TABS: ORAL_TABLET | ORAL | 0 refills | Status: DC

## 2021-08-18 NOTE — Telephone Encounter (Signed)
Sent!

## 2021-08-18 NOTE — Addendum Note (Signed)
Addended by: Binnie Rail on: 08/18/2021 10:14 AM   Modules accepted: Orders

## 2021-08-18 NOTE — Telephone Encounter (Signed)
Patient called and is scheduled for an MRI on 301/2023.  She would like to be prescribed a valium before this procedure.  Please send to [facility] [place]

## 2021-08-18 NOTE — Telephone Encounter (Signed)
ordered

## 2021-08-18 NOTE — Telephone Encounter (Signed)
Last read by Kathryne Gin at  4:50 PM on 08/17/2021.

## 2021-08-20 ENCOUNTER — Other Ambulatory Visit: Payer: Self-pay | Admitting: Physical Medicine and Rehabilitation

## 2021-08-20 ENCOUNTER — Telehealth: Payer: Self-pay | Admitting: Internal Medicine

## 2021-08-20 MED ORDER — ONDANSETRON HCL 4 MG PO TABS: 4.0000 mg | ORAL_TABLET | Freq: Three times a day (TID) | ORAL | 0 refills | Status: DC | PRN

## 2021-08-20 NOTE — Telephone Encounter (Signed)
Zofran sent in-if nausea is not getting better she should follow-up with me next week.

## 2021-08-20 NOTE — Telephone Encounter (Signed)
Spoke with patient today. 

## 2021-08-20 NOTE — Telephone Encounter (Signed)
Pt has nausea. Wanting to know if MD can call in something for it. Not every day, particularly in the morning. Has a hard time eating.    [id number] [address], [place] Phone:  [phone]  Fax:  [phone]

## 2021-08-20 NOTE — Telephone Encounter (Signed)
She was post to come back for blood work and has not.  She really needs to come in next week and be reevaluated.  Her liver tests are elevated and I want her to have these rechecked.  Okay to take a dose of ibuprofen or Tylenol.  If her symptoms are not getting better she needs to go to the emergency room this weekend.  I do not know where her blood work is and I am concerned that her liver test may be worse, which could account for why she is not feeling well.

## 2021-08-20 NOTE — Telephone Encounter (Signed)
Book patient for Monday at 340

## 2021-08-23 ENCOUNTER — Encounter: Payer: Self-pay | Admitting: Internal Medicine

## 2021-08-23 ENCOUNTER — Other Ambulatory Visit (INDEPENDENT_AMBULATORY_CARE_PROVIDER_SITE_OTHER): Payer: Medicare PPO

## 2021-08-23 ENCOUNTER — Ambulatory Visit (INDEPENDENT_AMBULATORY_CARE_PROVIDER_SITE_OTHER): Payer: Medicare PPO | Admitting: Internal Medicine

## 2021-08-23 ENCOUNTER — Other Ambulatory Visit: Payer: Self-pay

## 2021-08-23 VITALS — BP 128/78 | HR 68 | Temp 98.2°F | Ht 63.0 in | Wt 151.6 lb

## 2021-08-23 DIAGNOSIS — R7989 Other specified abnormal findings of blood chemistry: Secondary | ICD-10-CM | POA: Diagnosis not present

## 2021-08-23 DIAGNOSIS — N2889 Other specified disorders of kidney and ureter: Secondary | ICD-10-CM | POA: Insufficient documentation

## 2021-08-23 DIAGNOSIS — N281 Cyst of kidney, acquired: Secondary | ICD-10-CM | POA: Insufficient documentation

## 2021-08-23 LAB — COMPREHENSIVE METABOLIC PANEL
ALT: 30 U/L (ref 0–35)
AST: 21 U/L (ref 0–37)
Albumin: 4.4 g/dL (ref 3.5–5.2)
Alkaline Phosphatase: 214 U/L — ABNORMAL HIGH (ref 39–117)
BUN: 16 mg/dL (ref 6–23)
CO2: 35 mEq/L — ABNORMAL HIGH (ref 19–32)
Calcium: 10 mg/dL (ref 8.4–10.5)
Chloride: 100 mEq/L (ref 96–112)
Creatinine, Ser: 1.02 mg/dL (ref 0.40–1.20)
GFR: 53.74 mL/min — ABNORMAL LOW (ref >60.00)
Glucose, Bld: 89 mg/dL (ref 70–99)
Potassium: 3.6 mEq/L (ref 3.5–5.1)
Sodium: 139 mEq/L (ref 135–145)
Total Bilirubin: 0.5 mg/dL (ref 0.2–1.2)
Total Protein: 7.9 g/dL (ref 6.0–8.3)

## 2021-08-23 NOTE — Progress Notes (Signed)
Subjective:    Patient ID: Pamela Rich, female    DOB: 1946/03/23, 76 y.o.   MRN: [id number]  This visit occurred during the SARS-CoV-2 public health emergency.  Safety protocols were in place, including screening questions prior to the visit, additional usage of staff PPE, and extensive cleaning of exam room while observing appropriate contact time as indicated for disinfecting solutions.    HPI The patient is here for an acute visit.  Elevated LFTs - blood work from her CPE on 2/7 showed elevated lfts - she had no symptoms at that time.  She was taking meloxicam at that time.  She started that around 1/25 for her leg pain.  She was taking Tylenol, but takes the pill at a time and does not take it often or in excess.  She does not recall feeling sick.    Occ nausea - none x couple of days.  Usually in the morning at breakfast.  No gerd, reflux.  She is not exactly sure when that started.  She states she think she had some over the summer last summer and that has been intermittent since then.    Medications and allergies reviewed with patient and updated if appropriate.  Patient Active Problem List   Diagnosis Date Noted   COVID-19 05/11/2021   Anxiety 08/04/2020   Medial epicondylitis of elbow, left 11/22/2019   PAD (peripheral artery disease) ([place]) 04/01/2019   De Quervain's syndrome (tenosynovitis) 03/12/2018   Hearing loss, wears hearing aids 06/22/2017   Lumbar radiculopathy 02/28/2017   Chronic pain of both knees 06/21/2016   Mild intermittent asthma 07/20/2015   Other allergic rhinitis 07/20/2015   Mixed hyperlipidemia 03/01/2012   Migraine without aura 04/05/2010   Vitamin D deficiency 03/19/2009   GERD (gastroesophageal reflux disease) 07/08/2008   DEGENERATIVE JOINT DISEASE 12/05/2007   Hypothyroidism 01/22/2007   IBS 09/28/2006   Osteopenia 09/28/2006    Current Outpatient Medications on File Prior to Visit  Medication Sig Dispense Refill   albuterol  (PROAIR HFA) 108 (90 Base) MCG/ACT inhaler INHALE 2 PUFFS EVERY 4 HOURS AS NEEDED FOR WHEEZING OR COUGHING SPELLS 18 g 2   Ascorbic Acid (VITAMIN C) 1000 MG tablet Take 1,000 mg by mouth daily.     aspirin 81 MG tablet Take 81 mg by mouth daily.     azelastine (ASTELIN) 0.1 % nasal spray Use in each nostril as directed 90 mL 1   Azelastine-Fluticasone 137-50 MCG/ACT SUSP Place into both nostrils.     Cholecalciferol (VITAMIN D3) 1000 UNITS CAPS Take by mouth daily.     cyclobenzaprine (FLEXERIL) 10 MG tablet Take 1 tablet (10 mg total) by mouth 3 (three) times daily as needed for muscle spasms. 30 tablet 0   diazepam (VALIUM) 5 MG tablet Take 1 by mouth 1 hour prior to MRI.  May repeat x1 if needed 2 tablet 0   famotidine (PEPCID) 40 MG tablet Take 1 tablet (40 mg total) by mouth daily. 90 tablet 1   fluticasone (FLONASE) 50 MCG/ACT nasal spray SPRAY 2 SPRAYS INTO EACH NOSTRIL EVERY DAY 48 mL 1   fluticasone (FLOVENT HFA) 44 MCG/ACT inhaler Inhale 2 puffs into the lungs 2 (two) times daily. 28.7 g 5   folic acid (FOLVITE) 1 MG tablet Take 1 mg by mouth daily.     levothyroxine (SYNTHROID) 50 MCG tablet TAKE 2 TABLETS BY MOUTH DAILY. 180 tablet 1   meloxicam (MOBIC) 15 MG tablet TAKE 1 TABLET BY MOUTH EVERY  DAY WITH FOOD 30 tablet 0   Multiple Vitamin (MULTIVITAMIN) tablet Take by mouth.     ondansetron (ZOFRAN) 4 MG tablet Take 1 tablet (4 mg total) by mouth every 8 (eight) hours as needed for nausea or vomiting. 20 tablet 0   pravastatin (PRAVACHOL) 20 MG tablet TAKE 1 TABLET BY MOUTH EVERY DAY 90 tablet 1   triamcinolone cream (KENALOG) 0.1 % SMARTSIG:1 sparingly Topical Twice Daily     No current facility-administered medications on file prior to visit.    Past Medical History:  Diagnosis Date   Allergy    SEASONAL   Fibroid    no surgery   Heart murmur    Hyperlipidemia    Hypothyroidism    Migraines    Mild intermittent asthma 07/20/2015   pt is currently not taking any  medications   Osteopenia    Sciatic nerve lesion 2018   Vitamin D deficiency     Past Surgical History:  Procedure Laterality Date   DENTAL SURGERY     DORSAL COMPARTMENT RELEASE Right 03/20/2018   Procedure: RIGHT OPEN  RELEASE DORSAL COMPARTMENT (DEQUERVAINS);  Surgeon: Daryll Brod, MD;  Location: [place] [facility];  Service: Orthopedics;  Laterality: Right;   HYSTEROSCOPY WITH D & C     LUMBAR FUSION     Dr Hal Neer   SEPTOPLASTY     SINOSCOPY     TUBAL LIGATION     WRIST ARTHROSCOPY WITH FOVEAL TRIANGULAR FIBROCARTILAGE COMPLEX REPAIR Right 03/20/2018   Procedure: RIGHT WRIST ARTHROSCOPY WITH DEBRIDEMENT, SCAPHOLUNATE/TRIANGULAR FIBROCARTILAGE COMPLEX REPAIR;  Surgeon: Daryll Brod, MD;  Location: [place];  Service: Orthopedics;  Laterality: Right;    Social History   Socioeconomic History   Marital status: Widowed    Spouse name: Not on file   Number of children: 3   Years of education: Not on file   Highest education level: Not on file  Occupational History   Occupation: retired  Tobacco Use   Smoking status: Former    Packs/day: 0.30    Years: 40.00    Pack years: 12.00    Types: Cigarettes    Quit date: 07/04/1974    Years since quitting: 47.1   Smokeless tobacco: Never   Tobacco comments:    smoked 1965-1976 , < 3 cig/ day  Vaping Use   Vaping Use: Never used  Substance and Sexual Activity   Alcohol use: No    Alcohol/week: 0.0 standard drinks    Comment: rarely   Drug use: No   Sexual activity: Not Currently    Partners: Male    Birth control/protection: Surgical, Post-menopausal    Comment: BTL  Other Topics Concern   Not on file  Social History Narrative   Exercise: walking, stretching, weights   Social Determinants of Health   Financial Resource Strain: Low Risk    Difficulty of Paying Living Expenses: Not hard at all  Food Insecurity: No Food Insecurity   Worried About Charity fundraiser in the Last Year: Never true    [place] in the Last Year: Never true  Transportation Needs: No Transportation Needs   Lack of Transportation (Medical): No   Lack of Transportation (Non-Medical): No  Physical Activity: Sufficiently Active   Days of Exercise per Week: 7 days   Minutes of Exercise per Session: 60 min  Stress: No Stress Concern Present   Feeling of Stress : Not at all  Social Connections: Moderately Integrated   Frequency  of Communication with Friends and Family: More than three times a week   Frequency of Social Gatherings with Friends and Family: More than three times a week   Attends Religious Services: More than 4 times per year   Active Member of [employer] or Organizations: Yes   Attends Archivist Meetings: More than 4 times per year   Marital Status: Widowed    Family History  Problem Relation Age of Onset   Hypertension Mother    Heart disease Mother        CHF; AS   Osteoporosis Mother    Heart attack Mother 60   Allergic rhinitis Mother    Diabetes Father    Stroke Father        onset in 20s   Cancer Brother        malignant brain tumor   Colon cancer Neg Hx    Esophageal cancer Neg Hx    Rectal cancer Neg Hx    Stomach cancer Neg Hx    Angioedema Neg Hx    Asthma Neg Hx    Eczema Neg Hx    Immunodeficiency Neg Hx    Urticaria Neg Hx    Breast cancer Neg Hx     Review of Systems  Constitutional:  Negative for fever.  Gastrointestinal:  Positive for nausea (intermittent - ? summer, not sure when it started more recnetly). Negative for abdominal pain, blood in stool, constipation and diarrhea.       Gerd controlled      Objective:   Vitals:   08/23/21 1551  BP: 128/78  Pulse: 68  Temp: 98.2 F (36.8 C)  SpO2: 97%   BP Readings from Last 3 Encounters:  08/23/21 128/78  08/12/21 134/86  08/10/21 124/76   Wt Readings from Last 3 Encounters:  08/23/21 151 lb 9.6 oz (68.8 kg)  08/10/21 156 lb 3.2 oz (70.9 kg)  12/09/20 154 lb 12.8 oz (70.2 kg)   Body  mass index is 26.85 kg/m.   Physical Exam Constitutional:      General: She is not in acute distress.    Appearance: Normal appearance. She is not ill-appearing.  HENT:     Head: Normocephalic and atraumatic.  Eyes:     General: No scleral icterus. Abdominal:     General: There is no distension.     Palpations: Abdomen is soft. There is no mass.     Tenderness: There is no abdominal tenderness.     Comments: No hepatosplenomegaly  Skin:    General: Skin is warm and dry.  Neurological:     Mental Status: She is alert.       Lab Results  Component Value Date   WBC 6.2 08/10/2021   HGB 12.7 08/10/2021   HCT 38.5 08/10/2021   PLT 314.0 08/10/2021   GLUCOSE 89 08/23/2021   CHOL 168 08/10/2021   TRIG 69.0 08/10/2021   HDL 66.30 08/10/2021   LDLDIRECT 151.9 12/05/2007   LDLCALC 88 08/10/2021   ALT 30 08/23/2021   AST 21 08/23/2021   NA 139 08/23/2021   K 3.6 08/23/2021   CL 100 08/23/2021   CREATININE 1.02 08/23/2021   BUN 16 08/23/2021   CO2 35 (H) 08/23/2021   TSH 4.04 08/10/2021   MICROALBUR 0.2 01/22/2007        Assessment & Plan:    See Problem List for Assessment and Plan of chronic medical problems.

## 2021-08-23 NOTE — Assessment & Plan Note (Addendum)
Acute Reviewed blood work from physical blood work and most recent blood work-today Reviewed ultrasound results Elevated LFTs with physical blood work 2/7-at that time she was asymptomatic She was taking meloxicam and very low doses of Tylenol-?  Related to meloxicam LFTs today have improved, although alk phos is still elevated Repeat blood work in 3-4 weeks to recheck No longer taking meloxicam, can restart Tylenol, which she takes at a very low dose Viral hepatitis blood work, ANA pending Advised to avoid meloxicam for now

## 2021-08-23 NOTE — Assessment & Plan Note (Signed)
New Reviewed ultrasound which showed left renal mass concerning for possible renal cell carcinoma MRI ordered-scheduled for 3/1 Urology referral ordered-scheduled for 3/6 ?  Also possible cause of elevated alkaline phosphatase

## 2021-08-23 NOTE — Patient Instructions (Addendum)
° ° ° °  Blood work was ordered.   Have this done in about one month.  Go directly to the lab and have this done in about one month.

## 2021-08-24 LAB — HEPATITIS C ANTIBODY
Hepatitis C Ab: NONREACTIVE
SIGNAL TO CUT-OFF: <0.02 (ref <1.00)

## 2021-08-24 LAB — HEPATITIS A ANTIBODY, TOTAL: Hepatitis A AB,Total: REACTIVE — AB

## 2021-08-24 LAB — ANA: Anti Nuclear Antibody (ANA): NEGATIVE

## 2021-08-24 LAB — HEPATITIS B SURFACE ANTIGEN: Hepatitis B Surface Ag: NONREACTIVE

## 2021-08-24 LAB — HEPATITIS B SURFACE ANTIBODY,QUALITATIVE: Hep B S Ab: NONREACTIVE

## 2021-08-25 ENCOUNTER — Other Ambulatory Visit: Payer: Medicare PPO

## 2021-08-26 ENCOUNTER — Other Ambulatory Visit: Payer: Self-pay | Admitting: Internal Medicine

## 2021-09-01 ENCOUNTER — Ambulatory Visit
Admission: RE | Admit: 2021-09-01 | Discharge: 2021-09-01 | Disposition: A | Discharge status: Home / Self Care | Payer: Medicare PPO | Source: Ambulatory Visit | Attending: Internal Medicine | Admitting: Internal Medicine

## 2021-09-01 ENCOUNTER — Other Ambulatory Visit: Payer: Self-pay

## 2021-09-01 DIAGNOSIS — N281 Cyst of kidney, acquired: Secondary | ICD-10-CM | POA: Diagnosis not present

## 2021-09-01 DIAGNOSIS — C801 Malignant (primary) neoplasm, unspecified: Secondary | ICD-10-CM | POA: Diagnosis not present

## 2021-09-01 DIAGNOSIS — N2889 Other specified disorders of kidney and ureter: Secondary | ICD-10-CM | POA: Diagnosis not present

## 2021-09-01 MED ORDER — GADOBENATE DIMEGLUMINE 529 MG/ML IV SOLN
14.0000 mL | Freq: Once | INTRAVENOUS | Status: AC | PRN
  Administered 2021-09-01: 14 mL via INTRAVENOUS

## 2021-09-02 ENCOUNTER — Encounter: Payer: Self-pay | Admitting: Internal Medicine

## 2021-09-02 ENCOUNTER — Ambulatory Visit (HOSPITAL_BASED_OUTPATIENT_CLINIC_OR_DEPARTMENT_OTHER): Payer: Medicare PPO | Admitting: Obstetrics & Gynecology

## 2021-09-06 DIAGNOSIS — N281 Cyst of kidney, acquired: Secondary | ICD-10-CM | POA: Diagnosis not present

## 2021-09-07 DIAGNOSIS — M25511 Pain in right shoulder: Secondary | ICD-10-CM | POA: Diagnosis not present

## 2021-09-10 ENCOUNTER — Other Ambulatory Visit: Payer: Self-pay

## 2021-09-13 ENCOUNTER — Other Ambulatory Visit: Payer: Self-pay

## 2021-09-13 DIAGNOSIS — H903 Sensorineural hearing loss, bilateral: Secondary | ICD-10-CM | POA: Diagnosis not present

## 2021-09-20 ENCOUNTER — Other Ambulatory Visit (INDEPENDENT_AMBULATORY_CARE_PROVIDER_SITE_OTHER): Payer: Medicare PPO

## 2021-09-20 ENCOUNTER — Other Ambulatory Visit: Payer: Self-pay

## 2021-09-20 DIAGNOSIS — H43813 Vitreous degeneration, bilateral: Secondary | ICD-10-CM | POA: Diagnosis not present

## 2021-09-20 DIAGNOSIS — R7989 Other specified abnormal findings of blood chemistry: Secondary | ICD-10-CM | POA: Diagnosis not present

## 2021-09-20 DIAGNOSIS — H04123 Dry eye syndrome of bilateral lacrimal glands: Secondary | ICD-10-CM | POA: Diagnosis not present

## 2021-09-20 DIAGNOSIS — H2513 Age-related nuclear cataract, bilateral: Secondary | ICD-10-CM | POA: Diagnosis not present

## 2021-09-20 DIAGNOSIS — H5213 Myopia, bilateral: Secondary | ICD-10-CM | POA: Diagnosis not present

## 2021-09-20 LAB — HEPATIC FUNCTION PANEL
ALT: 14 U/L (ref 0–35)
AST: 18 U/L (ref 0–37)
Albumin: 4 g/dL (ref 3.5–5.2)
Alkaline Phosphatase: 86 U/L (ref 39–117)
Bilirubin, Direct: 0 mg/dL (ref 0.0–0.3)
Total Bilirubin: 0.4 mg/dL (ref 0.2–1.2)
Total Protein: 6.8 g/dL (ref 6.0–8.3)

## 2021-09-21 NOTE — Progress Notes (Signed)
? ?FOLLOW UP ?Date of Service/Encounter:  09/22/21 ? ? ?Subjective:  ?Pamela Rich (DOB: 1945/12/19) is a 76 y.o. female who returns to the [facility] on 09/22/2021 in re-evaluation of the following: asthma and allergic rhinitis ?History obtained from: chart review and patient. ? ?For Review, LV was on 03/12/20  with Dr. Verlin Fester.  FEV1 88% at last visit. ?Asthma controlled with flovent 44 block therapy.     ? ?Pertinent History/Diagnostics:  ?- Asthma: mild intermittent ? - pre/post spirometry (2017): mild reduction in the forced vital capacity and FEV1 with significant improvement in the FEV1 after albuterol  ? - AEC 300 on 08/10/21 ?- Allergic Rhinitis:  ? - SPT environmental panel (07/20/15): + mold on IDs only ? - She has had nasal surgery by Dr. Ernesto Rutherford in 1979.  No longer actively followed by ENT ? ?Today she presents for follow-up. ?She is a previous patient of Dr. Verlin Fester.  She has been well controlled under his care and remained a patient of his at his new location.  However, this has become cumbersome due to the drive. ? ?She is having an increase in symptoms due to spring season and would like for [place] to take over her care. In general, she stays controlled with fluticasone/azelastine nasal spray combination 2 sprays BID. ? ?Her asthma has been well-controlled.   ?She uses her Flovent 110, 2 puffs BID during springtime only.  She did start this a few weeks ago.   ?Fall and Spring are her worst seasons.  ? ?Allergies as of 09/22/2021   ? ?   Reactions  ? Lexapro [escitalopram] Other (See Comments)  ? Headache, made her feel sad  ? ?  ? ?  ?Medication List  ?  ? ?  ? Accurate as of September 22, 2021 12:36 PM. If you have any questions, ask your nurse or doctor.  ?  ?  ? ?  ? ?STOP taking these medications   ? ?azelastine 0.1 % nasal spray ?Commonly known as: ASTELIN ?Stopped by: Sigurd Sos, MD ?  ?cyclobenzaprine 10 MG tablet ?Commonly known as: FLEXERIL ?Stopped by: Sigurd Sos, MD ?   ?diazepam 5 MG tablet ?Commonly known as: VALIUM ?Stopped by: Sigurd Sos, MD ?  ?fluticasone 50 MCG/ACT nasal spray ?Commonly known as: FLONASE ?Stopped by: Sigurd Sos, MD ?  ?triamcinolone cream 0.1 % ?Commonly known as: KENALOG ?Stopped by: Sigurd Sos, MD ?  ? ?  ? ?TAKE these medications   ? ?albuterol 108 (90 Base) MCG/ACT inhaler ?Commonly known as: ProAir HFA ?INHALE 2 PUFFS EVERY 4 HOURS AS NEEDED FOR WHEEZING OR COUGHING SPELLS ?What changed: Another medication with the same name was added. Make sure you understand how and when to take each. ?Changed by: Sigurd Sos, MD ?  ?albuterol 108 (90 Base) MCG/ACT inhaler ?Commonly known as: VENTOLIN HFA ?Inhale 2 puffs into the lungs every 6 (six) hours as needed for wheezing or shortness of breath. ?What changed: You were already taking a medication with the same name, and this prescription was added. Make sure you understand how and when to take each. ?Changed by: Sigurd Sos, MD ?  ?aspirin 81 MG tablet ?Take 81 mg by mouth daily. ?  ?Azelastine-Fluticasone 137-50 MCG/ACT Susp ?Place 2 sprays into both nostrils in the morning and at bedtime. ?What changed:  ?how much to take ?when to take this ?Changed by: Sigurd Sos, MD ?  ?famotidine 40 MG tablet ?Commonly known as: PEPCID ?Take 1 tablet (40 mg  total) by mouth daily. ?  ?Flovent HFA 110 MCG/ACT inhaler ?Generic drug: fluticasone ?Inhale 2 puffs into the lungs in the morning and at bedtime. During Spring season and/or respiratory illness/asthma flares until symptoms resolve. ?What changed:  ?how much to take ?when to take this ?additional instructions ?Another medication with the same name was removed. Continue taking this medication, and follow the directions you see here. ?Changed by: Sigurd Sos, MD ?  ?folic acid 1 MG tablet ?Commonly known as: FOLVITE ?Take 1 mg by mouth daily. ?  ?levothyroxine 50 MCG tablet ?Commonly known as: SYNTHROID ?TAKE 2 TABLETS BY MOUTH DAILY ?  ?multivitamin tablet ?Take by  mouth. ?  ?ondansetron 4 MG tablet ?Commonly known as: Zofran ?Take 1 tablet (4 mg total) by mouth every 8 (eight) hours as needed for nausea or vomiting. ?  ?pravastatin 20 MG tablet ?Commonly known as: PRAVACHOL ?TAKE 1 TABLET BY MOUTH EVERY DAY ?  ?vitamin C 1000 MG tablet ?Take 1,000 mg by mouth daily. ?  ?Vitamin D3 25 MCG (1000 UT) Caps ?Take by mouth daily. ?  ? ?  ? ?Past Medical History:  ?Diagnosis Date  ? Allergy   ? SEASONAL  ? Fibroid   ? no surgery  ? Heart murmur   ? Hyperlipidemia   ? Hypothyroidism   ? Migraines   ? Mild intermittent asthma 07/20/2015  ? pt is currently not taking any medications  ? Osteopenia   ? Sciatic nerve lesion 2018  ? Vitamin D deficiency   ? ?Past Surgical History:  ?Procedure Laterality Date  ? DENTAL SURGERY    ? DORSAL COMPARTMENT RELEASE Right 03/20/2018  ? Procedure: RIGHT OPEN  RELEASE DORSAL COMPARTMENT (DEQUERVAINS);  Surgeon: Daryll Brod, MD;  Location: [facility];  Service: Orthopedics;  Laterality: Right;  ? HYSTEROSCOPY WITH D & C    ? LUMBAR FUSION    ? Dr Hal Neer  ? SEPTOPLASTY    ? SINOSCOPY    ? TUBAL LIGATION    ? WRIST ARTHROSCOPY WITH FOVEAL TRIANGULAR FIBROCARTILAGE COMPLEX REPAIR Right 03/20/2018  ? Procedure: RIGHT WRIST ARTHROSCOPY WITH DEBRIDEMENT, SCAPHOLUNATE/TRIANGULAR FIBROCARTILAGE COMPLEX REPAIR;  Surgeon: Daryll Brod, MD;  Location: [facility];  Service: Orthopedics;  Laterality: Right;  ? ?Otherwise, there have been no changes to her past medical history, surgical history, family history, or social history. ? ?ROS: All others negative except as noted per HPI.  ? ?Objective:  ?BP (!) 142/78   Pulse 67   Temp (!) 97.3 ?F (36.3 ?C) (Temporal)   Resp (!) 21   LMP 07/04/2000   SpO2 97%  ?There is no height or weight on file to calculate BMI. ?Physical Exam: ?General Appearance:  Alert, cooperative, no distress, appears stated age  ?Head:  Normocephalic, without obvious abnormality, atraumatic  ?Eyes:  Conjunctiva  clear, EOM's intact  ?Nose: Nares normal,  mildly edematous turbinates, normal mucosa, no visible anterior polyps, and septum midline  ?Throat: Lips, tongue normal; teeth and gums normal, normal posterior oropharynx  ?Neck: Supple, symmetrical  ?Lungs:   clear to auscultation bilaterally, Respirations unlabored, no coughing  ?Heart:  regular rate and rhythm and no murmur, Appears well perfused  ?Extremities: No edema  ?Skin: Skin color, texture, turgor normal, no rashes or lesions on visualized portions of skin  ?Neurologic: No gross deficits  ? ? ?Spirometry:  ?Tracings reviewed. Her effort: Good reproducible efforts. ?FVC: 2.05L ?FEV1: 1.58L, 80% predicted ?FEV1/FVC ratio: 99% ?Interpretation: Spirometry consistent with normal pattern.  ?Please  see scanned spirometry results for details. ? ?Assessment/Plan  ?Overall her intermittent asthma and allergic rhinitis are controlled on current regimen for the past several years.  She is experiencing increase in symptoms which is normal for her during this time of year.  We have refilled her medications for her, and we will follow-up yearly.   ? ?Mild intermittent asthma ?Well-controlled. ?Your breathing test today looked great. ?Continue albuterol HFA, 1-2 inhalations every 4-6 hours if needed. ?During Spring season, respiratory tract infections or asthma flares, add Flovent 110?g 2 inhalations via spacer device 2 times per day until symptoms have returned to baseline. ? ?Other allergic rhinitis ?Stable. ?Continue appropriate allergen avoidance measures. ?Continue azelastine/fluticasone nasal spray if needed. ?Nasal saline spray (i.e., Simply Saline) or nasal saline lavage (i.e., NeilMed) is recommended as needed and prior to medicated nasal sprays. ? ?Return in about 12 months, sooner if needed.  ? ?Sigurd Sos, MD  ?[facility] ? ? ? ? ? ? ?

## 2021-09-22 ENCOUNTER — Other Ambulatory Visit: Payer: Self-pay

## 2021-09-22 ENCOUNTER — Encounter: Payer: Self-pay | Admitting: Internal Medicine

## 2021-09-22 ENCOUNTER — Ambulatory Visit: Payer: Medicare PPO | Admitting: Internal Medicine

## 2021-09-22 VITALS — BP 142/78 | HR 67 | Temp 97.3°F | Resp 21

## 2021-09-22 DIAGNOSIS — J4520 Mild intermittent asthma, uncomplicated: Secondary | ICD-10-CM

## 2021-09-22 DIAGNOSIS — J3089 Other allergic rhinitis: Secondary | ICD-10-CM | POA: Diagnosis not present

## 2021-09-22 MED ORDER — ALBUTEROL SULFATE HFA 108 (90 BASE) MCG/ACT IN AERS: 2.0000 | INHALATION_SPRAY | Freq: Four times a day (QID) | RESPIRATORY_TRACT | 3 refills | Status: DC | PRN

## 2021-09-22 MED ORDER — AZELASTINE-FLUTICASONE 137-50 MCG/ACT NA SUSP: 2.0000 | Freq: Two times a day (BID) | NASAL | 12 refills | Status: DC

## 2021-09-22 MED ORDER — FLOVENT HFA 110 MCG/ACT IN AERO: 2.0000 | INHALATION_SPRAY | Freq: Two times a day (BID) | RESPIRATORY_TRACT | 12 refills | Status: DC

## 2021-09-22 NOTE — Patient Instructions (Addendum)
Mild intermittent asthma ?Well-controlled. ?Your breathing test today looked great. ?Continue albuterol HFA, 1-2 inhalations every 4-6 hours if needed. ?During Spring season, respiratory tract infections or asthma flares, add Flovent 110?g 2 inhalations via spacer device 2 times per day until symptoms have returned to baseline. ? ?Other allergic rhinitis ?Stable. ?Continue appropriate allergen avoidance measures. ?Continue azelastine/fluticasone nasal spray if needed. ?Nasal saline spray (i.e., Simply Saline) or nasal saline lavage (i.e., NeilMed) is recommended as needed and prior to medicated nasal sprays. ? ? ?Return in about 12 months, sooner if needed.  ? ?

## 2021-09-23 DIAGNOSIS — M25511 Pain in right shoulder: Secondary | ICD-10-CM | POA: Diagnosis not present

## 2021-10-06 DIAGNOSIS — Z7982 Long term (current) use of aspirin: Secondary | ICD-10-CM | POA: Diagnosis not present

## 2021-10-06 DIAGNOSIS — E785 Hyperlipidemia, unspecified: Secondary | ICD-10-CM | POA: Diagnosis not present

## 2021-10-06 DIAGNOSIS — Z7951 Long term (current) use of inhaled steroids: Secondary | ICD-10-CM | POA: Diagnosis not present

## 2021-10-06 DIAGNOSIS — E039 Hypothyroidism, unspecified: Secondary | ICD-10-CM | POA: Diagnosis not present

## 2021-10-06 DIAGNOSIS — J45909 Unspecified asthma, uncomplicated: Secondary | ICD-10-CM | POA: Diagnosis not present

## 2021-10-06 DIAGNOSIS — K219 Gastro-esophageal reflux disease without esophagitis: Secondary | ICD-10-CM | POA: Diagnosis not present

## 2021-10-06 DIAGNOSIS — H269 Unspecified cataract: Secondary | ICD-10-CM | POA: Diagnosis not present

## 2021-10-06 DIAGNOSIS — R32 Unspecified urinary incontinence: Secondary | ICD-10-CM | POA: Diagnosis not present

## 2021-10-06 DIAGNOSIS — R030 Elevated blood-pressure reading, without diagnosis of hypertension: Secondary | ICD-10-CM | POA: Diagnosis not present

## 2021-10-07 ENCOUNTER — Ambulatory Visit: Payer: Medicare PPO | Admitting: Internal Medicine

## 2021-10-13 ENCOUNTER — Ambulatory Visit: Payer: Medicare PPO | Admitting: Allergy

## 2021-10-19 ENCOUNTER — Telehealth: Payer: Self-pay | Admitting: *Deleted

## 2021-10-19 ENCOUNTER — Other Ambulatory Visit: Payer: Self-pay | Admitting: *Deleted

## 2021-10-19 MED ORDER — PREDNISONE 10 MG PO TABS: 20.0000 mg | ORAL_TABLET | Freq: Every day | ORAL | 0 refills | Status: AC

## 2021-10-19 MED ORDER — AMOXICILLIN-POT CLAVULANATE 875-125 MG PO TABS: 1.0000 | ORAL_TABLET | Freq: Two times a day (BID) | ORAL | 0 refills | Status: AC

## 2021-10-19 NOTE — Telephone Encounter (Signed)
Patient called and stated for the last week that she has been having sinus pressure and congestion, and cough. She states that she was outside a lot yesterday with the wind and pollen and her cough got worse last night along with a low grade fever of 99.7. She stated that has been taking all of her medications as prescribed and took an at home COVID test today which was negative. She is wondering if she can have something called in to help her.  ?

## 2021-10-19 NOTE — Telephone Encounter (Signed)
Called and spoke with patient and she confirmed that she has been doing the Flovent BID. Augmentin and Prednisone have been sent to patients pharmacy, patient has been advised, patient verbalized understanding.  ?

## 2021-10-21 NOTE — Telephone Encounter (Signed)
Called and advised to patient, patient verbalized understanding.  ?

## 2021-10-21 NOTE — Telephone Encounter (Signed)
Patient called back and stated that she is not feeling better with the prednisone and antibiotic even though its only been two days. She has been using her Albuterol every 4-6 hours and her Flovent 2 times daily. She states that she was coughing so bad this morning that she thought she was going to have to call 911. She states that she does not want to go to the hospital if she can help it because she has a fear that she is going to be placed on a ventilator and pass away since that happened to her mother and her husband. I unfortunately did not have any openings today in [place] and she didn't think she could drive to [facility] to be seen or find a ride. I did schedule her for an office visit tomorrow morning with Dr. Nelva Bush in [place]. She is planning on coming in then. I told her if she needs anything until then to please call me.  ?

## 2021-10-22 ENCOUNTER — Ambulatory Visit: Payer: Medicare PPO | Admitting: Allergy

## 2021-10-22 ENCOUNTER — Encounter: Payer: Self-pay | Admitting: Allergy

## 2021-10-22 VITALS — BP 146/84 | HR 79 | Temp 97.3°F | Resp 18 | Ht 63.0 in | Wt 150.8 lb

## 2021-10-22 DIAGNOSIS — J018 Other acute sinusitis: Secondary | ICD-10-CM

## 2021-10-22 DIAGNOSIS — J3089 Other allergic rhinitis: Secondary | ICD-10-CM

## 2021-10-22 DIAGNOSIS — J4520 Mild intermittent asthma, uncomplicated: Secondary | ICD-10-CM

## 2021-10-22 NOTE — Progress Notes (Signed)
? ? ?Follow-up Note ? ?RE: Pamela Rich MRN: [id number] DOB: [birth year] ?Date of Office Visit: 10/22/2021 ? ? ?History of present illness: ?Pamela Rich is a 76 y.o. female presenting today for sick visit checkup.  She has history of asthma and allergic rhinitis.  She was last seen in office by Dr. Simona Huh on 09/22/2021. ? ?She states she has been sick for past 2 weeks.  She reports having symptoms of sinus pressure, congestion, cough, low-grade fever less than 100.  She took a home COVID test that was negative.  She did call our office with the symptoms 4 days ago.  She was recommended to take a course of Augmentin as well as prednisone for 5 days.  This is day 4 of Augmentin and she states she has 1 more day of prednisone.  She feels like these medications have been helpful but she was hoping for something to work very quickly to relieve her symptoms.  She states yesterday a lot of mucus and drainage was "pouring" out of her sinuses and more sneezing.   ?She has been using albuterol a few times over the past as a week but she tries not to use it as much as she does feel it causes headache.  She did at her Flovent and taking it 2 puffs twice a day.  She also continues to use her azelastine/fluticasone nasal spray twice a day as well as her saline rinses. ?She wants some reassurance today and to make sure she did not have any "fluid in my lungs" or signs of pneumonia. ? ?Review of systems: ?Review of Systems  ?Constitutional:  Positive for fever.  ?HENT:  Positive for congestion, rhinorrhea, sinus pressure and sneezing.   ?Eyes: Negative.   ?Respiratory:  Positive for cough.   ?Cardiovascular: Negative.   ?Gastrointestinal: Negative.   ?Musculoskeletal: Negative.   ?Skin: Negative.   ?Allergic/Immunologic: Negative.   ?Neurological: Negative.    ? ?All other systems negative unless noted above in HPI ? ?Past medical/social/surgical/family history have been reviewed and are unchanged unless specifically  indicated below. ? ?No changes ? ?Medication List: ?Current Outpatient Medications  ?Medication Sig Dispense Refill  ? albuterol (PROAIR HFA) 108 (90 Base) MCG/ACT inhaler INHALE 2 PUFFS EVERY 4 HOURS AS NEEDED FOR WHEEZING OR COUGHING SPELLS 18 g 2  ? albuterol (VENTOLIN HFA) 108 (90 Base) MCG/ACT inhaler Inhale 2 puffs into the lungs every 6 (six) hours as needed for wheezing or shortness of breath. 8 g 3  ? amoxicillin-clavulanate (AUGMENTIN) 875-125 MG tablet Take 1 tablet by mouth 2 (two) times daily for 10 days. 20 tablet 0  ? Ascorbic Acid (VITAMIN C) 1000 MG tablet Take 1,000 mg by mouth daily.    ? aspirin 81 MG tablet Take 81 mg by mouth daily.    ? Azelastine-Fluticasone 137-50 MCG/ACT SUSP Place 2 sprays into both nostrils in the morning and at bedtime. 23 g 12  ? Cholecalciferol (VITAMIN D3) 1000 UNITS CAPS Take by mouth daily.    ? famotidine (PEPCID) 40 MG tablet Take 1 tablet (40 mg total) by mouth daily. 90 tablet 1  ? FLOVENT HFA 110 MCG/ACT inhaler Inhale 2 puffs into the lungs in the morning and at bedtime. During Spring season and/or respiratory illness/asthma flares until symptoms resolve. 1 each 12  ? folic acid (FOLVITE) 1 MG tablet Take 1 mg by mouth daily.    ? levothyroxine (SYNTHROID) 50 MCG tablet TAKE 2 TABLETS BY MOUTH DAILY 180 tablet 1  ?  Multiple Vitamin (MULTIVITAMIN) tablet Take by mouth.    ? pravastatin (PRAVACHOL) 20 MG tablet TAKE 1 TABLET BY MOUTH EVERY DAY 90 tablet 1  ? predniSONE (DELTASONE) 10 MG tablet Take 2 tablets (20 mg total) by mouth daily with breakfast for 5 days. 10 tablet 0  ? ?No current facility-administered medications for this visit.  ?  ? ?Known medication allergies: ?Allergies  ?Allergen Reactions  ? Lexapro [Escitalopram] Other (See Comments)  ?  Headache, made her feel sad  ? ? ? ?Physical examination: ?Blood pressure (!) 146/84, pulse 79, temperature (!) 97.3 ?F (36.3 ?C), resp. rate 18, height '5\' 3"'$  (1.6 m), weight 150 lb 12.8 oz (68.4 kg), last  menstrual period 07/04/2000, SpO2 95 %. ? ?General: Alert, interactive, in no acute distress. ?HEENT: PERRLA, TMs pearly gray, turbinates moderately edematous without discharge, post-pharynx non erythematous. ?Neck: Supple without lymphadenopathy. ?Lungs: Clear to auscultation without wheezing, rhonchi or rales. {no increased work of breathing. ?CV: Normal S1, S2 without murmurs. ?Abdomen: Nondistended, nontender. ?Skin: Warm and dry, without lesions or rashes. ?Extremities:  No clubbing, cyanosis or edema. ?Neuro:   Grossly intact. ? ?Diagnositics/Labs: ? ?Spirometry: FEV1: 1.44L 72%, FVC: 1.9L 71% predicted.  This is still normal for age however it is slightly reduced from her previous study ? ?Assessment and plan: ?Acute sinusitis ?Complete Augmentin course as directed ?Complete prednisone 5-day burst as directed ?Continue nasal saline rinses ?Continue your routine medications as below including Flovent during illness ?Lung function testing still looks good however slightly decreased from previous study when you are feeling well ?Lung exam was unremarkable ? ?Mild intermittent asthma ?Continue albuterol HFA, 1-2 inhalations every 4-6 hours if needed. ?During Spring season, respiratory tract infections or asthma flares, add Flovent 110?g 2 inhalations via spacer device 2 times per day until symptoms have returned to baseline. ? ?Other allergic rhinitis ?Continue appropriate allergen avoidance measures. ?Continue azelastine/fluticasone nasal spray if needed. ?Nasal saline spray (i.e., Simply Saline) or nasal saline lavage (i.e., NeilMed) is recommended as needed and prior to medicated nasal sprays. ? ? ?Return in about 12 months, sooner if needed.  ? ?I appreciate the opportunity to take part in [facility] care. Please do not hesitate to contact me with questions. ? ?Sincerely, ? ? ?Prudy Feeler, MD ?Allergy/Immunology ?[facility] ? ? ?

## 2021-10-22 NOTE — Patient Instructions (Signed)
Acute sinusitis ?Complete Augmentin course as directed ?Complete prednisone 5-day burst as directed ?Continue nasal saline rinses ?Continue your routine medications as below including Flovent during illness ?Lung function testing still looks good however slightly decreased from previous study when you are feeling well ?Lung exam was unremarkable ? ?Mild intermittent asthma ?Continue albuterol HFA, 1-2 inhalations every 4-6 hours if needed. ?During Spring season, respiratory tract infections or asthma flares, add Flovent 110?g 2 inhalations via spacer device 2 times per day until symptoms have returned to baseline. ? ?Other allergic rhinitis ?Continue appropriate allergen avoidance measures. ?Continue azelastine/fluticasone nasal spray if needed. ?Nasal saline spray (i.e., Simply Saline) or nasal saline lavage (i.e., NeilMed) is recommended as needed and prior to medicated nasal sprays. ? ? ?Return in about 12 months, sooner if needed.  ? ?

## 2021-10-26 ENCOUNTER — Telehealth: Payer: Self-pay | Admitting: Internal Medicine

## 2021-10-26 NOTE — Telephone Encounter (Signed)
Pamela Rich called in and states she is still coughing and still getting rid of mucus but is now having a fever on and off for the last 3 days and has been taking her antibiotics and last night her fever peaked 100.3.  She states her fevers have been low grade otherwise.  Please advise. ?

## 2021-10-27 ENCOUNTER — Encounter: Payer: Self-pay | Admitting: Internal Medicine

## 2021-10-27 ENCOUNTER — Ambulatory Visit (INDEPENDENT_AMBULATORY_CARE_PROVIDER_SITE_OTHER): Payer: Medicare PPO

## 2021-10-27 ENCOUNTER — Ambulatory Visit: Payer: Medicare PPO | Admitting: Internal Medicine

## 2021-10-27 ENCOUNTER — Telehealth: Payer: Self-pay | Admitting: Internal Medicine

## 2021-10-27 VITALS — BP 130/78 | HR 105 | Temp 99.4°F | Ht 63.0 in | Wt 150.0 lb

## 2021-10-27 DIAGNOSIS — J45901 Unspecified asthma with (acute) exacerbation: Secondary | ICD-10-CM | POA: Insufficient documentation

## 2021-10-27 DIAGNOSIS — R059 Cough, unspecified: Secondary | ICD-10-CM | POA: Diagnosis not present

## 2021-10-27 DIAGNOSIS — J069 Acute upper respiratory infection, unspecified: Secondary | ICD-10-CM | POA: Diagnosis not present

## 2021-10-27 DIAGNOSIS — J208 Acute bronchitis due to other specified organisms: Secondary | ICD-10-CM | POA: Diagnosis not present

## 2021-10-27 DIAGNOSIS — J4541 Moderate persistent asthma with (acute) exacerbation: Secondary | ICD-10-CM

## 2021-10-27 DIAGNOSIS — R062 Wheezing: Secondary | ICD-10-CM | POA: Diagnosis not present

## 2021-10-27 MED ORDER — PREDNISONE 10 MG PO TABS: ORAL_TABLET | ORAL | 0 refills | Status: DC

## 2021-10-27 NOTE — Telephone Encounter (Signed)
Pt called in and reports feeling sick, with a fever, cough, and head congestion.  ? ?She has been having symptoms for 9 days now. Has seen allergist (note in Chart) and states she thinks it may be something else going on and she would like to see her MD.  ? ?States she is concerned about fever- fever today 99.5.Feels like it has taken a toll on her body  ?

## 2021-10-27 NOTE — Assessment & Plan Note (Signed)
Acute ?Related to the viral illness-viral bronchitis ?Continue Flovent, albuterol ?Start prednisone taper ?

## 2021-10-27 NOTE — Telephone Encounter (Signed)
Spoke with patient. ? ?Moved appointment up for today. ?

## 2021-10-27 NOTE — Assessment & Plan Note (Signed)
Acute ?Symptoms started 10 days ago no not improving much ?Was started on Augmentin 9 days ago and there has been no improvement ?Chest x-ray today shows slight bronchial thickening likely viral pneumonia ?Have seen a lot of this going around and that is viral in nature ?She does have wheezing on exam-start prednisone taper 40 mg x 3 days and tapering 10 mg every 3 days ?Continue Flovent, albuterol and allergy medications ?Continue over-the-counter cold medications ?She will update me on how she is feeling in the next 24-48 hours ?Hold off on antibiotics ?

## 2021-10-27 NOTE — Patient Instructions (Addendum)
? ? ? ?  Have a chest xray today.  ? ? ?Medications changes include :   prednisone taper, stop augmentin. ? ? ? ?Your prescription(s) have been sent to your pharmacy.  ? ? ? ?

## 2021-10-27 NOTE — Progress Notes (Signed)
? ? ?Subjective:  ? ? Patient ID: Pamela Rich, female    DOB: 03-Apr-1946, 76 y.o.   MRN: [id number] ? ?This visit occurred during the SARS-CoV-2 public health emergency.  Safety protocols were in place, including screening questions prior to the visit, additional usage of staff PPE, and extensive cleaning of exam room while observing appropriate contact time as indicated for disinfecting solutions. ? ? ? ?HPI ?Pamela Rich is here for  ?Chief Complaint  ?Patient presents with  ? Cough  ?  Cough, congestion, post nasal drip  ? ? ? ? ?Her symptoms started 10 days ago ? ?She is experiencing low-grade fevers, nasal congestion with clear mucus, occasional mild ear pain, postnasal drainage, may be some sinus pressure, sore throat seems to be either from coughing or the drainage, hoarseness, cough, wheezing and headaches.  She did go to her allergy doctor because of her she thought this was allergies and they did prescribe her Augmentin.  She completes the 10 days tomorrow.  It is not helped.  She is having loose stools because of it.  She has had a decreased appetite and is not sure if that is related to the infection or the antibiotic. ? ?She has tried taking Augmentin - finishes 10 days tomorrow.  She is doing her inhalers and sprays as prescribed. ? ? ?Covid home test neg.   ? ? ?Medications and allergies reviewed with patient and updated if appropriate. ? ?Current Outpatient Medications on File Prior to Visit  ?Medication Sig Dispense Refill  ? albuterol (PROAIR HFA) 108 (90 Base) MCG/ACT inhaler INHALE 2 PUFFS EVERY 4 HOURS AS NEEDED FOR WHEEZING OR COUGHING SPELLS 18 g 2  ? albuterol (VENTOLIN HFA) 108 (90 Base) MCG/ACT inhaler Inhale 2 puffs into the lungs every 6 (six) hours as needed for wheezing or shortness of breath. 8 g 3  ? amoxicillin-clavulanate (AUGMENTIN) 875-125 MG tablet Take 1 tablet by mouth 2 (two) times daily for 10 days. 20 tablet 0  ? Ascorbic Acid (VITAMIN C) 1000 MG tablet Take 1,000 mg by mouth  daily.    ? aspirin 81 MG tablet Take 81 mg by mouth daily.    ? Azelastine-Fluticasone 137-50 MCG/ACT SUSP Place 2 sprays into both nostrils in the morning and at bedtime. 23 g 12  ? Cholecalciferol (VITAMIN D3) 1000 UNITS CAPS Take by mouth daily.    ? famotidine (PEPCID) 40 MG tablet Take 1 tablet (40 mg total) by mouth daily. 90 tablet 1  ? FLOVENT HFA 110 MCG/ACT inhaler Inhale 2 puffs into the lungs in the morning and at bedtime. During Spring season and/or respiratory illness/asthma flares until symptoms resolve. 1 each 12  ? folic acid (FOLVITE) 1 MG tablet Take 1 mg by mouth daily.    ? levothyroxine (SYNTHROID) 50 MCG tablet TAKE 2 TABLETS BY MOUTH DAILY 180 tablet 1  ? Multiple Vitamin (MULTIVITAMIN) tablet Take by mouth.    ? pravastatin (PRAVACHOL) 20 MG tablet TAKE 1 TABLET BY MOUTH EVERY DAY 90 tablet 1  ? ?No current facility-administered medications on file prior to visit.  ? ? ?Review of Systems  ?Constitutional:  Positive for fever (100.3, 99.7).  ?HENT:  Positive for congestion (clear), ear pain (occ), postnasal drip, sinus pressure, sore throat (from PND, cough) and voice change. Negative for sinus pain.   ?Respiratory:  Positive for cough and wheezing. Negative for shortness of breath.   ?Gastrointestinal:  Negative for diarrhea (loose stools from augmentin).  ?Neurological:  Positive for headaches.  ? ?   ?  Objective:  ? ?Vitals:  ? 10/27/21 1554  ?BP: 130/78  ?Pulse: (!) 105  ?Temp: 99.4 ?F (37.4 ?C)  ?SpO2: 97%  ? ?BP Readings from Last 3 Encounters:  ?10/27/21 130/78  ?10/22/21 (!) 146/84  ?09/22/21 (!) 142/78  ? ?Wt Readings from Last 3 Encounters:  ?10/27/21 150 lb (68 kg)  ?10/22/21 150 lb 12.8 oz (68.4 kg)  ?08/23/21 151 lb 9.6 oz (68.8 kg)  ? ?Body mass index is 26.57 kg/m?. ? ?  ?Physical Exam ?Constitutional:   ?   General: She is not in acute distress. ?   Appearance: Normal appearance. She is not ill-appearing.  ?HENT:  ?   Head: Normocephalic and atraumatic.  ?   Right Ear:  Tympanic membrane, ear canal and external ear normal.  ?   Left Ear: Tympanic membrane, ear canal and external ear normal.  ?   Mouth/Throat:  ?   Mouth: Mucous membranes are moist.  ?   Pharynx: No oropharyngeal exudate or posterior oropharyngeal erythema.  ?Eyes:  ?   Conjunctiva/sclera: Conjunctivae normal.  ?Cardiovascular:  ?   Rate and Rhythm: Normal rate and regular rhythm.  ?Pulmonary:  ?   Effort: Pulmonary effort is normal. No respiratory distress.  ?   Breath sounds: Wheezing present. No rales.  ?Musculoskeletal:  ?   Cervical back: Neck supple. No tenderness.  ?Lymphadenopathy:  ?   Cervical: No cervical adenopathy.  ?Skin: ?   General: Skin is warm and dry.  ?Neurological:  ?   Mental Status: She is alert.  ? ?   ? ?DG Chest 2 View ?CLINICAL DATA:  Upper respiratory infection. Cough and wheezing. ?Symptoms for 9 days. ? ?EXAM: ?CHEST - 2 VIEW ? ?COMPARISON:  Remote radiograph 01/01/2004 ? ?FINDINGS: ?The cardiomediastinal contours are normal. Chronic obscure a shin of ?the lower left heart border may represent chronic atelectasis or ?prominent epicardial fat pad, regardless unchanged from remote prior ?exam consistent with benign process. Slight bronchial thickening. ?Pulmonary vasculature is normal. No consolidation, pleural effusion, ?or pneumothorax. No acute osseous abnormalities are seen. ? ?IMPRESSION: ?Slight bronchial thickening. No pneumonia. ? ?Electronically Signed ?  By: Keith Rake M.D. ?  On: 10/27/2021 16:32 ? ? ? ? ? ?Assessment & Plan:  ? ? ?See Problem List for Assessment and Plan of chronic medical problems.  ? ? ? ? ?

## 2021-10-28 ENCOUNTER — Ambulatory Visit: Payer: Medicare PPO | Admitting: Internal Medicine

## 2021-12-16 ENCOUNTER — Encounter: Payer: Self-pay | Admitting: Internal Medicine

## 2021-12-16 NOTE — Progress Notes (Signed)
Subjective:    Patient ID: Pamela Rich, female    DOB: [birth year], 76 y.o.   MRN: [id number]      HPI Jimmy is here for  Chief Complaint  Patient presents with   Arthritis    Pain in both shoulders and arms    Arthritis in shoulder and arms R >> L)- very painful --  when she reaches up with either arm she has pain in her upper arms.  She does not have the strength in her arms- moving her self around in the body hurts and is difficulty.  She had an episode of this once before and she saw sports med and did PT and it helped.  This episode started 7-10 days ago.  Pain improved with rest, ice.  No obvious cause.    Saw sports med 11/2019 for left upper arm pain - dx with rotator cuff impingement or bursitis.  She had an injection.    She saw ortho in the past few months and was diagnosed with bursitis.     Medications and allergies reviewed with patient and updated if appropriate.  Current Outpatient Medications on File Prior to Visit  Medication Sig Dispense Refill   albuterol (PROAIR HFA) 108 (90 Base) MCG/ACT inhaler INHALE 2 PUFFS EVERY 4 HOURS AS NEEDED FOR WHEEZING OR COUGHING SPELLS 18 g 2   Ascorbic Acid (VITAMIN C) 1000 MG tablet Take 1,000 mg by mouth daily.     aspirin 81 MG tablet Take 81 mg by mouth daily.     Azelastine-Fluticasone 137-50 MCG/ACT SUSP Place 2 sprays into both nostrils in the morning and at bedtime. 23 g 12   Cholecalciferol (VITAMIN D3) 1000 UNITS CAPS Take by mouth daily.     FLOVENT HFA 110 MCG/ACT inhaler Inhale 2 puffs into the lungs in the morning and at bedtime. During Spring season and/or respiratory illness/asthma flares until symptoms resolve. 1 each 12   folic acid (FOLVITE) 1 MG tablet Take 1 mg by mouth daily.     levothyroxine (SYNTHROID) 50 MCG tablet TAKE 2 TABLETS BY MOUTH DAILY 180 tablet 1   Multiple Vitamin (MULTIVITAMIN) tablet Take by mouth.     pravastatin (PRAVACHOL) 20 MG tablet TAKE 1 TABLET BY MOUTH EVERY DAY 90  tablet 1   No current facility-administered medications on file prior to visit.    Review of Systems  Constitutional:  Positive for fever.  Musculoskeletal:  Positive for arthralgias.  Neurological:  Positive for weakness (upper arms - not hands). Negative for numbness.       Objective:   Vitals:   12/17/21 1412  BP: 128/80  Pulse: 78  Temp: 98 F (36.7 C)  SpO2: 96%   BP Readings from Last 3 Encounters:  12/17/21 128/80  10/27/21 130/78  10/22/21 (!) 146/84   Wt Readings from Last 3 Encounters:  12/17/21 153 lb (69.4 kg)  10/27/21 150 lb (68 kg)  10/22/21 150 lb 12.8 oz (68.4 kg)   Body mass index is 27.1 kg/m.    Physical Exam    A bilateral shoulder exam was performed.   SWELLING: Neither shoulder EFFUSION: no  WARMTH: no warmth  TENDERNESS: Tenderness with palpation anterior right shoulder, no left shoulder tenderness with palpation ROM: full ROM but with significant pain in right shoulder.  Better range of motion and much less pain in left shoulder NEUROLOGICAL EXAM: normal sensation and strength bilateral hands.  Some weakness bilateral shoulder secondary to pain  PULSES:  normal       Assessment & Plan:    See Problem List for Assessment and Plan of chronic medical problems.

## 2021-12-17 ENCOUNTER — Ambulatory Visit: Payer: Medicare PPO | Admitting: Internal Medicine

## 2021-12-17 VITALS — BP 128/80 | HR 78 | Temp 98.0°F | Ht 63.0 in | Wt 153.0 lb

## 2021-12-17 DIAGNOSIS — M7552 Bursitis of left shoulder: Secondary | ICD-10-CM | POA: Diagnosis not present

## 2021-12-17 DIAGNOSIS — M7551 Bursitis of right shoulder: Secondary | ICD-10-CM | POA: Diagnosis not present

## 2021-12-17 IMAGING — MG DIGITAL SCREENING BILAT W/ TOMO W/ CAD
8 series · 8 of 24 positions shown · non-contrast
Comparison: Previous exam(s).

CLINICAL DATA: Screening.

EXAM:
DIGITAL SCREENING BILATERAL MAMMOGRAM WITH TOMO AND CAD

[L MLO synth-2D]
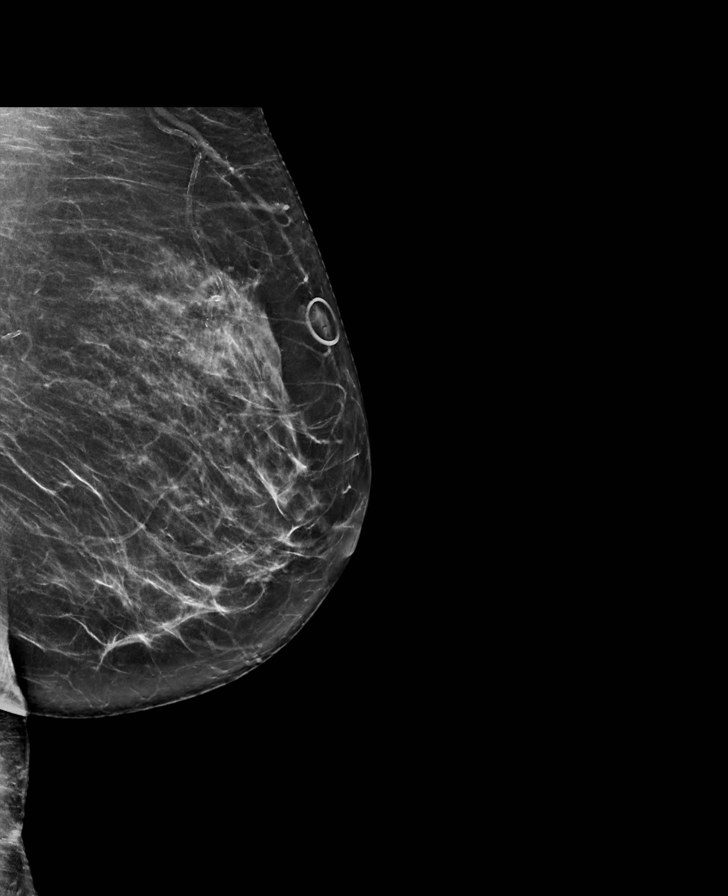

[L CC synth-2D]
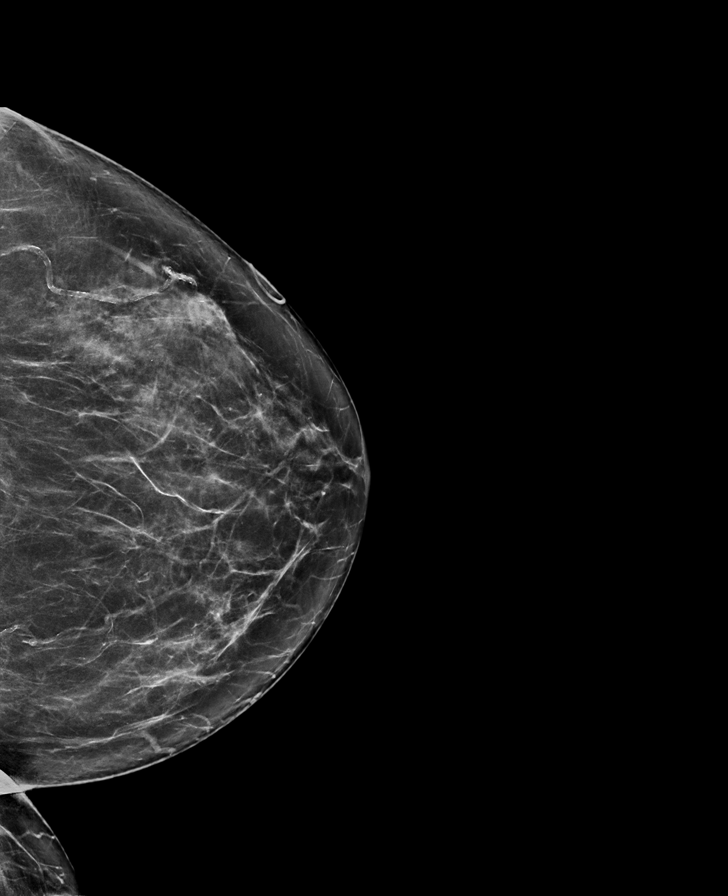

[R CC synth-2D]
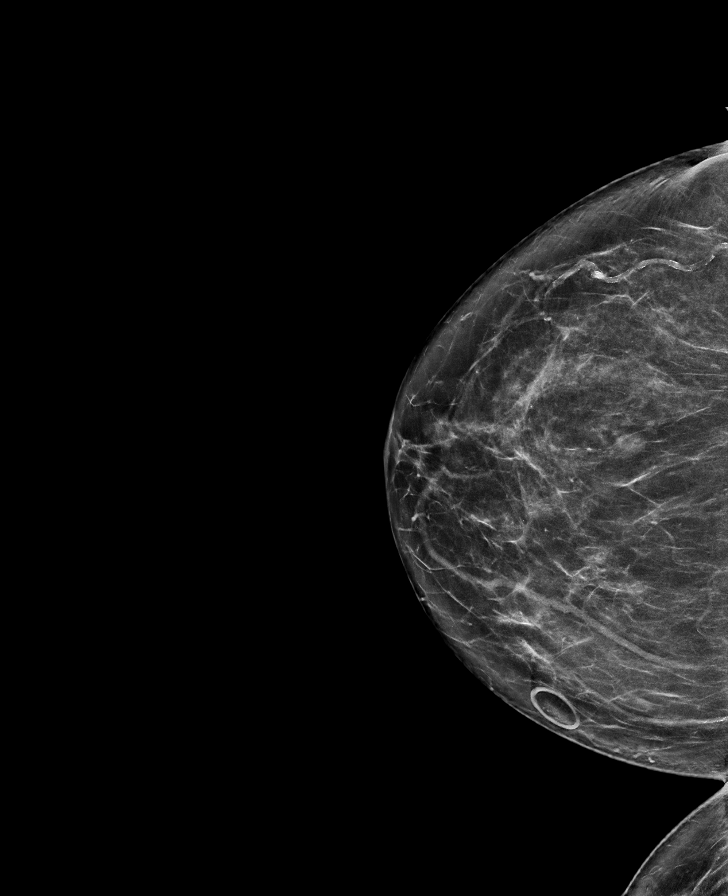

[R MLO synth-2D]
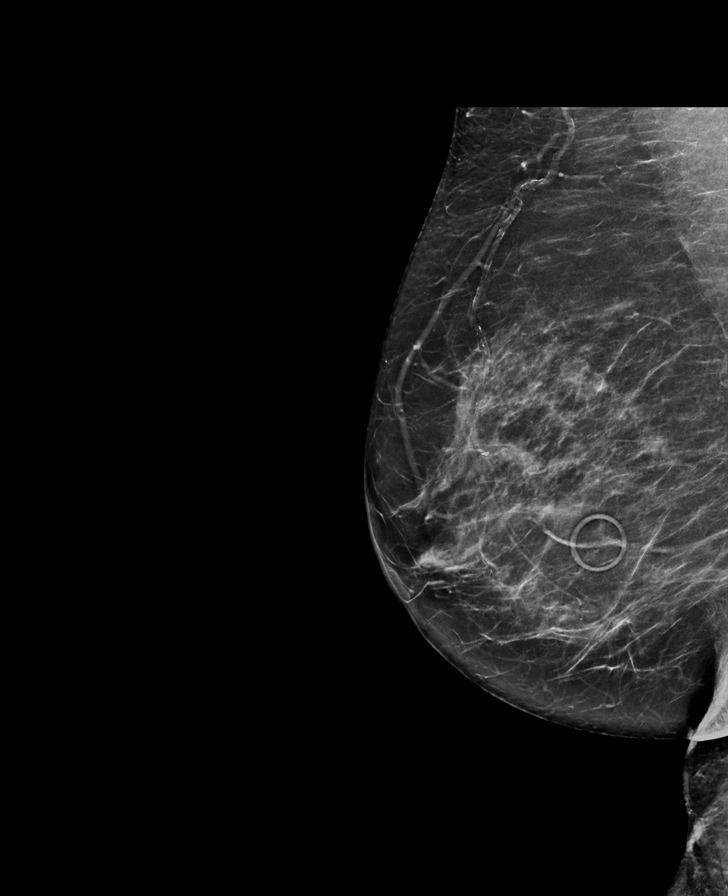

[R CC tomo · tomo slice 37/74.0]
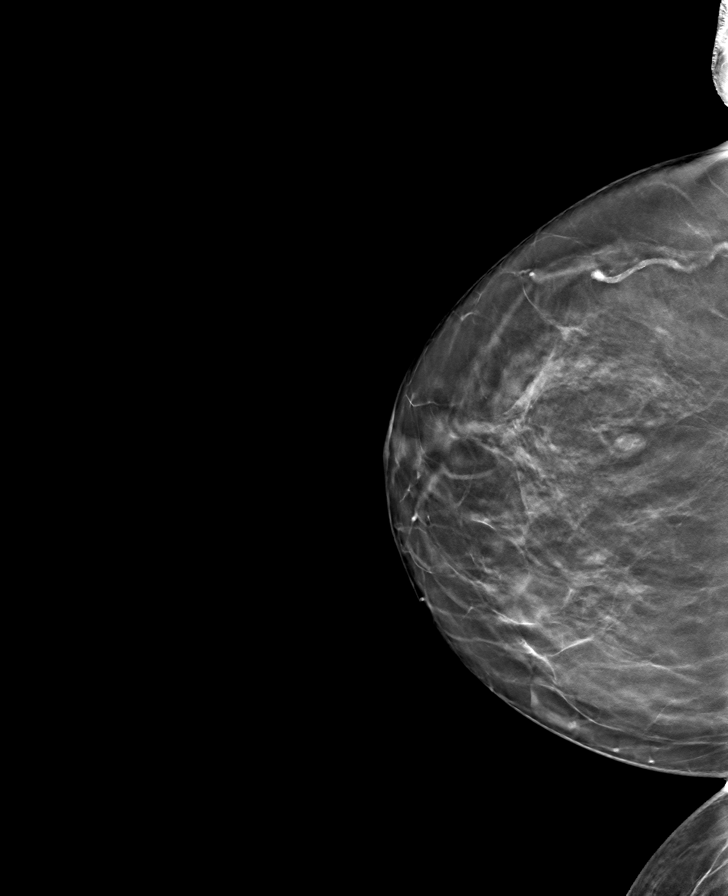

[L MLO tomo · tomo slice 37/73.0]
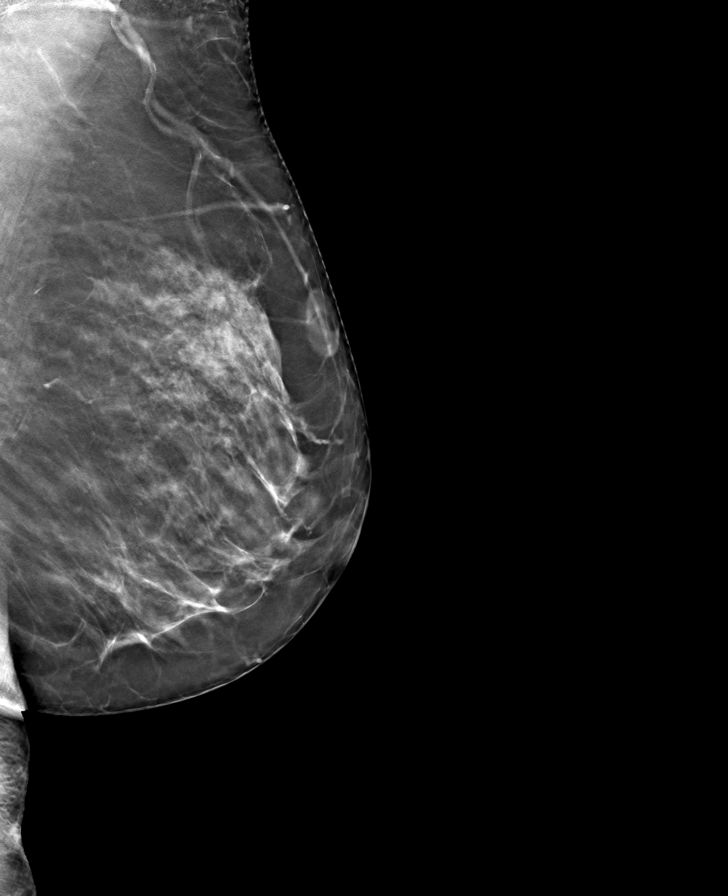

[R MLO tomo · tomo slice 38/75.0]
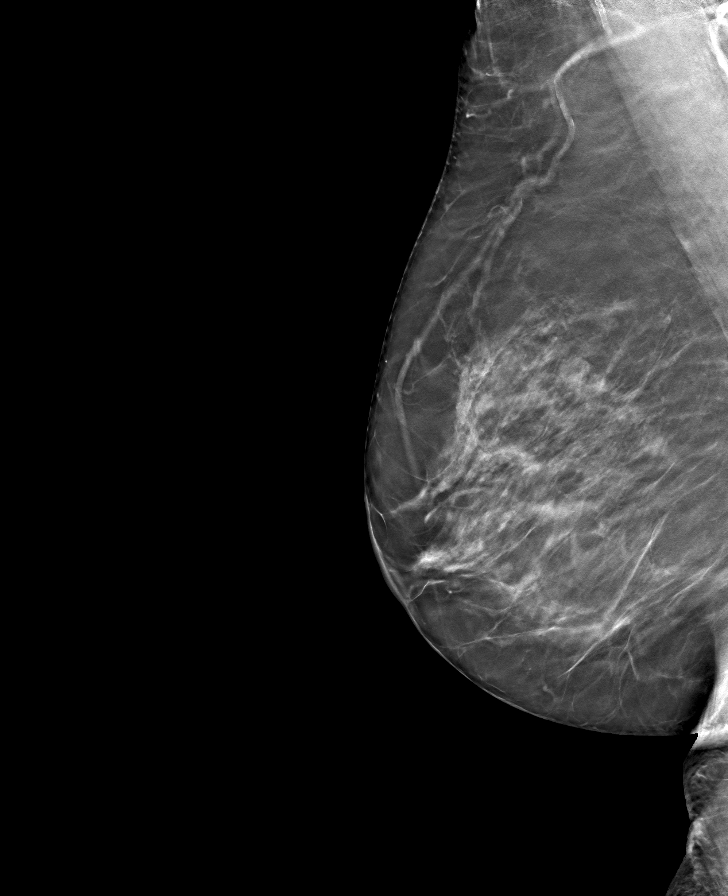

[L CC tomo · tomo slice 37/74.0]
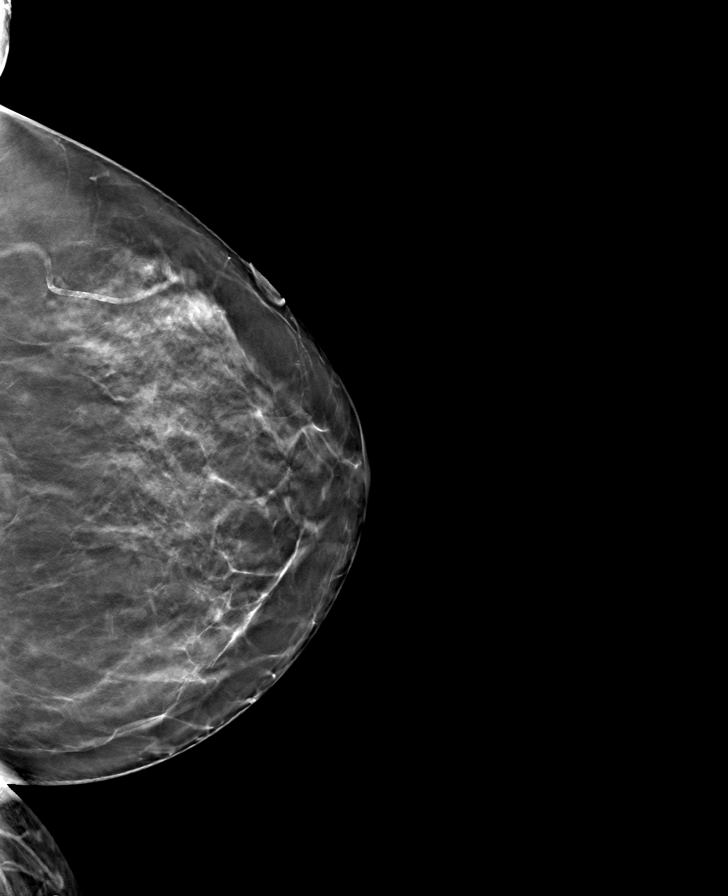

[8 of 24 positions shown; findings below may reference images not displayed]

ACR Breast Density Category b: There are scattered areas of
fibroglandular density.
FINDINGS: There are no findings suspicious for malignancy. Images were
processed with CAD.
IMPRESSION: No mammographic evidence of malignancy. A result letter of this
screening mammogram will be mailed directly to the patient.

RECOMMENDATION:
Screening mammogram in one year. (Code:[id number])

BI-RADS CATEGORY  1: Negative.

## 2021-12-17 MED ORDER — MELOXICAM 15 MG PO TABS: 15.0000 mg | ORAL_TABLET | Freq: Every day | ORAL | 0 refills | Status: DC

## 2021-12-17 NOTE — Assessment & Plan Note (Signed)
Acute Right shoulder-much worse than left shoulder Saw orthopedics in the past few months and had a cortisone injection which really helped-discussed that this most likely has worn off and this is a recurrence Discussed treatment of NSAIDs, icing, elevation and activities, topical medication Start meloxicam 50 mg daily-take with food and do not take any other NSAIDs while taking this Can ice, use topical pain medications Continue to advise activities Advise follow-up with orthopedics-May need another steroid injection

## 2021-12-17 NOTE — Patient Instructions (Addendum)
      Medications changes include :   meloxicam 15 mg daily - take with food - no other advil/aleve while taking this   Your prescription(s) have been sent to your pharmacy.    Follow up with Orthopedics.     Return if symptoms worsen or fail to improve.

## 2021-12-20 ENCOUNTER — Ambulatory Visit (INDEPENDENT_AMBULATORY_CARE_PROVIDER_SITE_OTHER): Payer: Medicare PPO

## 2021-12-20 DIAGNOSIS — Z0000 Encounter for general adult medical examination without abnormal findings: Secondary | ICD-10-CM | POA: Diagnosis not present

## 2021-12-20 DIAGNOSIS — M25511 Pain in right shoulder: Secondary | ICD-10-CM | POA: Diagnosis not present

## 2021-12-20 NOTE — Progress Notes (Signed)
Subjective:   Pamela Rich is a 76 y.o. female who presents for Medicare Annual (Subsequent) preventive examination.   I connected with Pamela Rich today by telephone and verified that I am speaking with the correct person using two identifiers. Location patient: home Location provider: work Persons participating in the virtual visit: patient, provider.   I discussed the limitations, risks, security and privacy concerns of performing an evaluation and management service by telephone and the availability of in person appointments. I also discussed with the patient that there may be a patient responsible charge related to this service. The patient expressed understanding and verbally consented to this telephonic visit.    Interactive audio and video telecommunications were attempted between this provider and patient, however failed, due to patient having technical difficulties OR patient did not have access to video capability.  We continued and completed visit with audio only.    Review of Systems     Cardiac Risk Factors include: advanced age (>15mn, >>73women)     Objective:    Today's Vitals   There is no height or weight on file to calculate BMI.     12/20/2021    3:03 PM 12/09/2020    1:58 PM 01/07/2020   10:22 AM 10/14/2019   12:23 PM 08/30/2018    1:26 PM 03/20/2018    7:21 AM 03/12/2018    3:52 PM  Advanced Directives  Does Patient Have a Medical Advance Directive? Yes Yes Yes Yes Yes Yes Yes  Type of AParamedicof APetalLiving will HHallsvilleLiving will HWanakahLiving will HSequatchieLiving will HBig PointLiving will HCarolina BeachLiving will HBiggsLiving will  Does patient want to make changes to medical advance directive?  No - Patient declined No - Patient declined No - Patient declined   No - Patient declined  Copy of  H[place]in Chart? No - copy requested No - copy requested Yes - validated most recent copy scanned in chart (See row information)  No - copy requested No - copy requested No - copy requested    Current Medications (verified) Outpatient Encounter Medications as of 12/20/2021  Medication Sig   albuterol (PROAIR HFA) 108 (90 Base) MCG/ACT inhaler INHALE 2 PUFFS EVERY 4 HOURS AS NEEDED FOR WHEEZING OR COUGHING SPELLS   Ascorbic Acid (VITAMIN C) 1000 MG tablet Take 1,000 mg by mouth daily.   aspirin 81 MG tablet Take 81 mg by mouth daily.   Azelastine-Fluticasone 137-50 MCG/ACT SUSP Place 2 sprays into both nostrils in the morning and at bedtime.   Cholecalciferol (VITAMIN D3) 1000 UNITS CAPS Take by mouth daily.   FLOVENT HFA 110 MCG/ACT inhaler Inhale 2 puffs into the lungs in the morning and at bedtime. During Spring season and/or respiratory illness/asthma flares until symptoms resolve.   folic acid (FOLVITE) 1 MG tablet Take 1 mg by mouth daily.   levothyroxine (SYNTHROID) 50 MCG tablet TAKE 2 TABLETS BY MOUTH DAILY   meloxicam (MOBIC) 15 MG tablet Take 1 tablet (15 mg total) by mouth daily. Take with food   Multiple Vitamin (MULTIVITAMIN) tablet Take by mouth.   pravastatin (PRAVACHOL) 20 MG tablet TAKE 1 TABLET BY MOUTH EVERY DAY   No facility-administered encounter medications on file as of 12/20/2021.    Allergies (verified) Augmentin [amoxicillin-pot clavulanate] and Lexapro [escitalopram]   History: Past Medical History:  Diagnosis Date   Allergy  SEASONAL   Fibroid    no surgery   Heart murmur    Hyperlipidemia    Hypothyroidism    Migraines    Mild intermittent asthma 07/20/2015   pt is currently not taking any medications   Osteopenia    Sciatic nerve lesion 2018   Vitamin D deficiency    Past Surgical History:  Procedure Laterality Date   DENTAL SURGERY     DORSAL COMPARTMENT RELEASE Right 03/20/2018   Procedure: RIGHT OPEN  RELEASE DORSAL  COMPARTMENT (DEQUERVAINS);  Surgeon: Daryll Brod, MD;  Location: [facility];  Service: Orthopedics;  Laterality: Right;   HYSTEROSCOPY WITH D & C     LUMBAR FUSION     Dr Hal Neer   SEPTOPLASTY     SINOSCOPY     TUBAL LIGATION     WRIST ARTHROSCOPY WITH FOVEAL TRIANGULAR FIBROCARTILAGE COMPLEX REPAIR Right 03/20/2018   Procedure: RIGHT WRIST ARTHROSCOPY WITH DEBRIDEMENT, SCAPHOLUNATE/TRIANGULAR FIBROCARTILAGE COMPLEX REPAIR;  Surgeon: Daryll Brod, MD;  Location: [facility];  Service: Orthopedics;  Laterality: Right;   Family History  Problem Relation Age of Onset   Hypertension Mother    Heart disease Mother        CHF; AS   Osteoporosis Mother    Heart attack Mother 7   Allergic rhinitis Mother    Diabetes Father    Stroke Father        onset in 54s   Cancer Brother        malignant brain tumor   Colon cancer Neg Hx    Esophageal cancer Neg Hx    Rectal cancer Neg Hx    Stomach cancer Neg Hx    Angioedema Neg Hx    Asthma Neg Hx    Eczema Neg Hx    Immunodeficiency Neg Hx    Urticaria Neg Hx    Breast cancer Neg Hx    Social History   Socioeconomic History   Marital status: Widowed    Spouse name: Not on file   Number of children: 3   Years of education: Not on file   Highest education level: Not on file  Occupational History   Occupation: retired  Tobacco Use   Smoking status: Former    Packs/day: 0.30    Years: 40.00    Total pack years: 12.00    Types: Cigarettes    Quit date: 07/04/1974    Years since quitting: 47.4   Smokeless tobacco: Never   Tobacco comments:    smoked 1965-1976 , < 3 cig/ day  Vaping Use   Vaping Use: Never used  Substance and Sexual Activity   Alcohol use: No    Alcohol/week: 0.0 standard drinks of alcohol    Comment: rarely   Drug use: No   Sexual activity: Not Currently    Partners: Male    Birth control/protection: Surgical, Post-menopausal    Comment: BTL  Other Topics Concern   Not on file   Social History Narrative   Exercise: walking, stretching, weights   Social Determinants of Health   Financial Resource Strain: Low Risk  (12/20/2021)   Overall Financial Resource Strain (CARDIA)    Difficulty of Paying Living Expenses: Not hard at all  Food Insecurity: No Food Insecurity (12/20/2021)   Hunger Vital Sign    Worried About Running Out of Food in the Last Year: Never true    Ran Out of Food in the Last Year: Never true  Transportation Needs: No Transportation Needs (  12/20/2021)   PRAPARE - Hydrologist (Medical): No    Lack of Transportation (Non-Medical): No  Physical Activity: Insufficiently Active (12/20/2021)   Exercise Vital Sign    Days of Exercise per Week: 3 days    Minutes of Exercise per Session: 30 min  Stress: No Stress Concern Present (12/20/2021)   [place]    Feeling of Stress : Not at all  Social Connections: Moderately Integrated (12/20/2021)   Social Connection and Isolation Panel [NHANES]    Frequency of Communication with Friends and Family: Three times a week    Frequency of Social Gatherings with Friends and Family: Three times a week    Attends Religious Services: More than 4 times per year    Active Member of Clubs or Organizations: Yes    Attends Archivist Meetings: More than 4 times per year    Marital Status: Widowed    Tobacco Counseling Counseling given: Not Answered Tobacco comments: smoked 1965-1976 , < 3 cig/ day   Clinical Intake:  Pre-visit preparation completed: Yes  Pain : No/denies pain     Nutritional Risks: None Diabetes: No  How often do you need to have someone help you when you read instructions, pamphlets, or other written materials from your doctor or pharmacy?: 1 - Never What is the last grade level you completed in school?: college  Diabetic?no   Interpreter Needed?: No  Information entered by ::  [employer] of Daily Living    12/20/2021    3:04 PM  In your present state of health, do you have any difficulty performing the following activities:  Hearing? 0  Vision? 0  Difficulty concentrating or making decisions? 0  Walking or climbing stairs? 0  Dressing or bathing? 0  Doing errands, shopping? 0  Preparing Food and eating ? N  Using the Toilet? N  In the past six months, have you accidently leaked urine? N  Do you have problems with loss of bowel control? N  Managing your Medications? N  Managing your Finances? N  Housekeeping or managing your Housekeeping? N    Patient Care Team: Binnie Rail, MD as PCP - General (Internal Medicine) Bobbitt, Sed[place] Muta, MD as Consulting Physician (Allergy and Immunology) Megan Salon, MD as Consulting Physician (Gynecology) Danella Sensing, MD as Consulting Physician (Dermatology) Marygrace Drought, MD as Consulting Physician (Ophthalmology)  Indicate any recent Medical Services you may have received from other than [facility] providers in the past year (date may be approximate).     Assessment:   This is a routine wellness examination for [place].  Hearing/Vision screen Vision Screening - Comments:: Annual eye exams wears glasses/contacts   Dietary issues and exercise activities discussed: Current Exercise Habits: Home exercise routine, Type of exercise: walking, Time (Minutes): 30, Frequency (Times/Week): 3, Weekly Exercise (Minutes/Week): 90, Intensity: Mild, Exercise limited by: orthopedic condition(s)   Goals Addressed   None    Depression Screen    12/20/2021    3:04 PM 12/20/2021    3:02 PM 12/17/2021    2:22 PM 08/16/2021    1:20 PM 12/09/2020    1:56 PM 10/14/2019   12:25 PM 08/30/2018    1:27 PM  PHQ 2/9 Scores  PHQ - 2 Score 0 0 0 0 0 0 0  PHQ- 9 Score   1        Fall Risk    12/20/2021  3:04 PM 12/17/2021    2:21 PM 08/16/2021    1:20 PM 12/09/2020    1:58 PM 10/14/2019   12:24 PM  Fall Risk    Falls in the past year? 0 0 0 0 0  Number falls in past yr: 0 0 0 0 0  Injury with Fall? 0 0 0 0 0  Risk for fall due to :  No Fall Risks No Fall Risks No Fall Risks No Fall Risks  Follow up Falls evaluation completed Falls evaluation completed Falls evaluation completed Falls evaluation completed Falls evaluation completed;Education provided;Falls prevention discussed    FALL RISK PREVENTION PERTAINING TO THE HOME:  Any stairs in or around the home? Yes  If so, are there any without handrails? No  Home free of loose throw rugs in walkways, pet beds, electrical cords, etc? Yes  Adequate lighting in your home to reduce risk of falls? Yes   ASSISTIVE DEVICES UTILIZED TO PREVENT FALLS:  Life alert? No  Use of a cane, walker or w/c? No  Grab bars in the bathroom? Yes  Shower chair or bench in shower? No  Elevated toilet seat or a handicapped toilet? No    Cognitive Function:  Normal cognitive status assessed by telephone conversation  by this Nurse Health Advisor. No abnormalities found.        10/14/2019   12:29 PM  6CIT Screen  What Year? 0 points  What month? 0 points  What time? 0 points  Count back from 20 0 points  Months in reverse 0 points  Repeat phrase 0 points  Total Score 0 points    Immunizations Immunization History  Administered Date(s) Administered   Fluad Quad(high Dose 65+) 03/14/2019   Influenza Split 04/06/2011, 04/18/2012   Influenza Whole 04/18/2007, 04/23/2008, 04/08/2009, 03/26/2010   Influenza, High Dose Seasonal PF 04/16/2013, 03/24/2016, 04/04/2017, 04/13/2018, 03/16/2020, 03/22/2021   Influenza,inj,Quad PF,6+ Mos 03/18/2014, 03/24/2015   PFIZER Comirnaty(Gray Top)Covid-19 Tri-Sucrose Vaccine 10/19/2020   PFIZER(Purple Top)SARS-COV-2 Vaccination 08/10/2019, 08/31/2019, 03/23/2020   Pfizer Covid-19 Vaccine Bivalent Booster 22yr & up 03/25/2021   Pneumococcal Conjugate-13 11/04/2014   Pneumococcal Polysaccharide-23 01/22/2007, 11/12/2012    Tdap 07/26/2012   Zoster, Live 09/24/2007    TDAP status: Up to date  Flu Vaccine status: Up to date  Pneumococcal vaccine status: Up to date  Covid-19 vaccine status: Completed vaccines  Qualifies for Shingles Vaccine? Yes   Zostavax completed No   Shingrix Completed?: No.    Education has been provided regarding the importance of this vaccine. Patient has been advised to call insurance company to determine out of pocket expense if they have not yet received this vaccine. Advised may also receive vaccine at local pharmacy or Health Dept. Verbalized acceptance and understanding.  Screening Tests Health Maintenance  Topic Date Due   Zoster Vaccines- Shingrix (1 of 2) Never done   COVID-19 Vaccine (6 - Pfizer series) 07/25/2021   INFLUENZA VACCINE  02/01/2022   TETANUS/TDAP  07/26/2022   DEXA SCAN  04/17/2023   Pneumonia Vaccine 76 Years old  Completed   Hepatitis C Screening  Completed   HPV VACCINES  Aged Out   COLONOSCOPY (Pts 45-443yrInsurance coverage will need to be confirmed)  Discontinued    Health Maintenance  Health Maintenance Due  Topic Date Due   Zoster Vaccines- Shingrix (1 of 2) Never done   COVID-19 Vaccine (6 - PfBurnsideeries) 07/25/2021    Colorectal cancer screening: No longer required.   Mammogram status: No longer required  due to age.  Bone Density status: Completed 04/16/2020. Results reflect: Bone density results: OSTEOPENIA. Repeat every 3 years.  Lung Cancer Screening: (Low Dose CT Chest recommended if Age 7-80 years, 30 pack-year currently smoking OR have quit w/in 15years.) does not qualify.   Lung Cancer Screening Referral: n/a  Additional Screening:  Hepatitis C Screening: does not qualify;   Vision Screening: Recommended annual ophthalmology exams for early detection of glaucoma and other disorders of the eye. Is the patient up to date with their annual eye exam?  Yes  Who is the provider or what is the name of the office in which  the patient attends annual eye exams? [facility]  If pt is not established with a provider, would they like to be referred to a provider to establish care? No .   Dental Screening: Recommended annual dental exams for proper oral hygiene  Community Resource Referral / Chronic Care Management: CRR required this visit?  No   CCM required this visit?  No      Plan:     I have personally reviewed and noted the following in the patient's chart:   Medical and social history Use of alcohol, tobacco or illicit drugs  Current medications and supplements including opioid prescriptions.  Functional ability and status Nutritional status Physical activity Advanced directives List of other physicians Hospitalizations, surgeries, and ER visits in previous 12 months Vitals Screenings to include cognitive, depression, and falls Referrals and appointments  In addition, I have reviewed and discussed with patient certain preventive protocols, quality metrics, and best practice recommendations. A written personalized care plan for preventive services as well as general preventive health recommendations were provided to patient.     Randel Pigg, LPN   7/67/3419   Nurse Notes: none

## 2021-12-20 NOTE — Patient Instructions (Signed)
Pamela Rich , Thank you for taking time to come for your Medicare Wellness Visit. I appreciate your ongoing commitment to your health goals. Please review the following plan we discussed and let me know if I can assist you in the future.   Screening recommendations/referrals: Colonoscopy: no longer required  Mammogram: no longer required  Bone Density: 04/16/2020 Recommended yearly ophthalmology/optometry visit for glaucoma screening and checkup Recommended yearly dental visit for hygiene and checkup  Vaccinations: Influenza vaccine: completed  Pneumococcal vaccine: completed  Tdap vaccine: 07/26/2022 Shingles vaccine: will consider     Advanced directives: yes   Conditions/risks identified: none   Next appointment: none    Preventive Care 76 Years and Older, Female Preventive care refers to lifestyle choices and visits with your health care provider that can promote health and wellness. What does preventive care include? A yearly physical exam. This is also called an annual well check. Dental exams once or twice a year. Routine eye exams. Ask your health care provider how often you should have your eyes checked. Personal lifestyle choices, including: Daily care of your teeth and gums. Regular physical activity. Eating a healthy diet. Avoiding tobacco and drug use. Limiting alcohol use. Practicing safe sex. Taking low-dose aspirin every day. Taking vitamin and mineral supplements as recommended by your health care provider. What happens during an annual well check? The services and screenings done by your health care provider during your annual well check will depend on your age, overall health, lifestyle risk factors, and family history of disease. Counseling  Your health care provider may ask you questions about your: Alcohol use. Tobacco use. Drug use. Emotional well-being. Home and relationship well-being. Sexual activity. Eating habits. History of falls. Memory  and ability to understand (cognition). Work and work Statistician. Reproductive health. Screening  You may have the following tests or measurements: Height, weight, and BMI. Blood pressure. Lipid and cholesterol levels. These may be checked every 5 years, or more frequently if you are over 65 years old. Skin check. Lung cancer screening. You may have this screening every year starting at age 21 if you have a 30-pack-year history of smoking and currently smoke or have quit within the past 15 years. Fecal occult blood test (FOBT) of the stool. You may have this test every year starting at age 20. Flexible sigmoidoscopy or colonoscopy. You may have a sigmoidoscopy every 5 years or a colonoscopy every 10 years starting at age 76. Hepatitis C blood test. Hepatitis B blood test. Sexually transmitted disease (STD) testing. Diabetes screening. This is done by checking your blood sugar (glucose) after you have not eaten for a while (fasting). You may have this done every 1-3 years. Bone density scan. This is done to screen for osteoporosis. You may have this done starting at age 41. Mammogram. This may be done every 1-2 years. Talk to your health care provider about how often you should have regular mammograms. Talk with your health care provider about your test results, treatment options, and if necessary, the need for more tests. Vaccines  Your health care provider may recommend certain vaccines, such as: Influenza vaccine. This is recommended every year. Tetanus, diphtheria, and acellular pertussis (Tdap, Td) vaccine. You may need a Td booster every 10 years. Zoster vaccine. You may need this after age 52. Pneumococcal 13-valent conjugate (PCV13) vaccine. One dose is recommended after age 29. Pneumococcal polysaccharide (PPSV23) vaccine. One dose is recommended after age 66. Talk to your health care provider about which screenings and  vaccines you need and how often you need them. This  information is not intended to replace advice given to you by your health care provider. Make sure you discuss any questions you have with your health care provider. Document Released: 07/17/2015 Document Revised: 03/09/2016 Document Reviewed: 04/21/2015 Elsevier Interactive Patient Education  2017 [employer] Prevention in the Home Falls can cause injuries. They can happen to people of all ages. There are many things you can do to make your home safe and to help prevent falls. What can I do on the outside of my home? Regularly fix the edges of walkways and driveways and fix any cracks. Remove anything that might make you trip as you walk through a door, such as a raised step or threshold. Trim any bushes or trees on the path to your home. Use bright outdoor lighting. Clear any walking paths of anything that might make someone trip, such as rocks or tools. Regularly check to see if handrails are loose or broken. Make sure that both sides of any steps have handrails. Any raised decks and porches should have guardrails on the edges. Have any leaves, snow, or ice cleared regularly. Use sand or salt on walking paths during winter. Clean up any spills in your garage right away. This includes oil or grease spills. What can I do in the bathroom? Use night lights. Install grab bars by the toilet and in the tub and shower. Do not use towel bars as grab bars. Use non-skid mats or decals in the tub or shower. If you need to sit down in the shower, use a plastic, non-slip stool. Keep the floor dry. Clean up any water that spills on the floor as soon as it happens. Remove soap buildup in the tub or shower regularly. Attach bath mats securely with double-sided non-slip rug tape. Do not have throw rugs and other things on the floor that can make you trip. What can I do in the bedroom? Use night lights. Make sure that you have a light by your bed that is easy to reach. Do not use any sheets or  blankets that are too big for your bed. They should not hang down onto the floor. Have a firm chair that has side arms. You can use this for support while you get dressed. Do not have throw rugs and other things on the floor that can make you trip. What can I do in the kitchen? Clean up any spills right away. Avoid walking on wet floors. Keep items that you use a lot in easy-to-reach places. If you need to reach something above you, use a strong step stool that has a grab bar. Keep electrical cords out of the way. Do not use floor polish or wax that makes floors slippery. If you must use wax, use non-skid floor wax. Do not have throw rugs and other things on the floor that can make you trip. What can I do with my stairs? Do not leave any items on the stairs. Make sure that there are handrails on both sides of the stairs and use them. Fix handrails that are broken or loose. Make sure that handrails are as long as the stairways. Check any carpeting to make sure that it is firmly attached to the stairs. Fix any carpet that is loose or worn. Avoid having throw rugs at the top or bottom of the stairs. If you do have throw rugs, attach them to the floor with carpet tape. Make  sure that you have a light switch at the top of the stairs and the bottom of the stairs. If you do not have them, ask someone to add them for you. What else can I do to help prevent falls? Wear shoes that: Do not have high heels. Have rubber bottoms. Are comfortable and fit you well. Are closed at the toe. Do not wear sandals. If you use a stepladder: Make sure that it is fully opened. Do not climb a closed stepladder. Make sure that both sides of the stepladder are locked into place. Ask someone to hold it for you, if possible. Clearly mark and make sure that you can see: Any grab bars or handrails. First and last steps. Where the edge of each step is. Use tools that help you move around (mobility aids) if they are  needed. These include: Canes. Walkers. Scooters. Crutches. Turn on the lights when you go into a dark area. Replace any light bulbs as soon as they burn out. Set up your furniture so you have a clear path. Avoid moving your furniture around. If any of your floors are uneven, fix them. If there are any pets around you, be aware of where they are. Review your medicines with your doctor. Some medicines can make you feel dizzy. This can increase your chance of falling. Ask your doctor what other things that you can do to help prevent falls. This information is not intended to replace advice given to you by your health care provider. Make sure you discuss any questions you have with your health care provider. Document Released: 04/16/2009 Document Revised: 11/26/2015 Document Reviewed: 07/25/2014 Elsevier Interactive Patient Education  2017 [employer].

## 2022-01-12 ENCOUNTER — Other Ambulatory Visit: Payer: Self-pay | Admitting: Urology

## 2022-01-12 DIAGNOSIS — N281 Cyst of kidney, acquired: Secondary | ICD-10-CM

## 2022-02-04 ENCOUNTER — Other Ambulatory Visit: Payer: Self-pay | Admitting: Internal Medicine

## 2022-02-18 ENCOUNTER — Other Ambulatory Visit: Payer: Self-pay | Admitting: Internal Medicine

## 2022-02-28 ENCOUNTER — Ambulatory Visit
Admission: RE | Admit: 2022-02-28 | Discharge: 2022-02-28 | Disposition: A | Discharge status: Home / Self Care | Payer: Medicare PPO | Source: Ambulatory Visit | Attending: Urology | Admitting: Urology

## 2022-02-28 DIAGNOSIS — K820 Obstruction of gallbladder: Secondary | ICD-10-CM | POA: Diagnosis not present

## 2022-02-28 DIAGNOSIS — N281 Cyst of kidney, acquired: Secondary | ICD-10-CM

## 2022-02-28 DIAGNOSIS — N289 Disorder of kidney and ureter, unspecified: Secondary | ICD-10-CM | POA: Diagnosis not present

## 2022-02-28 MED ORDER — GADOBENATE DIMEGLUMINE 529 MG/ML IV SOLN
15.0000 mL | Freq: Once | INTRAVENOUS | Status: AC | PRN
  Administered 2022-02-28: 15 mL via INTRAVENOUS

## 2022-03-02 DIAGNOSIS — D2271 Melanocytic nevi of right lower limb, including hip: Secondary | ICD-10-CM | POA: Diagnosis not present

## 2022-03-02 DIAGNOSIS — L821 Other seborrheic keratosis: Secondary | ICD-10-CM | POA: Diagnosis not present

## 2022-03-02 DIAGNOSIS — D225 Melanocytic nevi of trunk: Secondary | ICD-10-CM | POA: Diagnosis not present

## 2022-03-02 DIAGNOSIS — L812 Freckles: Secondary | ICD-10-CM | POA: Diagnosis not present

## 2022-03-08 DIAGNOSIS — N281 Cyst of kidney, acquired: Secondary | ICD-10-CM | POA: Diagnosis not present

## 2022-03-28 DIAGNOSIS — M25512 Pain in left shoulder: Secondary | ICD-10-CM | POA: Diagnosis not present

## 2022-04-08 DIAGNOSIS — M25512 Pain in left shoulder: Secondary | ICD-10-CM | POA: Diagnosis not present

## 2022-04-15 DIAGNOSIS — M25512 Pain in left shoulder: Secondary | ICD-10-CM | POA: Diagnosis not present

## 2022-05-05 ENCOUNTER — Telehealth: Payer: Self-pay | Admitting: Internal Medicine

## 2022-05-05 ENCOUNTER — Other Ambulatory Visit: Payer: Self-pay | Admitting: Internal Medicine

## 2022-05-05 MED ORDER — ARNUITY ELLIPTA 100 MCG/ACT IN AEPB: 1.0000 | INHALATION_SPRAY | Freq: Every day | RESPIRATORY_TRACT | 3 refills | Status: DC

## 2022-05-05 NOTE — Telephone Encounter (Signed)
Pamela Rich states she received a letter from [facility] stating they will no longer cover Flovent.  They stated they will cover Arnuity Ellipta in its place. Pamela Rich wants to know if this is a good inhaler or do you recommend something different that will work the same as Barista?  Please advise.

## 2022-05-05 NOTE — Telephone Encounter (Signed)
Hi Nicole-yes this is fine.  It is a different device, so she may need to watch a video online to understand how to use it, or if she needs additional help we have her come in for a nursing visit. I will send in Arnuity 100 mcg 1 puff daily if someone can please let her know. Thanks!

## 2022-05-05 NOTE — Progress Notes (Signed)
Rx placed for arnuity 100 mcg - 1 puff daily per asthma action plan.

## 2022-05-05 NOTE — Telephone Encounter (Signed)
Called and informed patient of Arnuity being sent to pharmacy. Patient verbalized understanding.

## 2022-06-01 ENCOUNTER — Encounter: Payer: Self-pay | Admitting: Family

## 2022-06-01 ENCOUNTER — Other Ambulatory Visit: Payer: Self-pay | Admitting: Obstetrics & Gynecology

## 2022-06-01 DIAGNOSIS — Z1231 Encounter for screening mammogram for malignant neoplasm of breast: Secondary | ICD-10-CM

## 2022-06-02 ENCOUNTER — Ambulatory Visit: Payer: Medicare PPO | Admitting: Internal Medicine

## 2022-06-05 NOTE — Progress Notes (Unsigned)
    Subjective:    Patient ID: Pamela Rich, female    DOB: Aug 28, 1945, 76 y.o.   MRN: [id number]      HPI [place] is here for No chief complaint on file.   She is here for an acute visit for cold symptoms.   Her symptoms started   She is experiencing   She has tried taking       Medications and allergies reviewed with patient and updated if appropriate.  Current Outpatient Medications on File Prior to Visit  Medication Sig Dispense Refill   albuterol (PROAIR HFA) 108 (90 Base) MCG/ACT inhaler INHALE 2 PUFFS EVERY 4 HOURS AS NEEDED FOR WHEEZING OR COUGHING SPELLS 18 g 2   Ascorbic Acid (VITAMIN C) 1000 MG tablet Take 1,000 mg by mouth daily.     aspirin 81 MG tablet Take 81 mg by mouth daily.     Azelastine-Fluticasone 137-50 MCG/ACT SUSP Place 2 sprays into both nostrils in the morning and at bedtime. 23 g 12   Cholecalciferol (VITAMIN D3) 1000 UNITS CAPS Take by mouth daily.     Fluticasone Furoate (ARNUITY ELLIPTA) 100 MCG/ACT AEPB Inhale 1 puff into the lungs daily. Use per asthma action plan. 30 each 3   folic acid (FOLVITE) 1 MG tablet Take 1 mg by mouth daily.     levothyroxine (SYNTHROID) 50 MCG tablet TAKE 2 TABLETS BY MOUTH DAILY 180 tablet 1   meloxicam (MOBIC) 15 MG tablet Take 1 tablet (15 mg total) by mouth daily. Take with food 30 tablet 0   Multiple Vitamin (MULTIVITAMIN) tablet Take by mouth.     pravastatin (PRAVACHOL) 20 MG tablet TAKE 1 TABLET BY MOUTH EVERY DAY 90 tablet 1   No current facility-administered medications on file prior to visit.    Review of Systems     Objective:  There were no vitals filed for this visit. BP Readings from Last 3 Encounters:  12/17/21 128/80  10/27/21 130/78  10/22/21 (!) 146/84   Wt Readings from Last 3 Encounters:  12/17/21 153 lb (69.4 kg)  10/27/21 150 lb (68 kg)  10/22/21 150 lb 12.8 oz (68.4 kg)   There is no height or weight on file to calculate BMI.    Physical Exam         Assessment &  Plan:    See Problem List for Assessment and Plan of chronic medical problems.

## 2022-06-06 ENCOUNTER — Encounter: Payer: Self-pay | Admitting: Internal Medicine

## 2022-06-06 ENCOUNTER — Ambulatory Visit: Payer: Medicare PPO | Admitting: Internal Medicine

## 2022-06-06 VITALS — BP 130/86 | HR 68 | Temp 98.6°F | Ht 63.0 in | Wt 150.0 lb

## 2022-06-06 DIAGNOSIS — J4541 Moderate persistent asthma with (acute) exacerbation: Secondary | ICD-10-CM

## 2022-06-06 DIAGNOSIS — J019 Acute sinusitis, unspecified: Secondary | ICD-10-CM | POA: Insufficient documentation

## 2022-06-06 DIAGNOSIS — J0100 Acute maxillary sinusitis, unspecified: Secondary | ICD-10-CM

## 2022-06-06 MED ORDER — CEFDINIR 300 MG PO CAPS: 300.0000 mg | ORAL_CAPSULE | Freq: Two times a day (BID) | ORAL | 0 refills | Status: DC

## 2022-06-06 MED ORDER — BENZONATATE 200 MG PO CAPS: 200.0000 mg | ORAL_CAPSULE | Freq: Three times a day (TID) | ORAL | 0 refills | Status: DC | PRN

## 2022-06-06 MED ORDER — PREDNISONE 20 MG PO TABS: 40.0000 mg | ORAL_TABLET | Freq: Every day | ORAL | 0 refills | Status: AC

## 2022-06-06 NOTE — Assessment & Plan Note (Signed)
Acute Likely bacterial  Start Omnicef 300 mg BID x 10 day Tessalon Perles 200 mg 3 times daily as needed otc cold medications Rest, fluid Call if no improvement

## 2022-06-06 NOTE — Patient Instructions (Addendum)
      Medications changes include :   omnicef twice daily for 10 days.  Prednisone 40 mg daily x 5 days, tessalon perles for cough    Return if symptoms worsen or fail to improve.

## 2022-06-06 NOTE — Assessment & Plan Note (Signed)
Acute Related to sinus infection Has wheezing at end expiration diffusely Continue albuterol as needed Trying to use new maintenance inhaler-arnuity, but it does not seem to be working as well and she will discuss with her allergist Given wheezing on exam and frequent cough will prescribe prednisone 40 mg daily x 5 days

## 2022-06-10 ENCOUNTER — Telehealth: Payer: Self-pay | Admitting: Internal Medicine

## 2022-06-10 NOTE — Telephone Encounter (Signed)
Yes can try a decongestant to see if that helps

## 2022-06-10 NOTE — Telephone Encounter (Signed)
Pt says none of her symptoms are in her chest, only in her head. Pt says she has been on antibiotics 4 days and can tell it is working but her head feels tight and dull ache. Will a decongestant help? Should she add another medication with the antibiotic?  Please call pt [phone]

## 2022-06-13 NOTE — Telephone Encounter (Signed)
Spoke with patient on Friday and recommended she try Coricin D decongestants since she was worried about it spiking her blood pressure.

## 2022-06-22 ENCOUNTER — Telehealth: Payer: Self-pay | Admitting: Internal Medicine

## 2022-06-22 NOTE — Telephone Encounter (Signed)
Spoke with patient today.  We were able to get her in tomorrow at [facility] for an appointment.

## 2022-06-22 NOTE — Telephone Encounter (Signed)
Patient returned call back and said her covid test came back negative

## 2022-06-22 NOTE — Telephone Encounter (Signed)
Spoke with patient today.  She is having body aches, chills and runny nose. Symptom started yesterday with low grade fever.  She has been advised to take home COVID test and follow up with me to let me know if it is positive or negative so we know how to proceed.  She may need office visit.  Please let me know results when she calls back.

## 2022-06-22 NOTE — Telephone Encounter (Signed)
Pt states that she has finished the antibiotics Dr. Quay Burow gave her on 12.4.23, but started running a fever of 99.00. She states she took Tylenol and it came down to normal but she still isn't feeling very well.   Pt would like a call back from Dr. Quay Burow CMA to know is she should try to get more antibiotics or if she should keep taking a fever reducer and rest.   Please call Pamela Rich at:  [phone]

## 2022-06-23 ENCOUNTER — Ambulatory Visit: Payer: Medicare PPO | Admitting: Family

## 2022-06-23 ENCOUNTER — Encounter: Payer: Self-pay | Admitting: Family

## 2022-06-23 VITALS — BP 155/87 | HR 81 | Temp 97.7°F | Ht 63.0 in | Wt 151.0 lb

## 2022-06-23 DIAGNOSIS — R059 Cough, unspecified: Secondary | ICD-10-CM | POA: Diagnosis not present

## 2022-06-23 LAB — POCT INFLUENZA A/B
Influenza A, POC: NEGATIVE
Influenza B, POC: NEGATIVE

## 2022-06-23 NOTE — Progress Notes (Signed)
Patient ID: EMONII WIENKE, female    DOB: 17-Apr-1946, 76 y.o.   MRN: [id number]  Chief Complaint  Patient presents with   Cough    Pt c/o productive cough with clear mucus, Slight fever of 99.9 up and down, body aches, chills,Nasal congestion and headache, Present since Tuesday. Pt has had a flu shot. Tested negative for Covid yesterday.    HPI:      URI sx:   Pt c/o productive cough with clear mucus, Slight fever of 99.9 up and down, body aches, chills,Nasal congestion, hoarseness, and headache, Present since Tuesday. Pt has had a flu shot. Reports taking Tessalon pearles w/some relief, also Flonase and saline nasal spray. Tested negative for Covid yesterday.   Assessment & Plan:  1. Cough, unspecified type - rapid flu neg. advised pt to continue her OTC nasal sprays, Advil q4h for aches, pain, fever. Increase fluids to 2L per day, use a humidifier overnight.  Call back next week if sx are not improving.   - POCT Influenza A/B  Subjective:    Outpatient Medications Prior to Visit  Medication Sig Dispense Refill   albuterol (PROAIR HFA) 108 (90 Base) MCG/ACT inhaler INHALE 2 PUFFS EVERY 4 HOURS AS NEEDED FOR WHEEZING OR COUGHING SPELLS 18 g 2   Ascorbic Acid (VITAMIN C) 1000 MG tablet Take 1,000 mg by mouth daily.     aspirin 81 MG tablet Take 81 mg by mouth daily.     Azelastine-Fluticasone 137-50 MCG/ACT SUSP Place 2 sprays into both nostrils in the morning and at bedtime. 23 g 12   benzonatate (TESSALON) 200 MG capsule Take 1 capsule (200 mg total) by mouth 3 (three) times daily as needed for cough. 30 capsule 0   Cholecalciferol (VITAMIN D3) 1000 UNITS CAPS Take by mouth daily.     Fluticasone Furoate (ARNUITY ELLIPTA) 100 MCG/ACT AEPB Inhale 1 puff into the lungs daily. Use per asthma action plan. 30 each 3   folic acid (FOLVITE) 1 MG tablet Take 1 mg by mouth daily.     levothyroxine (SYNTHROID) 50 MCG tablet TAKE 2 TABLETS BY MOUTH DAILY 180 tablet 1   Multiple Vitamin  (MULTIVITAMIN) tablet Take by mouth.     pravastatin (PRAVACHOL) 20 MG tablet TAKE 1 TABLET BY MOUTH EVERY DAY 90 tablet 1   cefdinir (OMNICEF) 300 MG capsule Take 1 capsule (300 mg total) by mouth 2 (two) times daily. 20 capsule 0   No facility-administered medications prior to visit.   Past Medical History:  Diagnosis Date   Allergy    SEASONAL   Fibroid    no surgery   Heart murmur    Hyperlipidemia    Hypothyroidism    Migraines    Mild intermittent asthma 07/20/2015   pt is currently not taking any medications   Osteopenia    Sciatic nerve lesion 2018   Vitamin D deficiency    Past Surgical History:  Procedure Laterality Date   DENTAL SURGERY     DORSAL COMPARTMENT RELEASE Right 03/20/2018   Procedure: RIGHT OPEN  RELEASE DORSAL COMPARTMENT (DEQUERVAINS);  Surgeon: Daryll Brod, MD;  Location: [place];  Service: Orthopedics;  Laterality: Right;   HYSTEROSCOPY WITH D & C     LUMBAR FUSION     Dr Hal Neer   SEPTOPLASTY     SINOSCOPY     TUBAL LIGATION     WRIST ARTHROSCOPY WITH FOVEAL TRIANGULAR FIBROCARTILAGE COMPLEX REPAIR Right 03/20/2018   Procedure: RIGHT WRIST ARTHROSCOPY  WITH DEBRIDEMENT, SCAPHOLUNATE/TRIANGULAR FIBROCARTILAGE COMPLEX REPAIR;  Surgeon: Daryll Brod, MD;  Location: [place];  Service: Orthopedics;  Laterality: Right;   Allergies  Allergen Reactions   Augmentin [Amoxicillin-Pot Clavulanate] Diarrhea   Lexapro [Escitalopram] Other (See Comments)    Headache, made her feel sad      Objective:    Physical Exam Vitals and nursing note reviewed.  Constitutional:      Appearance: Normal appearance. She is ill-appearing.     Interventions: Face mask in place.  HENT:     Right Ear: Tympanic membrane and ear canal normal.     Left Ear: Tympanic membrane and ear canal normal.     Nose:     Right Sinus: No frontal sinus tenderness.     Left Sinus: No frontal sinus tenderness.     Mouth/Throat:     Mouth: Mucous  membranes are moist.     Pharynx: Posterior oropharyngeal erythema present. No pharyngeal swelling, oropharyngeal exudate or uvula swelling.     Tonsils: No tonsillar exudate or tonsillar abscesses.  Cardiovascular:     Rate and Rhythm: Normal rate and regular rhythm.  Pulmonary:     Effort: Pulmonary effort is normal.     Breath sounds: Normal breath sounds.  Musculoskeletal:        General: Normal range of motion.  Lymphadenopathy:     Head:     Right side of head: No preauricular or posterior auricular adenopathy.     Left side of head: No preauricular or posterior auricular adenopathy.     Cervical: No cervical adenopathy.  Skin:    General: Skin is warm and dry.  Neurological:     Mental Status: She is alert.  Psychiatric:        Mood and Affect: Mood normal.        Behavior: Behavior normal.    BP (!) 155/87 (BP Location: Left Arm, Patient Position: Sitting, Cuff Size: Large)   Pulse 81   Temp 97.7 F (36.5 C) (Temporal)   Ht '5\' 3"'$  (1.6 m)   Wt 151 lb (68.5 kg)   LMP 07/04/2000   SpO2 98%   BMI 26.75 kg/m  Wt Readings from Last 3 Encounters:  06/23/22 151 lb (68.5 kg)  06/06/22 150 lb (68 kg)  12/17/21 153 lb (69.4 kg)       Jeanie Sewer, NP

## 2022-06-30 DIAGNOSIS — H9201 Otalgia, right ear: Secondary | ICD-10-CM | POA: Diagnosis not present

## 2022-07-27 ENCOUNTER — Ambulatory Visit: Payer: Medicare PPO

## 2022-07-28 ENCOUNTER — Other Ambulatory Visit: Payer: Self-pay | Admitting: Internal Medicine

## 2022-07-29 ENCOUNTER — Ambulatory Visit: Payer: Medicare PPO

## 2022-07-29 ENCOUNTER — Ambulatory Visit
Admission: RE | Admit: 2022-07-29 | Discharge: 2022-07-29 | Disposition: A | Discharge status: Home / Self Care | Payer: Medicare PPO | Source: Ambulatory Visit | Attending: Obstetrics & Gynecology | Admitting: Obstetrics & Gynecology

## 2022-07-29 DIAGNOSIS — Z1231 Encounter for screening mammogram for malignant neoplasm of breast: Secondary | ICD-10-CM

## 2022-08-08 ENCOUNTER — Encounter: Payer: Self-pay | Admitting: Family Medicine

## 2022-08-08 ENCOUNTER — Ambulatory Visit: Payer: Medicare PPO | Admitting: Family Medicine

## 2022-08-08 VITALS — BP 134/70 | HR 68 | Temp 97.9°F | Resp 20 | Ht 63.0 in | Wt 150.0 lb

## 2022-08-08 DIAGNOSIS — R300 Dysuria: Secondary | ICD-10-CM | POA: Diagnosis not present

## 2022-08-08 DIAGNOSIS — N3000 Acute cystitis without hematuria: Secondary | ICD-10-CM | POA: Diagnosis not present

## 2022-08-08 LAB — POCT URINALYSIS DIPSTICK
Bilirubin, UA: NEGATIVE
Blood, UA: NEGATIVE
Glucose, UA: NEGATIVE
Ketones, UA: NEGATIVE
Protein, UA: NEGATIVE
Spec Grav, UA: 1.01 (ref 1.010–1.025)
Urobilinogen, UA: NEGATIVE E.U./dL — AB
pH, UA: 6.5 (ref 5.0–8.0)

## 2022-08-08 MED ORDER — SULFAMETHOXAZOLE-TRIMETHOPRIM 800-160 MG PO TABS: 1.0000 | ORAL_TABLET | Freq: Two times a day (BID) | ORAL | 0 refills | Status: AC

## 2022-08-08 NOTE — Progress Notes (Addendum)
Assessment & Plan:  1. Acute cystitis without hematuria Education provided on UTIs. Encouraged adequate hydration.  Discussed that azithromycin does not treat a UTI, but relieves the symptom of dysuria. Patient did not leave enough urine to send for urine culture. - sulfamethoxazole-trimethoprim (BACTRIM DS) 800-160 MG tablet; Take 1 tablet by mouth 2 (two) times daily for 5 days.  Dispense: 10 tablet; Refill: 0  2. Dysuria - POCT urinalysis dipstick - Urine dipstick shows positive for leukocytes.   Follow up plan: Return in about 4 weeks (around 09/05/2022) for follow-up of chronic medication conditions with PCP.  Hendricks Limes, MSN, APRN, FNP-C  Subjective:  HPI: Pamela Rich is a 77 y.o. female presenting on 08/08/2022 for No chief complaint on file.  Patient complains of foul smelling urine, inability to void, suprapubic pressure, and brainfog  She had symptoms last week, which she thought resolved until they returned this morning.  She has tried taking AZO, increasing her water intake, and drinking cranberry juice.  Patient denies back pain, fever, and stomach ache. Patient does not have a history of recurrent UTI, but has them occasionally.  Patient does not have a history of pyelonephritis.     ROS: Negative unless specifically indicated above in HPI.   Relevant past medical history reviewed and updated as indicated.   Allergies and medications reviewed and updated.   Current Outpatient Medications:    albuterol (PROAIR HFA) 108 (90 Base) MCG/ACT inhaler, INHALE 2 PUFFS EVERY 4 HOURS AS NEEDED FOR WHEEZING OR COUGHING SPELLS, Disp: 18 g, Rfl: 2   Ascorbic Acid (VITAMIN C) 1000 MG tablet, Take 1,000 mg by mouth daily., Disp: , Rfl:    aspirin 81 MG tablet, Take 81 mg by mouth daily., Disp: , Rfl:    Azelastine-Fluticasone 137-50 MCG/ACT SUSP, Place 2 sprays into both nostrils in the morning and at bedtime., Disp: 23 g, Rfl: 12   Cholecalciferol (VITAMIN D3) 1000 UNITS  CAPS, Take by mouth daily., Disp: , Rfl:    Fluticasone Furoate (ARNUITY ELLIPTA) 100 MCG/ACT AEPB, Inhale 1 puff into the lungs daily. Use per asthma action plan., Disp: 30 each, Rfl: 3   folic acid (FOLVITE) 1 MG tablet, Take 1 mg by mouth daily., Disp: , Rfl:    levothyroxine (SYNTHROID) 50 MCG tablet, TAKE 2 TABLETS BY MOUTH DAILY, Disp: 180 tablet, Rfl: 1   Multiple Vitamin (MULTIVITAMIN) tablet, Take by mouth., Disp: , Rfl:    pravastatin (PRAVACHOL) 20 MG tablet, TAKE 1 TABLET BY MOUTH EVERY DAY, Disp: 90 tablet, Rfl: 1   sulfamethoxazole-trimethoprim (BACTRIM DS) 800-160 MG tablet, Take 1 tablet by mouth 2 (two) times daily for 5 days., Disp: 10 tablet, Rfl: 0   benzonatate (TESSALON) 200 MG capsule, Take 1 capsule (200 mg total) by mouth 3 (three) times daily as needed for cough. (Patient not taking: Reported on 08/08/2022), Disp: 30 capsule, Rfl: 0  Allergies  Allergen Reactions   Augmentin [Amoxicillin-Pot Clavulanate] Diarrhea   Lexapro [Escitalopram] Other (See Comments)    Headache, made her feel sad    Objective:   BP 134/70   Pulse 68   Temp 97.9 F (36.6 C)   Resp 20   Ht '5\' 3"'$  (1.6 m)   Wt 150 lb (68 kg)   LMP 07/04/2000   BMI 26.57 kg/m    Physical Exam Vitals reviewed.  Constitutional:      General: She is not in acute distress.    Appearance: Normal appearance. She is not ill-appearing, toxic-appearing  or diaphoretic.  HENT:     Head: Normocephalic and atraumatic.  Eyes:     General: No scleral icterus.       Right eye: No discharge.        Left eye: No discharge.     Conjunctiva/sclera: Conjunctivae normal.  Cardiovascular:     Rate and Rhythm: Normal rate.  Pulmonary:     Effort: Pulmonary effort is normal. No respiratory distress.  Abdominal:     General: There is no distension.     Palpations: There is no mass.     Tenderness: There is abdominal tenderness in the suprapubic area. There is no right CVA tenderness or left CVA tenderness.   Musculoskeletal:        General: Normal range of motion.     Cervical back: Normal range of motion.  Skin:    General: Skin is warm and dry.     Capillary Refill: Capillary refill takes less than 2 seconds.  Neurological:     General: No focal deficit present.     Mental Status: She is alert and oriented to person, place, and time. Mental status is at baseline.  Psychiatric:        Mood and Affect: Mood normal.        Behavior: Behavior normal.        Thought Content: Thought content normal.        Judgment: Judgment normal.

## 2022-08-14 ENCOUNTER — Other Ambulatory Visit: Payer: Self-pay | Admitting: Internal Medicine

## 2022-08-15 ENCOUNTER — Other Ambulatory Visit: Payer: Self-pay

## 2022-09-09 ENCOUNTER — Ambulatory Visit (HOSPITAL_BASED_OUTPATIENT_CLINIC_OR_DEPARTMENT_OTHER): Payer: Medicare PPO | Admitting: Obstetrics & Gynecology

## 2022-10-07 ENCOUNTER — Ambulatory Visit (INDEPENDENT_AMBULATORY_CARE_PROVIDER_SITE_OTHER): Payer: Medicare PPO | Admitting: Obstetrics & Gynecology

## 2022-10-07 ENCOUNTER — Encounter (HOSPITAL_BASED_OUTPATIENT_CLINIC_OR_DEPARTMENT_OTHER): Payer: Self-pay | Admitting: Obstetrics & Gynecology

## 2022-10-07 VITALS — BP 138/82 | HR 69 | Ht 63.0 in | Wt 151.0 lb

## 2022-10-07 DIAGNOSIS — H5213 Myopia, bilateral: Secondary | ICD-10-CM | POA: Diagnosis not present

## 2022-10-07 DIAGNOSIS — H04123 Dry eye syndrome of bilateral lacrimal glands: Secondary | ICD-10-CM | POA: Diagnosis not present

## 2022-10-07 DIAGNOSIS — Z01419 Encounter for gynecological examination (general) (routine) without abnormal findings: Secondary | ICD-10-CM | POA: Diagnosis not present

## 2022-10-07 DIAGNOSIS — N281 Cyst of kidney, acquired: Secondary | ICD-10-CM | POA: Diagnosis not present

## 2022-10-07 DIAGNOSIS — H2513 Age-related nuclear cataract, bilateral: Secondary | ICD-10-CM | POA: Diagnosis not present

## 2022-10-07 DIAGNOSIS — L292 Pruritus vulvae: Secondary | ICD-10-CM

## 2022-10-07 DIAGNOSIS — M85852 Other specified disorders of bone density and structure, left thigh: Secondary | ICD-10-CM | POA: Diagnosis not present

## 2022-10-07 DIAGNOSIS — H524 Presbyopia: Secondary | ICD-10-CM | POA: Diagnosis not present

## 2022-10-07 DIAGNOSIS — H43813 Vitreous degeneration, bilateral: Secondary | ICD-10-CM | POA: Diagnosis not present

## 2022-10-07 DIAGNOSIS — E039 Hypothyroidism, unspecified: Secondary | ICD-10-CM | POA: Diagnosis not present

## 2022-10-07 MED ORDER — TRIAMCINOLONE ACETONIDE 0.1 % EX OINT: 1.0000 | TOPICAL_OINTMENT | Freq: Two times a day (BID) | CUTANEOUS | 0 refills | Status: DC

## 2022-10-07 NOTE — Progress Notes (Addendum)
77 y.o. G4P3 Widowed White or Caucasian female here for breast and pelvic exam.  Doing well.  Denies vaginal bleeding.  Having some external vaginal itching.  She's using Summer's Eve wash.  Advised to stop.  She isn't having hot flashes any more.  Tdap due.  Pt feels like she's had enough vaccines this year.  She knows to have this done if have exposure.    Patient's last menstrual period was 07/04/2000.          Sexually active: No.  H/O STD:  no  Health Maintenance: PCP:  Dr. Lawerance Bach.  Last wellness appt was 09/2021.  Has appt scheduled.  Will do blood work then.   Vaccines are up to date:  Tdap is due Colonoscopy:  2014, Dr. Christella Hartigan.  Follow up 10 years. MMG:  08/02/2022 BMD:  04/2020 Last pap smear:  not indicated.   H/o abnormal pap smear:      reports that she quit smoking about 48 years ago. Her smoking use included cigarettes. She has a 12.00 pack-year smoking history. She has never used smokeless tobacco. She reports that she does not drink alcohol and does not use drugs.  Past Medical History:  Diagnosis Date   Allergy    SEASONAL   Fibroid    no surgery   Heart murmur    Hyperlipidemia    Hypothyroidism    Migraines    Mild intermittent asthma 07/20/2015   pt is currently not taking any medications   Osteopenia    Sciatic nerve lesion 2018   Vitamin D deficiency     Past Surgical History:  Procedure Laterality Date   DENTAL SURGERY     DORSAL COMPARTMENT RELEASE Right 03/20/2018   Procedure: RIGHT OPEN  RELEASE DORSAL COMPARTMENT (DEQUERVAINS);  Surgeon: Cindee Salt, MD;  Location: [facility];  Service: Orthopedics;  Laterality: Right;   HYSTEROSCOPY WITH D & C     LUMBAR FUSION     Dr Gerlene Fee   SEPTOPLASTY     SINOSCOPY     TUBAL LIGATION     WRIST ARTHROSCOPY WITH FOVEAL TRIANGULAR FIBROCARTILAGE COMPLEX REPAIR Right 03/20/2018   Procedure: RIGHT WRIST ARTHROSCOPY WITH DEBRIDEMENT, SCAPHOLUNATE/TRIANGULAR FIBROCARTILAGE COMPLEX REPAIR;  Surgeon:  Cindee Salt, MD;  [facility];  Service: Orthopedics;  Laterality: Right;    Current Outpatient Medications  Medication Sig Dispense Refill   albuterol (PROAIR HFA) 108 (90 Base) MCG/ACT inhaler INHALE 2 PUFFS EVERY 4 HOURS AS NEEDED FOR WHEEZING OR COUGHING SPELLS 18 g 2   Ascorbic Acid (VITAMIN C) 1000 MG tablet Take 1,000 mg by mouth daily.     aspirin 81 MG tablet Take 81 mg by mouth daily.     Azelastine-Fluticasone 137-50 MCG/ACT SUSP Place 2 sprays into both nostrils in the morning and at bedtime. 23 g 12   Cholecalciferol (VITAMIN D3) 1000 UNITS CAPS Take by mouth daily.     Fluticasone Furoate (ARNUITY ELLIPTA) 100 MCG/ACT AEPB Inhale 1 puff into the lungs daily. Use per asthma action plan. 30 each 3   folic acid (FOLVITE) 1 MG tablet Take 1 mg by mouth daily.     levothyroxine (SYNTHROID) 50 MCG tablet TAKE 2 TABLETS BY MOUTH EVERY DAY 180 tablet 1   Multiple Vitamin (MULTIVITAMIN) tablet Take by mouth.     pravastatin (PRAVACHOL) 20 MG tablet TAKE 1 TABLET BY MOUTH EVERY DAY 90 tablet 1   triamcinolone ointment (KENALOG) 0.1 % Apply 1 Application topically 2 (two) times  daily. 30 g 0   No current facility-administered medications for this visit.    Family History  Problem Relation Age of Onset   Hypertension Mother    Heart disease Mother        CHF; AS   Osteoporosis Mother    Heart attack Mother [id number]   Allergic rhinitis Mother    Diabetes Father    Stroke Father        onset in 4060s   Cancer Brother        malignant brain tumor   Colon cancer Neg Hx    Esophageal cancer Neg Hx    Rectal cancer Neg Hx    Stomach cancer Neg Hx    Angioedema Neg Hx    Asthma Neg Hx    Eczema Neg Hx    Immunodeficiency Neg Hx    Urticaria Neg Hx    Breast cancer Neg Hx     Review of Systems  Constitutional: Negative.   Genitourinary:        Vulvar itching    Exam:   BP 138/82   Pulse 69   Ht 5\' 3"  (1.6 m) Comment: Reported  Wt 151 lb (68.5 kg)    LMP 07/04/2000   BMI 26.75 kg/m   Height: 5\' 3"  (160 cm) (Reported)  General appearance: alert, cooperative and appears stated age Breasts: normal appearance, no masses or tenderness Abdomen: soft, non-tender; bowel sounds normal; no masses,  no organomegaly Lymph nodes: Cervical, supraclavicular, and axillary nodes normal.  No abnormal inguinal nodes palpated Neurologic: Grossly normal  Pelvic: External genitalia:  erythema of superior labia minora, no lesoins              Urethra:  normal appearing urethra with no masses, tenderness or lesions              Bartholins and Skenes: normal                 Vagina: normal appearing vagina with atrophic changes and no discharge, no lesions              Cervix: no lesions              Pap taken: No. Bimanual Exam:  Uterus:  normal size, contour, position, consistency, mobility, non-tender              Adnexa: normal adnexa               Rectovaginal: Confirms               Anus:  normal sphincter tone, no lesions  Chaperone, Ina Homesonya Lenox, CMA, was present for exam.  Assessment/Plan: 1. Encntr for gyn exam (general) (routine) w/o abn findings - Pap smear not indicated - Mammogram up to date - Colonoscopy 2014.  Pt aware it is due this year. - Bone mineral density 2021 - lab work done with PCP, Dr. Lawerance BachBurns - vaccines reviewed/updated.  Aware tdap due.  2. Osteopenia of neck of left femur  3. Complex renal cyst, left - had last MRI 02/2022.  Being followed for 5 years.  4. Acquired hypothyroidism - on levothyroxine  5. Vulvar itching - pt advised to stop using Summer's Eve.  Will also treat with topical steroid. - triamcinolone ointment (KENALOG) 0.1 %; Apply 1 Application topically 2 (two) times daily.  Dispense: 30 g; Refill: 0

## 2022-10-13 ENCOUNTER — Other Ambulatory Visit: Payer: Self-pay | Admitting: Internal Medicine

## 2022-11-03 DIAGNOSIS — H2512 Age-related nuclear cataract, left eye: Secondary | ICD-10-CM | POA: Diagnosis not present

## 2022-11-03 DIAGNOSIS — H269 Unspecified cataract: Secondary | ICD-10-CM | POA: Diagnosis not present

## 2022-11-09 ENCOUNTER — Other Ambulatory Visit: Payer: Self-pay | Admitting: Internal Medicine

## 2022-11-16 ENCOUNTER — Telehealth: Payer: Self-pay | Admitting: Internal Medicine

## 2022-11-16 MED ORDER — AZELASTINE-FLUTICASONE 137-50 MCG/ACT NA SUSP: 2.0000 | Freq: Two times a day (BID) | NASAL | 0 refills | Status: DC

## 2022-11-16 NOTE — Telephone Encounter (Signed)
Patient called and made appointment for wed. 11/23/2022 at 11;00 and needs to have Azelastine-Fluticasone called into [facility] [address]. [phone]

## 2022-11-16 NOTE — Telephone Encounter (Signed)
Courtesy medication refill on Azelastine - Fluticasone 137-50 mcg/act sent in to [facility]  Patient must keep 11/12/22 @ 11 am appt w/Dr. Maurine Minister for future refills.   Patient received courtesy refill in April.

## 2022-11-22 NOTE — Progress Notes (Signed)
FOLLOW UP Date of Service/Encounter:  11/23/22   Subjective:  Pamela Rich (DOB: [birth year]) is a 77 y.o. female who [facility] in re-evaluation of the following: asthma, allergic rhinitis History obtained from: chart review and patient.  For Review, LV was on 10/22/21  with Dr. Delorse Lek seen for acute visit for sinus infection . See below for summary of history and diagnostics.  Therapeutic plans/changes recommended: She was continued on her Augmentin and prednisone course prescribed 4 days earlier.    Pertinent History/Diagnostics:  Asthma: mild intermittent Uses flovent 110, 2 puffs BID in springtime only. Switched to arnuity due to insurance.  - pre/post spirometry (2017): mild reduction in the forced vital capacity and FEV1 with significant improvement in the FEV1 after albuterol   - AEC 300 on 08/10/21 Allergic Rhinitis:  Controlled on fluticasone/azelastine spray  - SPT environmental panel (07/20/15): + mold on IDs only - She has had nasal surgery by Dr. Haroldine Laws in 1979.  No longer actively followed by ENT  Today presents for follow-up. She is doing well since last visit. She does report increased drainage, cough and sputum production during spring pollen. She feels controlled when using nasal saline spray followed by fluticasone/azelastine nasal spray which she uses consistently throughout the year. She has used arnuity and rescue inhaler a few times during pollen season, but this is not frequent, and has been several weeks since needing. She prefers flovent over arnuity, citing ease of use, but this is no longer affordable due to insurance coverage. She feels her breathing has been under excellent control. Has not used arnuity more than one day at a time.  She would like year long refills for her nasal sprays. She is going to be getting cataract surgery on her right eye soon and will take her albuterol for her procedure but has  never had issues with surgery and her asthma. Her left eye was operated a few weeks ago. She is seeing much better with this eye and looks forward to getting the right fixed as well.   Allergies as of 11/23/2022       Reactions   Augmentin [amoxicillin-pot Clavulanate] Diarrhea   Lexapro [escitalopram] Other (See Comments)   Headache, made her feel sad        Medication List        Accurate as of Nov 23, 2022 12:19 PM. If you have any questions, ask your nurse or doctor.          albuterol 108 (90 Base) MCG/ACT inhaler Commonly known as: ProAir HFA INHALE 2 PUFFS EVERY 4 HOURS AS NEEDED FOR WHEEZING OR COUGHING SPELLS   Arnuity Ellipta 100 MCG/ACT Aepb Generic drug: Fluticasone Furoate Inhale 1 puff into the lungs daily. Use per asthma action plan.   aspirin 81 MG tablet Take 81 mg by mouth daily.   Azelastine-Fluticasone 137-50 MCG/ACT Susp Place 2 sprays into both nostrils in the morning and at bedtime.   folic acid 1 MG tablet Commonly known as: FOLVITE Take 1 mg by mouth daily.   levothyroxine 50 MCG tablet Commonly known as: SYNTHROID TAKE 2 TABLETS BY MOUTH EVERY DAY   multivitamin tablet Take by mouth.   pravastatin 20 MG tablet Commonly known as: PRAVACHOL TAKE 1 TABLET BY MOUTH EVERY DAY   triamcinolone ointment 0.1 % Commonly known as: KENALOG Apply 1 Application topically 2 (two) times daily.   vitamin C 1000 MG tablet Take 1,000 mg by mouth daily.  Vitamin D3 25 MCG (1000 UT) Caps Take by mouth daily.       Past Medical History:  Diagnosis Date   Allergy    SEASONAL   Fibroid    no surgery   Heart murmur    Hyperlipidemia    Hypothyroidism    Migraines    Mild intermittent asthma 07/20/2015   pt is currently not taking any medications   Osteopenia    Sciatic nerve lesion 2018   Vitamin D deficiency    Past Surgical History:  Procedure Laterality Date   DENTAL SURGERY     DORSAL COMPARTMENT RELEASE Right 03/20/2018    Procedure: RIGHT OPEN  RELEASE DORSAL COMPARTMENT (DEQUERVAINS);  Surgeon: Cindee Salt, MD;  Location: [facility];  Service: Orthopedics;  Laterality: Right;   HYSTEROSCOPY WITH D & C     LUMBAR FUSION     Dr Gerlene Fee   SEPTOPLASTY     SINOSCOPY     TUBAL LIGATION     WRIST ARTHROSCOPY WITH FOVEAL TRIANGULAR FIBROCARTILAGE COMPLEX REPAIR Right 03/20/2018   Procedure: RIGHT WRIST ARTHROSCOPY WITH DEBRIDEMENT, SCAPHOLUNATE/TRIANGULAR FIBROCARTILAGE COMPLEX REPAIR;  Surgeon: Cindee Salt, MD;  Location: [facility];  Service: Orthopedics;  Laterality: Right;   Otherwise, there have been no changes to her past medical history, surgical history, family history, or social history.  ROS: All others negative except as noted per HPI.   Objective:  BP (!) 138/90   Pulse 70   Temp 97.7 F (36.5 C) (Temporal)   Ht 5' 2.6" (1.59 m)   Wt 149 lb 11.2 oz (67.9 kg)   LMP 07/04/2000   SpO2 96%   BMI 26.86 kg/m  Body mass index is 26.86 kg/m. Physical Exam: General Appearance:  Alert, cooperative, no distress, appears stated age  Head:  Normocephalic, without obvious abnormality, atraumatic  Eyes:  Conjunctiva clear, EOM's intact  Nose: Nares normal, hypertrophic turbinates, normal mucosa, and no visible anterior polyps  Throat: Lips, tongue normal; teeth and gums normal,  some drainage noted posteriorly and normal posterior oropharynx  Neck: Supple, symmetrical  Lungs:   clear to auscultation bilaterally, Respirations unlabored, no coughing  Heart:  regular rate and rhythm and no murmur, Appears well perfused  Extremities: No edema  Skin: Skin color, texture, turgor normal and no rashes or lesions on visualized portions of skin  Neurologic: No gross deficits  Spirometry:  Tracings reviewed. Her effort: It was hard to get consistent efforts and there is a question as to whether this reflects a maximal maneuver. FVC: 2.22L FEV1: 1.65L, 84% predicted FEV1/FVC ratio:  0.74 Interpretation: No overt abnormalities noted given today's efforts.  Please see scanned spirometry results for details.  Assessment/Plan   Mild intermittent asthma-controlled Continue albuterol HFA, 1-2 inhalations every 4-6 hours if needed. During Spring season, respiratory tract infections or asthma flares, add Arnuity 100 g 1 inhalation daily symptoms have returned to baseline.  Other allergic rhinitis-controlled Continue appropriate allergen avoidance measures. Continue azelastine/fluticasone nasal spray if needed. Nasal saline spray (i.e., Simply Saline) or nasal saline lavage (i.e., NeilMed) is recommended as needed and prior to medicated nasal sprays.   Return in about 12 months, sooner if needed.  It was a pleasure seeing you again in clinic today! Thank you for allowing me to participate in your care.  Tonny Bollman, MD  [facility]

## 2022-11-23 ENCOUNTER — Other Ambulatory Visit: Payer: Self-pay

## 2022-11-23 ENCOUNTER — Ambulatory Visit: Payer: Medicare PPO | Admitting: Internal Medicine

## 2022-11-23 ENCOUNTER — Encounter: Payer: Self-pay | Admitting: Internal Medicine

## 2022-11-23 VITALS — BP 138/90 | HR 70 | Temp 97.7°F | Ht 62.6 in | Wt 149.7 lb

## 2022-11-23 DIAGNOSIS — J3089 Other allergic rhinitis: Secondary | ICD-10-CM

## 2022-11-23 DIAGNOSIS — J4520 Mild intermittent asthma, uncomplicated: Secondary | ICD-10-CM | POA: Diagnosis not present

## 2022-11-23 MED ORDER — ALBUTEROL SULFATE HFA 108 (90 BASE) MCG/ACT IN AERS: INHALATION_SPRAY | RESPIRATORY_TRACT | 2 refills | Status: AC

## 2022-11-23 MED ORDER — AZELASTINE-FLUTICASONE 137-50 MCG/ACT NA SUSP: 2.0000 | Freq: Two times a day (BID) | NASAL | 11 refills | Status: DC

## 2022-11-23 MED ORDER — ARNUITY ELLIPTA 100 MCG/ACT IN AEPB: 1.0000 | INHALATION_SPRAY | Freq: Every day | RESPIRATORY_TRACT | 3 refills | Status: DC

## 2022-11-23 NOTE — Patient Instructions (Addendum)
Mild intermittent asthma Continue albuterol HFA, 1-2 inhalations every 4-6 hours if needed. During Spring season, respiratory tract infections or asthma flares, add Arnuity 100 g 1 inhalation daily symptoms have returned to baseline.  Other allergic rhinitis Continue appropriate allergen avoidance measures. Continue azelastine/fluticasone nasal spray if needed. Nasal saline spray (i.e., Simply Saline) or nasal saline lavage (i.e., NeilMed) is recommended as needed and prior to medicated nasal sprays.   Return in about 12 months, sooner if needed.  It was a pleasure seeing you again in clinic today! Thank you for allowing me to participate in your care.  Tonny Bollman, MD [facility]

## 2022-11-24 DIAGNOSIS — H25811 Combined forms of age-related cataract, right eye: Secondary | ICD-10-CM | POA: Diagnosis not present

## 2022-11-24 DIAGNOSIS — H2511 Age-related nuclear cataract, right eye: Secondary | ICD-10-CM | POA: Diagnosis not present

## 2022-12-22 ENCOUNTER — Encounter: Payer: Self-pay | Admitting: Gastroenterology

## 2022-12-26 ENCOUNTER — Ambulatory Visit (INDEPENDENT_AMBULATORY_CARE_PROVIDER_SITE_OTHER): Payer: Medicare PPO

## 2022-12-26 VITALS — Ht 62.6 in | Wt 150.0 lb

## 2022-12-26 DIAGNOSIS — Z0000 Encounter for general adult medical examination without abnormal findings: Secondary | ICD-10-CM | POA: Diagnosis not present

## 2022-12-26 NOTE — Progress Notes (Signed)
Subjective:   Pamela Rich is a 77 y.o. female who presents for Medicare Annual (Subsequent) preventive examination.  Visit Complete: Virtual  I connected with  Phylliss Blakes on 12/26/22 by a audio enabled telemedicine application and verified that I am speaking with the correct person using two identifiers.  Patient Location: Home  Provider Location: Office/Clinic  I discussed the limitations of evaluation and management by telemedicine. The patient expressed understanding and agreed to proceed.  Patient Medicare AWV questionnaire was completed by the patient on 12/22/2022 I have confirmed that all information answered by patient is correct and no changes since this date.  Review of Systems     Cardiac Risk Factors include: advanced age (>35men, >46 women);dyslipidemia;family history of premature cardiovascular disease     Objective:    Today's Vitals   12/26/22 1018  Weight: 150 lb (68 kg)  Height: 5' 2.6" (1.59 m)  PainSc: 0-No pain   Body mass index is 26.91 kg/m.     12/26/2022   10:21 AM 12/20/2021    3:03 PM 12/09/2020    1:58 PM 01/07/2020   10:22 AM 10/14/2019   12:23 PM 08/30/2018    1:26 PM 03/20/2018    7:21 AM  Advanced Directives  Does Patient Have a Medical Advance Directive? Yes Yes Yes Yes Yes Yes Yes  Type of Estate agent of [place];Living will Healthcare Power of [place];Living will Healthcare Power of [place];Living will Healthcare Power of [place];Living will Healthcare Power of [place];Living will Healthcare Power of [place];Living will Healthcare Power of [place];Living will  Does patient want to make changes to medical advance directive?   No - Patient declined No - Patient declined No - Patient declined    Copy of Healthcare Power of Attorney in Chart? No - copy requested No - copy requested No - copy requested Yes - validated most recent copy scanned in chart (See row information)  No - copy requested No - copy  requested    Current Medications (verified) Outpatient Encounter Medications as of 12/26/2022  Medication Sig   albuterol (PROAIR HFA) 108 (90 Base) MCG/ACT inhaler INHALE 2 PUFFS EVERY 4 HOURS AS NEEDED FOR WHEEZING OR COUGHING SPELLS   Ascorbic Acid (VITAMIN C) 1000 MG tablet Take 1,000 mg by mouth daily.   aspirin 81 MG tablet Take 81 mg by mouth daily.   Azelastine-Fluticasone 137-50 MCG/ACT SUSP Place 2 sprays into both nostrils in the morning and at bedtime.   Cholecalciferol (VITAMIN D3) 1000 UNITS CAPS Take by mouth daily.   Fluticasone Furoate (ARNUITY ELLIPTA) 100 MCG/ACT AEPB Inhale 1 puff into the lungs daily. Use per asthma action plan.   folic acid (FOLVITE) 1 MG tablet Take 1 mg by mouth daily.   levothyroxine (SYNTHROID) 50 MCG tablet TAKE 2 TABLETS BY MOUTH EVERY DAY   Multiple Vitamin (MULTIVITAMIN) tablet Take by mouth.   pravastatin (PRAVACHOL) 20 MG tablet TAKE 1 TABLET BY MOUTH EVERY DAY   triamcinolone ointment (KENALOG) 0.1 % Apply 1 Application topically 2 (two) times daily. (Patient not taking: Reported on 11/23/2022)   No facility-administered encounter medications on file as of 12/26/2022.    Allergies (verified) Augmentin [amoxicillin-pot clavulanate] and Lexapro [escitalopram]   History: Past Medical History:  Diagnosis Date   Allergy    SEASONAL   Fibroid    no surgery   Heart murmur    Hyperlipidemia    Hypothyroidism    Migraines    Mild intermittent asthma 07/20/2015   pt  is currently not taking any medications   Osteopenia    Sciatic nerve lesion 2018   Vitamin D deficiency    Past Surgical History:  Procedure Laterality Date   DENTAL SURGERY     DORSAL COMPARTMENT RELEASE Right 03/20/2018   Procedure: RIGHT OPEN  RELEASE DORSAL COMPARTMENT (DEQUERVAINS);  Surgeon: Cindee Salt, MD;  Location: [facility];  Service: Orthopedics;  Laterality: Right;   HYSTEROSCOPY WITH D & C     LUMBAR FUSION     Dr Gerlene Fee   SEPTOPLASTY      SINOSCOPY     TUBAL LIGATION     WRIST ARTHROSCOPY WITH FOVEAL TRIANGULAR FIBROCARTILAGE COMPLEX REPAIR Right 03/20/2018   Procedure: RIGHT WRIST ARTHROSCOPY WITH DEBRIDEMENT, SCAPHOLUNATE/TRIANGULAR FIBROCARTILAGE COMPLEX REPAIR;  Surgeon: Cindee Salt, MD;  Location: [facility];  Service: Orthopedics;  Laterality: Right;   Family History  Problem Relation Age of Onset   Hypertension Mother    Heart disease Mother        CHF; AS   Osteoporosis Mother    Heart attack Mother 39   Allergic rhinitis Mother    Diabetes Father    Stroke Father        onset in 3s   Cancer Brother        malignant brain tumor   Colon cancer Neg Hx    Esophageal cancer Neg Hx    Rectal cancer Neg Hx    Stomach cancer Neg Hx    Angioedema Neg Hx    Asthma Neg Hx    Eczema Neg Hx    Immunodeficiency Neg Hx    Urticaria Neg Hx    Breast cancer Neg Hx    Social History   Socioeconomic History   Marital status: Widowed    Spouse name: Not on file   Number of children: 3   Years of education: Not on file   Highest education level: Not on file  Occupational History   Occupation: retired  Tobacco Use   Smoking status: Former    Packs/day: 0.30    Years: 40.00    Additional pack years: 0.00    Total pack years: 12.00    Types: Cigarettes    Quit date: 07/04/1974    Years since quitting: 48.5   Smokeless tobacco: Never   Tobacco comments:    smoked 1965-1976 , < 3 cig/ day  Vaping Use   Vaping Use: Never used  Substance and Sexual Activity   Alcohol use: No    Alcohol/week: 0.0 standard drinks of alcohol    Comment: rarely   Drug use: No   Sexual activity: Not Currently    Partners: Male    Birth control/protection: Surgical, Post-menopausal    Comment: BTL  Other Topics Concern   Not on file  Social History Narrative   Exercise: walking, stretching, weights   Social Determinants of Health   Financial Resource Strain: Low Risk  (12/26/2022)   Overall Financial Resource  Strain (CARDIA)    Difficulty of Paying Living Expenses: Not hard at all  Food Insecurity: No Food Insecurity (12/26/2022)   Hunger Vital Sign    Worried About Running Out of Food in the Last Year: Never true    Ran Out of Food in the Last Year: Never true  Transportation Needs: No Transportation Needs (12/26/2022)   PRAPARE - Administrator, Civil Service (Medical): No    Lack of Transportation (Non-Medical): No  Physical Activity: Insufficiently Active (12/26/2022)  Exercise Vital Sign    Days of Exercise per Week: 2 days    Minutes of Exercise per Session: 30 min  Stress: No Stress Concern Present (12/26/2022)   [employer] of Occupational Health - Occupational Stress Questionnaire    Feeling of Stress : Not at all  Social Connections: Unknown (12/26/2022)   Social Connection and Isolation Panel [NHANES]    Frequency of Communication with Friends and Family: More than three times a week    Frequency of Social Gatherings with Friends and Family: Twice a week    Attends Religious Services: Not on Marketing executive or Organizations: Yes    Attends Banker Meetings: More than 4 times per year    Marital Status: Widowed    Tobacco Counseling Counseling given: Not Answered Tobacco comments: smoked 1965-1976 , < 3 cig/ day   Clinical Intake:  Pre-visit preparation completed: Yes  Pain : No/denies pain Pain Score: 0-No pain     BMI - recorded: 26.91 Nutritional Status: BMI 25 -29 Overweight Nutritional Risks: None Diabetes: No  How often do you need to have someone help you when you read instructions, pamphlets, or other written materials from your doctor or pharmacy?: 1 - Never  Interpreter Needed?: No  Comments: HSG Information entered by :: Harjit Douds N. Vollie Brunty, LPN.   Activities of Daily Living    12/26/2022   10:29 AM 12/22/2022    9:55 AM  In your present state of health, do you have any difficulty performing the  following activities:  Hearing? 0 0  Vision? 0 0  Difficulty concentrating or making decisions? 0 0  Walking or climbing stairs? 0 0  Dressing or bathing? 0 0  Doing errands, shopping? 0 0  Preparing Food and eating ? N N  Using the Toilet? N N  In the past six months, have you accidently leaked urine? N N  Comment wear a mini pad   Do you have problems with loss of bowel control? N N  Managing your Medications? N N  Managing your Finances? N N  Housekeeping or managing your Housekeeping? N N    Patient Care Team: Pincus Sanes, MD as PCP - General (Internal Medicine) Bobbitt, Heywood Iles, MD as Consulting Physician (Allergy and Immunology) Jerene Bears, MD as Consulting Physician (Gynecology) Arminda Resides, MD as Consulting Physician (Dermatology) Janet Berlin, MD as Consulting Physician (Ophthalmology)  Indicate any recent Medical Services you may have received from other than [facility] providers in the past year (date may be approximate).     Assessment:   This is a routine wellness examination for [place].  Hearing/Vision screen Hearing Screening - Comments:: Denies hearing difficulties.  Vision Screening - Comments:: Wears rx glasses - up to date with routine eye exams with Janet Berlin, MD.   Dietary issues and exercise activities discussed:     Goals Addressed             This Visit's Progress    My healthcare goal is to exercise more by walking in the park.        Depression Screen    12/26/2022   10:21 AM 10/07/2022   10:31 AM 08/08/2022    2:07 PM 06/06/2022    3:07 PM 12/20/2021    3:04 PM 12/20/2021    3:02 PM 12/17/2021    2:22 PM  PHQ 2/9 Scores  PHQ - 2 Score 0 0 0 0 0 0 0  PHQ- 9  Score 0      1    Fall Risk    12/26/2022   10:29 AM 12/22/2022    9:55 AM 08/08/2022    2:07 PM 06/06/2022    3:07 PM 12/20/2021    3:04 PM  Fall Risk   Falls in the past year? 0 0 0 0 0  Number falls in past yr: 0  0 0 0  Injury with Fall? 0  0 0 0  Risk for  fall due to : No Fall Risks  No Fall Risks No Fall Risks   Follow up Falls prevention discussed  Falls evaluation completed Falls evaluation completed Falls evaluation completed    MEDICARE RISK AT HOME:  Medicare Risk at Home - 12/26/22 1031     Any stairs in or around the home? Yes    If so, are there any without handrails? No    Home free of loose throw rugs in walkways, pet beds, electrical cords, etc? Yes    Adequate lighting in your home to reduce risk of falls? Yes    Life alert? No    Use of a cane, walker or w/c? No    Grab bars in the bathroom? Yes    Shower chair or bench in shower? No    Elevated toilet seat or a handicapped toilet? Yes             TIMED UP AND GO:  Was the test performed?  No    Cognitive Function:        12/26/2022   10:30 AM 10/14/2019   12:29 PM  6CIT Screen  What Year? 0 points 0 points  What month? 0 points 0 points  What time? 0 points 0 points  Count back from 20 0 points 0 points  Months in reverse 0 points 0 points  Repeat phrase 0 points 0 points  Total Score 0 points 0 points    Immunizations Immunization History  Administered Date(s) Administered   Fluad Quad(high Dose 65+) 03/14/2019, 03/17/2022   Influenza Split 04/06/2011, 04/18/2012   Influenza Whole 04/18/2007, 04/23/2008, 04/08/2009, 03/26/2010   Influenza, High Dose Seasonal PF 04/16/2013, 03/24/2016, 04/04/2017, 04/13/2018, 03/16/2020, 03/22/2021   Influenza,inj,Quad PF,6+ Mos 03/18/2014, 03/24/2015   PFIZER Comirnaty(Gray Top)Covid-19 Tri-Sucrose Vaccine 10/19/2020   PFIZER(Purple Top)SARS-COV-2 Vaccination 08/10/2019, 08/31/2019, 03/23/2020   Pfizer Covid-19 Vaccine Bivalent Booster 57yrs & up 03/25/2021   Pneumococcal Conjugate-13 11/04/2014   Pneumococcal Polysaccharide-23 01/22/2007, 11/12/2012   Respiratory Syncytial Virus Vaccine,Recomb Aduvanted(Arexvy) 08/04/2022   Tdap 07/26/2012   Unspecified SARS-COV-2 Vaccination 04/14/2022   Zoster Recombinat  (Shingrix) 05/05/2022, 09/06/2022   Zoster, Live 09/24/2007    TDAP status: Due, Education has been provided regarding the importance of this vaccine. Advised may receive this vaccine at local pharmacy or Health Dept. Aware to provide a copy of the vaccination record if obtained from local pharmacy or Health Dept. Verbalized acceptance and understanding.  Flu Vaccine status: Up to date  Pneumococcal vaccine status: Up to date  Covid-19 vaccine status: Completed vaccines  Qualifies for Shingles Vaccine? Yes   Zostavax completed Yes   Shingrix Completed?: Yes  Screening Tests Health Maintenance  Topic Date Due   COVID-19 Vaccine (7 - 2023-24 season) 06/09/2022   DTaP/Tdap/Td (2 - Td or Tdap) 07/26/2022   INFLUENZA VACCINE  02/02/2023   DEXA SCAN  04/17/2023   Medicare Annual Wellness (AWV)  12/26/2023   Pneumonia Vaccine 68+ Years old  Completed   Hepatitis C Screening  Completed  Zoster Vaccines- Shingrix  Completed   HPV VACCINES  Aged Out   Colonoscopy  Discontinued    Health Maintenance  Health Maintenance Due  Topic Date Due   COVID-19 Vaccine (7 - 2023-24 season) 06/09/2022   DTaP/Tdap/Td (2 - Td or Tdap) 07/26/2022    Colorectal cancer screening: No longer required.   Mammogram status: Completed 07/29/2022. Repeat every year  Bone Density status: Completed 04/16/2020. Results reflect: Bone density results: OSTEOPENIA. Repeat every 3 years.  Lung Cancer Screening: (Low Dose CT Chest recommended if Age 85-80 years, 20 pack-year currently smoking OR have quit w/in 15years.) does not qualify.   Lung Cancer Screening Referral: no  Additional Screening:  Hepatitis C Screening: does qualify; Completed 08/23/2021  Vision Screening: Recommended annual ophthalmology exams for early detection of glaucoma and other disorders of the eye. Is the patient up to date with their annual eye exam?  Yes  Who is the provider or what is the name of the office in which the patient  attends annual eye exams? Janet Berlin, MD. If pt is not established with a provider, would they like to be referred to a provider to establish care? No .   Dental Screening: Recommended annual dental exams for proper oral hygiene  Diabetic Foot Exam: N/A  Community Resource Referral / Chronic Care Management: CRR required this visit?  No   CCM required this visit?  No     Plan:     I have personally reviewed and noted the following in the patient's chart:   Medical and social history Use of alcohol, tobacco or illicit drugs  Current medications and supplements including opioid prescriptions. Patient is not currently taking opioid prescriptions.. Functional ability and status Nutritional status Physical activity Advanced directives List of other physicians Hospitalizations, surgeries, and ER visits in previous 12 months Vitals Screenings to include cognitive, depression, and falls Referrals and appointments  In addition, I have reviewed and discussed with patient certain preventive protocols, quality metrics, and best practice recommendations. A written personalized care plan for preventive services as well as general preventive health recommendations were provided to patient.     Mickeal Needy, LPN   [id number]   After Visit Summary: (Mail) Due to this being a telephonic visit, the after visit summary with patients personalized plan was offered to patient via mail   Nurse Notes: Normal cognitive status assessed by direct observation via telephone conversation by this Nurse Health Advisor. No abnormalities found.

## 2022-12-26 NOTE — Patient Instructions (Addendum)
Pamela Rich , Thank you for taking time to come for your Medicare Wellness Visit. I appreciate your ongoing commitment to your health goals. Please review the following plan we discussed and let me know if I can assist you in the future.   These are the goals we discussed:  Goals      My healthcare goal is to exercise more by walking in the park.        This is a list of the screening recommended for you and due dates:  Health Maintenance  Topic Date Due   COVID-19 Vaccine (7 - 2023-24 season) 06/09/2022   DTaP/Tdap/Td vaccine (2 - Td or Tdap) 07/26/2022   Flu Shot  02/02/2023   DEXA scan (bone density measurement)  04/17/2023   Medicare Annual Wellness Visit  12/26/2023   Pneumonia Vaccine  Completed   Hepatitis C Screening  Completed   Zoster (Shingles) Vaccine  Completed   HPV Vaccine  Aged Out   Colon Cancer Screening  Discontinued    Advanced directives: Yes  Conditions/risks identified: Yes  Next appointment: It was nice speaking with you today!  Please follow up in one year for your annual wellness visit via telephone call with Nurse Percell Miller on 01/02/2024 at 10:15 a.m.  If you need to cancel or reschedule please call [phone].  Preventive Care 59 Years and Older, Female Preventive care refers to lifestyle choices and visits with your health care provider that can promote health and wellness. What does preventive care include? A yearly physical exam. This is also called an annual well check. Dental exams once or twice a year. Routine eye exams. Ask your health care provider how often you should have your eyes checked. Personal lifestyle choices, including: Daily care of your teeth and gums. Regular physical activity. Eating a healthy diet. Avoiding tobacco and drug use. Limiting alcohol use. Practicing safe sex. Taking low-dose aspirin every day. Taking vitamin and mineral supplements as recommended by your health care provider. What happens during an annual  well check? The services and screenings done by your health care provider during your annual well check will depend on your age, overall health, lifestyle risk factors, and family history of disease. Counseling  Your health care provider may ask you questions about your: Alcohol use. Tobacco use. Drug use. Emotional well-being. Home and relationship well-being. Sexual activity. Eating habits. History of falls. Memory and ability to understand (cognition). Work and work Astronomer. Reproductive health. Screening  You may have the following tests or measurements: Height, weight, and BMI. Blood pressure. Lipid and cholesterol levels. These may be checked every 5 years, or more frequently if you are over 45 years old. Skin check. Lung cancer screening. You may have this screening every year starting at age 77 if you have a 30-pack-year history of smoking and currently smoke or have quit within the past 15 years. Fecal occult blood test (FOBT) of the stool. You may have this test every year starting at age 77. Flexible sigmoidoscopy or colonoscopy. You may have a sigmoidoscopy every 5 years or a colonoscopy every 10 years starting at age 40. Hepatitis C blood test. Hepatitis B blood test. Sexually transmitted disease (STD) testing. Diabetes screening. This is done by checking your blood sugar (glucose) after you have not eaten for a while (fasting). You may have this done every 1-3 years. Bone density scan. This is done to screen for osteoporosis. You may have this done starting at age 77. Mammogram. This may be done  every 1-2 years. Talk to your health care provider about how often you should have regular mammograms. Talk with your health care provider about your test results, treatment options, and if necessary, the need for more tests. Vaccines  Your health care provider may recommend certain vaccines, such as: Influenza vaccine. This is recommended every year. Tetanus, diphtheria,  and acellular pertussis (Tdap, Td) vaccine. You may need a Td booster every 10 years. Zoster vaccine. You may need this after age 77. Pneumococcal 13-valent conjugate (PCV13) vaccine. One dose is recommended after age 77. Pneumococcal polysaccharide (PPSV23) vaccine. One dose is recommended after age 77. Talk to your health care provider about which screenings and vaccines you need and how often you need them. This information is not intended to replace advice given to you by your health care provider. Make sure you discuss any questions you have with your health care provider. Document Released: 07/17/2015 Document Revised: 03/09/2016 Document Reviewed: 04/21/2015 Elsevier Interactive Patient Education  2017 [place] Prevention in the Home Falls can cause injuries. They can happen to people of all ages. There are many things you can do to make your home safe and to help prevent falls. What can I do on the outside of my home? Regularly fix the edges of walkways and driveways and fix any cracks. Remove anything that might make you trip as you walk through a door, such as a raised step or threshold. Trim any bushes or trees on the path to your home. Use bright outdoor lighting. Clear any walking paths of anything that might make someone trip, such as rocks or tools. Regularly check to see if handrails are loose or broken. Make sure that both sides of any steps have handrails. Any raised decks and porches should have guardrails on the edges. Have any leaves, snow, or ice cleared regularly. Use sand or salt on walking paths during winter. Clean up any spills in your garage right away. This includes oil or grease spills. What can I do in the bathroom? Use night lights. Install grab bars by the toilet and in the tub and shower. Do not use towel bars as grab bars. Use non-skid mats or decals in the tub or shower. If you need to sit down in the shower, use a plastic, non-slip  stool. Keep the floor dry. Clean up any water that spills on the floor as soon as it happens. Remove soap buildup in the tub or shower regularly. Attach bath mats securely with double-sided non-slip rug tape. Do not have throw rugs and other things on the floor that can make you trip. What can I do in the bedroom? Use night lights. Make sure that you have a light by your bed that is easy to reach. Do not use any sheets or blankets that are too big for your bed. They should not hang down onto the floor. Have a firm chair that has side arms. You can use this for support while you get dressed. Do not have throw rugs and other things on the floor that can make you trip. What can I do in the kitchen? Clean up any spills right away. Avoid walking on wet floors. Keep items that you use a lot in easy-to-reach places. If you need to reach something above you, use a strong step stool that has a grab bar. Keep electrical cords out of the way. Do not use floor polish or wax that makes floors slippery. If you must use wax, use  non-skid floor wax. Do not have throw rugs and other things on the floor that can make you trip. What can I do with my stairs? Do not leave any items on the stairs. Make sure that there are handrails on both sides of the stairs and use them. Fix handrails that are broken or loose. Make sure that handrails are as long as the stairways. Check any carpeting to make sure that it is firmly attached to the stairs. Fix any carpet that is loose or worn. Avoid having throw rugs at the top or bottom of the stairs. If you do have throw rugs, attach them to the floor with carpet tape. Make sure that you have a light switch at the top of the stairs and the bottom of the stairs. If you do not have them, ask someone to add them for you. What else can I do to help prevent falls? Wear shoes that: Do not have high heels. Have rubber bottoms. Are comfortable and fit you well. Are closed at the  toe. Do not wear sandals. If you use a stepladder: Make sure that it is fully opened. Do not climb a closed stepladder. Make sure that both sides of the stepladder are locked into place. Ask someone to hold it for you, if possible. Clearly mark and make sure that you can see: Any grab bars or handrails. First and last steps. Where the edge of each step is. Use tools that help you move around (mobility aids) if they are needed. These include: Canes. Walkers. Scooters. Crutches. Turn on the lights when you go into a dark area. Replace any light bulbs as soon as they burn out. Set up your furniture so you have a clear path. Avoid moving your furniture around. If any of your floors are uneven, fix them. If there are any pets around you, be aware of where they are. Review your medicines with your doctor. Some medicines can make you feel dizzy. This can increase your chance of falling. Ask your doctor what other things that you can do to help prevent falls. This information is not intended to replace advice given to you by your health care provider. Make sure you discuss any questions you have with your health care provider. Document Released: 04/16/2009 Document Revised: 11/26/2015 Document Reviewed: 07/25/2014 Elsevier Interactive Patient Education  2017 [employer].

## 2022-12-28 ENCOUNTER — Encounter: Payer: Self-pay | Admitting: Internal Medicine

## 2022-12-28 NOTE — Progress Notes (Signed)
Subjective:    Patient ID: Pamela Rich, female    DOB: [birth year], 77 y.o.   MRN: [id number]      HPI [name] is here for a Physical exam and her chronic medical problems.    S/p cataract removal last month both eyes.  Overall doing well.  She does worry about her risk of having a heart attack.  Medications and allergies reviewed with patient and updated if appropriate.  Current Outpatient Medications on File Prior to Visit  Medication Sig Dispense Refill   albuterol (PROAIR HFA) 108 (90 Base) MCG/ACT inhaler INHALE 2 PUFFS EVERY 4 HOURS AS NEEDED FOR WHEEZING OR COUGHING SPELLS 18 g 2   Ascorbic Acid (VITAMIN C) 1000 MG tablet Take 1,000 mg by mouth daily.     aspirin 81 MG tablet Take 81 mg by mouth daily.     Azelastine-Fluticasone 137-50 MCG/ACT SUSP Place 2 sprays into both nostrils in the morning and at bedtime. 23 g 11   Cholecalciferol (VITAMIN D3) 1000 UNITS CAPS Take by mouth daily.     Fluticasone Furoate (ARNUITY ELLIPTA) 100 MCG/ACT AEPB Inhale 1 puff into the lungs daily. Use per asthma action plan. 30 each 3   folic acid (FOLVITE) 1 MG tablet Take 1 mg by mouth daily.     levothyroxine (SYNTHROID) 50 MCG tablet TAKE 2 TABLETS BY MOUTH EVERY DAY 180 tablet 1   Multiple Vitamin (MULTIVITAMIN) tablet Take by mouth.     pravastatin (PRAVACHOL) 20 MG tablet TAKE 1 TABLET BY MOUTH EVERY DAY 90 tablet 1   No current facility-administered medications on file prior to visit.    Review of Systems  Constitutional:  Negative for fever.  Eyes:  Negative for visual disturbance.  Respiratory:  Negative for cough, shortness of breath and wheezing.   Cardiovascular:  Negative for chest pain, palpitations and leg swelling.  Gastrointestinal:  Negative for abdominal pain, blood in stool, constipation and diarrhea.       Occ gerd  Genitourinary:  Negative for dysuria.  Musculoskeletal:  Negative for arthralgias and back pain.  Skin:  Negative for rash.  Neurological:   Positive for headaches (occ - more  - ? sinus related). Negative for light-headedness.  Psychiatric/Behavioral:  Negative for dysphoric mood. The patient is not nervous/anxious.        Objective:   Vitals:   12/29/22 1038  BP: 130/88  Pulse: 67  Temp: 97.9 F (36.6 C)  SpO2: 98%   Filed Weights   12/29/22 1038  Weight: 150 lb (68 kg)   Body mass index is 27 kg/m.  BP Readings from Last 3 Encounters:  12/29/22 130/88  11/23/22 (!) 138/90  10/07/22 138/82    Wt Readings from Last 3 Encounters:  12/29/22 150 lb (68 kg)  12/26/22 150 lb (68 kg)  11/23/22 149 lb 11.2 oz (67.9 kg)       Physical Exam Constitutional: She appears well-developed and well-nourished. No distress.  HENT:  Head: Normocephalic and atraumatic.  Right Ear: External ear normal. Normal ear canal and TM Left Ear: External ear normal.  Normal ear canal and TM Mouth/Throat: Oropharynx is clear and moist.  Eyes: Conjunctivae normal.  Neck: Neck supple. No tracheal deviation present. No thyromegaly present.  No carotid bruit  Cardiovascular: Normal rate, regular rhythm and normal heart sounds.   No murmur heard.  No edema. Pulmonary/Chest: Effort normal and breath sounds normal. No respiratory distress. She has no wheezes. She has no rales.  Breast:  deferred   Abdominal: Soft. She exhibits no distension. There is no tenderness.  Lymphadenopathy: She has no cervical adenopathy.  Skin: Skin is warm and dry. She is not diaphoretic.  Psychiatric: She has a normal mood and affect. Her behavior is normal.     Lab Results  Component Value Date   WBC 6.2 08/10/2021   HGB 12.7 08/10/2021   HCT 38.5 08/10/2021   PLT 314.0 08/10/2021   GLUCOSE 89 08/23/2021   CHOL 168 08/10/2021   TRIG 69.0 08/10/2021   HDL 66.30 08/10/2021   LDLDIRECT 151.9 12/05/2007   LDLCALC 88 08/10/2021   ALT 14 09/20/2021   AST 18 09/20/2021   NA 139 08/23/2021   K 3.6 08/23/2021   CL 100 08/23/2021   CREATININE 1.02  08/23/2021   BUN 16 08/23/2021   CO2 35 (H) 08/23/2021   TSH 4.04 08/10/2021   MICROALBUR 0.2 01/22/2007         Assessment & Plan:   Physical exam: Screening blood work  ordered Exercise  not as much since it has gotten hot - typically walks Weight  ok for age Substance abuse  none   Reviewed recommended immunizations.   Health Maintenance  Topic Date Due   DTaP/Tdap/Td (2 - Td or Tdap) 07/26/2022   COVID-19 Vaccine (7 - 2023-24 season) 01/14/2023 (Originally 06/09/2022)   INFLUENZA VACCINE  02/02/2023   DEXA SCAN  04/17/2023   Medicare Annual Wellness (AWV)  12/26/2023   Pneumonia Vaccine 53+ Years old  Completed   Hepatitis C Screening  Completed   Zoster Vaccines- Shingrix  Completed   HPV VACCINES  Aged Out   Colonoscopy  Discontinued          See Problem List for Assessment and Plan of chronic medical problems.

## 2022-12-28 NOTE — Patient Instructions (Addendum)
An EKG was done.   Blood work was ordered.   The lab is on the first floor.    Medications changes include :   none   A Ct scan of your heart arteries was ordered.    You are due for bone density this October.  I did put in a referral for this for the breast center.     Return in about 1 year (around 12/29/2023) for Physical Exam.   Health Maintenance, Female Adopting a healthy lifestyle and getting preventive care are important in promoting health and wellness. Ask your health care provider about: The right schedule for you to have regular tests and exams. Things you can do on your own to prevent diseases and keep yourself healthy. What should I know about diet, weight, and exercise? Eat a healthy diet  Eat a diet that includes plenty of vegetables, fruits, low-fat dairy products, and lean protein. Do not eat a lot of foods that are high in solid fats, added sugars, or sodium. Maintain a healthy weight Body mass index (BMI) is used to identify weight problems. It estimates body fat based on height and weight. Your health care provider can help determine your BMI and help you achieve or maintain a healthy weight. Get regular exercise Get regular exercise. This is one of the most important things you can do for your health. Most adults should: Exercise for at least 150 minutes each week. The exercise should increase your heart rate and make you sweat (moderate-intensity exercise). Do strengthening exercises at least twice a week. This is in addition to the moderate-intensity exercise. Spend less time sitting. Even light physical activity can be beneficial. Watch cholesterol and blood lipids Have your blood tested for lipids and cholesterol at 77 years of age, then have this test every 5 years. Have your cholesterol levels checked more often if: Your lipid or cholesterol levels are high. You are older than 77 years of age. You are at high risk for heart disease. What  should I know about cancer screening? Depending on your health history and family history, you may need to have cancer screening at various ages. This may include screening for: Breast cancer. Cervical cancer. Colorectal cancer. Skin cancer. Lung cancer. What should I know about heart disease, diabetes, and high blood pressure? Blood pressure and heart disease High blood pressure causes heart disease and increases the risk of stroke. This is more likely to develop in people who have high blood pressure readings or are overweight. Have your blood pressure checked: Every 3-5 years if you are 16-6 years of age. Every year if you are 42 years old or older. Diabetes Have regular diabetes screenings. This checks your fasting blood sugar level. Have the screening done: Once every three years after age 37 if you are at a normal weight and have a low risk for diabetes. More often and at a younger age if you are overweight or have a high risk for diabetes. What should I know about preventing infection? Hepatitis B If you have a higher risk for hepatitis B, you should be screened for this virus. Talk with your health care provider to find out if you are at risk for hepatitis B infection. Hepatitis C Testing is recommended for: Everyone born from [birth year] through 1965. Anyone with known risk factors for hepatitis C. Sexually transmitted infections (STIs) Get screened for STIs, including gonorrhea and chlamydia, if: You are sexually active and are younger than 77 years of  age. Bonita Quin are older than 77 years of age and your health care provider tells you that you are at risk for this type of infection. Your sexual activity has changed since you were last screened, and you are at increased risk for chlamydia or gonorrhea. Ask your health care provider if you are at risk. Ask your health care provider about whether you are at high risk for HIV. Your health care provider may recommend a prescription medicine  to help prevent HIV infection. If you choose to take medicine to prevent HIV, you should first get tested for HIV. You should then be tested every 3 months for as long as you are taking the medicine. Pregnancy If you are about to stop having your period (premenopausal) and you may become pregnant, seek counseling before you get pregnant. Take 400 to 800 micrograms (mcg) of folic acid every day if you become pregnant. Ask for birth control (contraception) if you want to prevent pregnancy. Osteoporosis and menopause Osteoporosis is a disease in which the bones lose minerals and strength with aging. This can result in bone fractures. If you are 42 years old or older, or if you are at risk for osteoporosis and fractures, ask your health care provider if you should: Be screened for bone loss. Take a calcium or vitamin D supplement to lower your risk of fractures. Be given hormone replacement therapy (HRT) to treat symptoms of menopause. Follow these instructions at home: Alcohol use Do not drink alcohol if: Your health care provider tells you not to drink. You are pregnant, may be pregnant, or are planning to become pregnant. If you drink alcohol: Limit how much you have to: 0-1 drink a day. Know how much alcohol is in your drink. In the [place] one drink equals one 12 oz bottle of beer (355 mL), one 5 oz glass of wine (148 mL), or one 1 oz glass of hard liquor (44 mL). Lifestyle Do not use any products that contain nicotine or tobacco. These products include cigarettes, chewing tobacco, and vaping devices, such as e-cigarettes. If you need help quitting, ask your health care provider. Do not use street drugs. Do not share needles. Ask your health care provider for help if you need support or information about quitting drugs. General instructions Schedule regular health, dental, and eye exams. Stay current with your vaccines. Tell your health care provider if: You often feel depressed. You  have ever been abused or do not feel safe at home. Summary Adopting a healthy lifestyle and getting preventive care are important in promoting health and wellness. Follow your health care provider's instructions about healthy diet, exercising, and getting tested or screened for diseases. Follow your health care provider's instructions on monitoring your cholesterol and blood pressure. This information is not intended to replace advice given to you by your health care provider. Make sure you discuss any questions you have with your health care provider. Document Revised: 11/09/2020 Document Reviewed: 11/09/2020 Elsevier Patient Education  2024 [employer].

## 2022-12-29 ENCOUNTER — Encounter: Payer: Self-pay | Admitting: Internal Medicine

## 2022-12-29 ENCOUNTER — Ambulatory Visit (INDEPENDENT_AMBULATORY_CARE_PROVIDER_SITE_OTHER): Payer: Medicare PPO | Admitting: Internal Medicine

## 2022-12-29 VITALS — BP 130/88 | HR 67 | Temp 97.9°F | Ht 62.5 in | Wt 150.0 lb

## 2022-12-29 DIAGNOSIS — E559 Vitamin D deficiency, unspecified: Secondary | ICD-10-CM

## 2022-12-29 DIAGNOSIS — M85852 Other specified disorders of bone density and structure, left thigh: Secondary | ICD-10-CM

## 2022-12-29 DIAGNOSIS — E039 Hypothyroidism, unspecified: Secondary | ICD-10-CM | POA: Diagnosis not present

## 2022-12-29 DIAGNOSIS — Z0000 Encounter for general adult medical examination without abnormal findings: Secondary | ICD-10-CM

## 2022-12-29 DIAGNOSIS — I739 Peripheral vascular disease, unspecified: Secondary | ICD-10-CM | POA: Diagnosis not present

## 2022-12-29 DIAGNOSIS — E782 Mixed hyperlipidemia: Secondary | ICD-10-CM | POA: Diagnosis not present

## 2022-12-29 DIAGNOSIS — Z136 Encounter for screening for cardiovascular disorders: Secondary | ICD-10-CM | POA: Insufficient documentation

## 2022-12-29 LAB — CBC WITH DIFFERENTIAL/PLATELET
Basophils Absolute: 0.1 10*3/uL (ref 0.0–0.1)
Basophils Relative: 0.9 % (ref 0.0–3.0)
Eosinophils Absolute: 0.1 10*3/uL (ref 0.0–0.7)
Eosinophils Relative: 1.6 % (ref 0.0–5.0)
HCT: 41.1 % (ref 36.0–46.0)
Hemoglobin: 13.8 g/dL (ref 12.0–15.0)
Lymphocytes Relative: 26.6 % (ref 12.0–46.0)
Lymphs Abs: 2 10*3/uL (ref 0.7–4.0)
MCHC: 33.6 g/dL (ref 30.0–36.0)
MCV: 92.8 fl (ref 78.0–100.0)
Monocytes Absolute: 0.6 10*3/uL (ref 0.1–1.0)
Monocytes Relative: 7.3 % (ref 3.0–12.0)
Neutro Abs: 4.8 10*3/uL (ref 1.4–7.7)
Neutrophils Relative %: 63.6 % (ref 43.0–77.0)
Platelets: 363 10*3/uL (ref 150.0–400.0)
RBC: 4.43 Mil/uL (ref 3.87–5.11)
RDW: 13.8 % (ref 11.5–15.5)
WBC: 7.6 10*3/uL (ref 4.0–10.5)

## 2022-12-29 LAB — LIPID PANEL
Cholesterol: 199 mg/dL (ref 0–200)
HDL: 57.7 mg/dL (ref >39.00)
NonHDL: 140.96
Total CHOL/HDL Ratio: 3
Triglycerides: 242 mg/dL — ABNORMAL HIGH (ref 0.0–149.0)
VLDL: 48.4 mg/dL — ABNORMAL HIGH (ref 0.0–40.0)

## 2022-12-29 LAB — COMPREHENSIVE METABOLIC PANEL
ALT: 9 U/L (ref 0–35)
AST: 22 U/L (ref 0–37)
Albumin: 4.2 g/dL (ref 3.5–5.2)
Alkaline Phosphatase: 84 U/L (ref 39–117)
BUN: 13 mg/dL (ref 6–23)
CO2: 31 mEq/L (ref 19–32)
Calcium: 10 mg/dL (ref 8.4–10.5)
Chloride: 101 mEq/L (ref 96–112)
Creatinine, Ser: 0.84 mg/dL (ref 0.40–1.20)
GFR: 67.19 mL/min (ref >60.00)
Glucose, Bld: 92 mg/dL (ref 70–99)
Potassium: 4 mEq/L (ref 3.5–5.1)
Sodium: 138 mEq/L (ref 135–145)
Total Bilirubin: 0.4 mg/dL (ref 0.2–1.2)
Total Protein: 7.3 g/dL (ref 6.0–8.3)

## 2022-12-29 LAB — VITAMIN D 25 HYDROXY (VIT D DEFICIENCY, FRACTURES): VITD: 61.99 ng/mL (ref 30.00–100.00)

## 2022-12-29 LAB — TSH: TSH: 0.71 u[IU]/mL (ref 0.35–5.50)

## 2022-12-29 LAB — LDL CHOLESTEROL, DIRECT: Direct LDL: 109 mg/dL

## 2022-12-29 NOTE — Assessment & Plan Note (Signed)
Chronic  Clinically euthyroid Currently taking levothyroxine 100 mcg daily Check tsh  Titrate med dose if needed  

## 2022-12-29 NOTE — Assessment & Plan Note (Signed)
Chronic Taking vitamin D daily Check vitamin D level  

## 2022-12-29 NOTE — Assessment & Plan Note (Signed)
She is concerned about her cardiac risk She does have several risk factors including her age, hyperlipidemia and family history EKG today was normal-see above Will order CT coronary calcium score to evaluate plaque buildup in the heart arteries

## 2022-12-29 NOTE — Assessment & Plan Note (Signed)
Chronic No leg pain with walking Continue aspirin 81 mg daily, pravastatin 20 mg daily

## 2022-12-29 NOTE — Assessment & Plan Note (Signed)
Chronic DEXA up-to-date, due this October-will order Continue calcium and vitamin D Regular exercise

## 2022-12-29 NOTE — Addendum Note (Signed)
Addended by: Pincus Sanes on: 12/29/2022 03:47 PM   Modules accepted: Orders

## 2022-12-29 NOTE — Assessment & Plan Note (Addendum)
Chronic Regular exercise and healthy diet encouraged Check lipid panel  Continue pravastatin 20 mg daily  EKG: NSR at 63 bpm, normal EKG.  No change compared to EKG from 2014  Given increased risk for cardiac disease will pursue CT coronary calcium score

## 2022-12-30 ENCOUNTER — Telehealth: Payer: Self-pay | Admitting: Gastroenterology

## 2022-12-30 NOTE — Telephone Encounter (Signed)
Good Afternoon Dr. Meridee Score,  Patient called stating she had received a letter in the mail to schedule a colonoscopy. Patient stated that she thought she was over the age limit and is wanting you to review her chart to see if the procedure is necessary. Patients last colonoscopy was in 2014 with Dr. Christella Hartigan and has a 10 year recall for 12/03/22 with you. Will you please review and advise on scheduling patient?  Thank you.

## 2023-01-02 NOTE — Telephone Encounter (Signed)
Please send update once we hear from the patient as to how she would like to proceed. Thanks. GM

## 2023-01-03 NOTE — Telephone Encounter (Signed)
Dr. Meridee Score,  I spoke with patient and advised her that we could schedule a office visit and or the procedure, she stated that at this time she does not wish to make an appointment for an office visit or the procedure. She stated that she has not been having any issues and if in the future that she does, she will call to schedule an appointment.

## 2023-01-04 NOTE — Telephone Encounter (Signed)
Thank you for follow-up with this patient. She certainly is able to make her own decisions and as she is between the age of 46-85, individualization of colon cancer screening is reasonable. Were happy to see her back in follow-up to discuss if she would want to undergo endoscopic reevaluation or at this point if she wants to just go ahead and stop colon cancer screening that is okay to and her prerogative. FYI SJB on your patient's decision to forego further colon cancer screening. GM

## 2023-01-17 ENCOUNTER — Other Ambulatory Visit (HOSPITAL_BASED_OUTPATIENT_CLINIC_OR_DEPARTMENT_OTHER): Payer: Self-pay | Admitting: Obstetrics & Gynecology

## 2023-01-17 ENCOUNTER — Other Ambulatory Visit: Payer: Self-pay | Admitting: Internal Medicine

## 2023-01-17 DIAGNOSIS — L292 Pruritus vulvae: Secondary | ICD-10-CM

## 2023-01-18 ENCOUNTER — Other Ambulatory Visit: Payer: Self-pay | Admitting: Urology

## 2023-01-18 DIAGNOSIS — N281 Cyst of kidney, acquired: Secondary | ICD-10-CM

## 2023-01-18 NOTE — Telephone Encounter (Signed)
Called pt to let her know that Dr. Hyacinth Meeker was not comfortable prescribing a medication that has not been filled in the last 8 years. Advised that she could be referred to headache specialist. Pt declines referral at this time.

## 2023-01-22 ENCOUNTER — Other Ambulatory Visit: Payer: Self-pay | Admitting: Internal Medicine

## 2023-01-30 ENCOUNTER — Ambulatory Visit (HOSPITAL_BASED_OUTPATIENT_CLINIC_OR_DEPARTMENT_OTHER)
Admission: RE | Admit: 2023-01-30 | Discharge: 2023-01-30 | Disposition: A | Discharge status: Home / Self Care | Payer: Medicare PPO | Source: Ambulatory Visit | Attending: Internal Medicine | Admitting: Internal Medicine

## 2023-01-30 DIAGNOSIS — Z136 Encounter for screening for cardiovascular disorders: Secondary | ICD-10-CM | POA: Insufficient documentation

## 2023-02-02 ENCOUNTER — Encounter: Payer: Self-pay | Admitting: Internal Medicine

## 2023-02-02 DIAGNOSIS — R931 Abnormal findings on diagnostic imaging of heart and coronary circulation: Secondary | ICD-10-CM | POA: Insufficient documentation

## 2023-02-28 DIAGNOSIS — L821 Other seborrheic keratosis: Secondary | ICD-10-CM | POA: Diagnosis not present

## 2023-02-28 DIAGNOSIS — D225 Melanocytic nevi of trunk: Secondary | ICD-10-CM | POA: Diagnosis not present

## 2023-02-28 DIAGNOSIS — D235 Other benign neoplasm of skin of trunk: Secondary | ICD-10-CM | POA: Diagnosis not present

## 2023-02-28 DIAGNOSIS — L812 Freckles: Secondary | ICD-10-CM | POA: Diagnosis not present

## 2023-02-28 DIAGNOSIS — D2271 Melanocytic nevi of right lower limb, including hip: Secondary | ICD-10-CM | POA: Diagnosis not present

## 2023-03-13 ENCOUNTER — Ambulatory Visit
Admission: RE | Admit: 2023-03-13 | Discharge: 2023-03-13 | Disposition: A | Discharge status: Home / Self Care | Payer: Medicare PPO | Source: Ambulatory Visit | Attending: Urology | Admitting: Urology

## 2023-03-13 DIAGNOSIS — N2889 Other specified disorders of kidney and ureter: Secondary | ICD-10-CM | POA: Diagnosis not present

## 2023-03-13 DIAGNOSIS — N281 Cyst of kidney, acquired: Secondary | ICD-10-CM

## 2023-03-13 MED ORDER — GADOPICLENOL 0.5 MMOL/ML IV SOLN
7.5000 mL | Freq: Once | INTRAVENOUS | Status: AC | PRN
  Administered 2023-03-13: 7.5 mL via INTRAVENOUS

## 2023-03-14 ENCOUNTER — Telehealth: Payer: Self-pay | Admitting: Internal Medicine

## 2023-03-14 DIAGNOSIS — E782 Mixed hyperlipidemia: Secondary | ICD-10-CM

## 2023-03-14 NOTE — Telephone Encounter (Signed)
Patient called asking if based off Dr.Burns notes for the results of her CT scan if a new prescription will be sent in for her pravastatin (PRAVACHOL) 20 MG tablet . Patient would like a callback at [phone].

## 2023-03-16 MED ORDER — ROSUVASTATIN CALCIUM 10 MG PO TABS: 10.0000 mg | ORAL_TABLET | Freq: Every day | ORAL | 3 refills | Status: DC

## 2023-03-16 NOTE — Telephone Encounter (Signed)
Spoke with patient and will call her back next month for lab appointment.

## 2023-03-16 NOTE — Telephone Encounter (Signed)
Lets try crestor 10 mg daily - this is stronger than pravastatin.  Rx sent to [facility].   Recheck lipids in about 6 weeks (ordered)

## 2023-04-04 DIAGNOSIS — N281 Cyst of kidney, acquired: Secondary | ICD-10-CM | POA: Diagnosis not present

## 2023-04-05 ENCOUNTER — Other Ambulatory Visit: Payer: Self-pay | Admitting: Urology

## 2023-04-05 DIAGNOSIS — N281 Cyst of kidney, acquired: Secondary | ICD-10-CM

## 2023-04-07 NOTE — Telephone Encounter (Signed)
Patient called states she got a notice from [facility] about the medication she is on for her cholesterol and how it could cause muscle spasm, and she should report if she is having any symptoms. She states today she was having some issues with her legs. She is not sure if she should keep taking it or if she should go back onto the medicine she was on before. Patient would like a callback to discuss at [phone].

## 2023-04-11 NOTE — Telephone Encounter (Signed)
Pt called returning call for [facility]. Please advise.

## 2023-04-11 NOTE — Telephone Encounter (Signed)
Message left for patient to return call to clinic.  Mychart sent also.

## 2023-04-12 MED ORDER — PRAVASTATIN SODIUM 20 MG PO TABS: 20.0000 mg | ORAL_TABLET | Freq: Every day | ORAL | Status: DC

## 2023-05-23 ENCOUNTER — Ambulatory Visit: Payer: Medicare PPO | Admitting: Family

## 2023-05-23 ENCOUNTER — Other Ambulatory Visit (INDEPENDENT_AMBULATORY_CARE_PROVIDER_SITE_OTHER): Payer: Self-pay

## 2023-05-23 ENCOUNTER — Encounter: Payer: Self-pay | Admitting: Family

## 2023-05-23 DIAGNOSIS — G8929 Other chronic pain: Secondary | ICD-10-CM

## 2023-05-23 DIAGNOSIS — M25561 Pain in right knee: Secondary | ICD-10-CM

## 2023-05-23 MED ORDER — METHYLPREDNISOLONE ACETATE 40 MG/ML IJ SUSP
40.0000 mg | INTRAMUSCULAR | Status: AC | PRN
Start: 2023-05-23 — End: 2023-05-23
  Administered 2023-05-23: 40 mg via INTRA_ARTICULAR

## 2023-05-23 MED ORDER — LIDOCAINE HCL 1 % IJ SOLN
5.0000 mL | INTRAMUSCULAR | Status: AC | PRN
Start: 2023-05-23 — End: 2023-05-23
  Administered 2023-05-23: 5 mL

## 2023-05-23 NOTE — Progress Notes (Signed)
Office Visit Note   Patient: Pamela Rich           Date of Birth: Nov 13, 1945           MRN: [id number] Visit Date: 05/23/2023              Requested by: Pincus Sanes, MD [address] [place],  [place] [address] PCP: Pincus Sanes, MD  Chief Complaint  Patient presents with   Right Knee - Pain      HPI: The patient is a 77 year old woman who presents today for evaluation of right knee pain this has been ongoing since September of this year.  She has not had any associated injury however she reports that after a long ride in the car in September she noticed an increase in her pain.  Medial knee pain no locking no catching no giving way she denies any numbness tingling shooting pain no pain Back or thigh.  She is not having any weakness or heaviness  Assessment & Plan: Visit Diagnoses:  1. Chronic pain of right knee     Plan: Depo-Medrol injection right knee.  Patient will call or return for any worsening.  If she does not get any relief from the injection we may consider MRI of the knee  Follow-Up Instructions: No follow-ups on file.   Right Knee Exam   Muscle Strength  The patient has normal right knee strength.  Tenderness  The patient is experiencing tenderness in the medial joint line.  Range of Motion  The patient has normal right knee ROM.  Tests  Varus: negative Valgus: negative  Other  Erythema: absent Effusion: no effusion present      Patient is alert, oriented, no adenopathy, well-dressed, normal affect, normal respiratory effort.   Imaging: No results found. No images are attached to the encounter.  Labs: Lab Results  Component Value Date   LABORGA ESCHERICHIA COLI 02/07/2017     Lab Results  Component Value Date   ALBUMIN 4.2 12/29/2022   ALBUMIN 4.0 09/20/2021   ALBUMIN 4.4 08/23/2021    No results found for: "MG" Lab Results  Component Value Date   VD25OH 61.99 12/29/2022   VD25OH 60.76 08/04/2020   VD25OH 47.77  05/20/2014    No results found for: "PREALBUMIN"    Latest Ref Rng & Units 12/29/2022   11:43 AM 08/10/2021    1:34 PM 08/04/2020    1:47 PM  CBC EXTENDED  WBC 4.0 - 10.5 K/uL 7.6  6.2  8.7   RBC 3.87 - 5.11 Mil/uL 4.43  4.10  4.37   Hemoglobin 12.0 - 15.0 g/dL 19.1  47.8  29.5   HCT 36.0 - 46.0 % 41.1  38.5  41.0   Platelets 150.0 - 400.0 K/uL 363.0  314.0  345.0   NEUT# 1.4 - 7.7 K/uL 4.8  4.3  5.9   Lymph# 0.7 - 4.0 K/uL 2.0  1.0  2.2      There is no height or weight on file to calculate BMI.  Orders:  Orders Placed This Encounter  Procedures   XR Knee 1-2 Views Right   No orders of the defined types were placed in this encounter.    Procedures: Large Joint Inj: R knee on 05/23/2023 10:49 AM Indications: pain Details: 18 G 1.5 in needle, anteromedial approach Medications: 5 mL lidocaine 1 %; 40 mg methylPREDNISolone acetate 40 MG/ML Consent was given by the patient.      Clinical Data: No  additional findings.  ROS:  All other systems negative, except as noted in the HPI. Review of Systems  Objective: Vital Signs: LMP 07/04/2000   Specialty Comments:  No specialty comments available.  PMFS History: Patient Active Problem List   Diagnosis Date Noted   Agatston coronary artery calcium score 198  (01/2023) 02/02/2023   Screening for heart disease 12/29/2022   Bilateral shoulder bursitis 12/17/2021   Complex renal cyst, left 08/23/2021   COVID-19 05/11/2021   Anxiety 08/04/2020   Medial epicondylitis of elbow, left 11/22/2019   PAD (peripheral artery disease) (HCC) 04/01/2019   De Quervain's syndrome (tenosynovitis) 03/12/2018   Hearing loss, wears hearing aids 06/22/2017   Lumbar radiculopathy 02/28/2017   Chronic pain of both knees 06/21/2016   Mild intermittent asthma 07/20/2015   Other allergic rhinitis 07/20/2015   Mixed hyperlipidemia 03/01/2012   Migraine without aura 04/05/2010   Vitamin D deficiency 03/19/2009   GERD (gastroesophageal reflux  disease) 07/08/2008   DEGENERATIVE JOINT DISEASE 12/05/2007   Hypothyroidism 01/22/2007   IBS 09/28/2006   Osteopenia 09/28/2006   Past Medical History:  Diagnosis Date   Allergy    SEASONAL   Fibroid    no surgery   Heart murmur    Hyperlipidemia    Hypothyroidism    Migraines    Mild intermittent asthma 07/20/2015   pt is currently not taking any medications   Osteopenia    Sciatic nerve lesion 2018   Vitamin D deficiency     Family History  Problem Relation Age of Onset   Hypertension Mother    Heart disease Mother        CHF; AS   Osteoporosis Mother    Heart attack Mother 55   Allergic rhinitis Mother    Diabetes Father    Stroke Father        onset in 80s   Cancer Brother        malignant brain tumor   Colon cancer Neg Hx    Esophageal cancer Neg Hx    Rectal cancer Neg Hx    Stomach cancer Neg Hx    Angioedema Neg Hx    Asthma Neg Hx    Eczema Neg Hx    Immunodeficiency Neg Hx    Urticaria Neg Hx    Breast cancer Neg Hx     Past Surgical History:  Procedure Laterality Date   CATARACT EXTRACTION Bilateral    may 2024   DENTAL SURGERY     DORSAL COMPARTMENT RELEASE Right 03/20/2018   Procedure: RIGHT OPEN  RELEASE DORSAL COMPARTMENT (DEQUERVAINS);  Surgeon: Cindee Salt, MD;  Location: [facility];  Service: Orthopedics;  Laterality: Right;   HYSTEROSCOPY WITH D & C     LUMBAR FUSION     Dr Gerlene Fee   SEPTOPLASTY     SINOSCOPY     TUBAL LIGATION     WRIST ARTHROSCOPY WITH FOVEAL TRIANGULAR FIBROCARTILAGE COMPLEX REPAIR Right 03/20/2018   Procedure: RIGHT WRIST ARTHROSCOPY WITH DEBRIDEMENT, SCAPHOLUNATE/TRIANGULAR FIBROCARTILAGE COMPLEX REPAIR;  Surgeon: Cindee Salt, MD;  Location: [facility];  Service: Orthopedics;  Laterality: Right;   Social History   Occupational History   Occupation: retired  Tobacco Use   Smoking status: Former    Current packs/day: 0.00    Average packs/day: 0.3 packs/day for 40.0 years (12.0  ttl pk-yrs)    Types: Cigarettes    Start date: 07/04/1934    Quit date: 07/04/1974    Years since quitting: 48.9  Smokeless tobacco: Never   Tobacco comments:    smoked 1965-1976 , < 3 cig/ day  Vaping Use   Vaping status: Never Used  Substance and Sexual Activity   Alcohol use: No    Alcohol/week: 0.0 standard drinks of alcohol    Comment: rarely   Drug use: No   Sexual activity: Not Currently    Partners: Male    Birth control/protection: Surgical, Post-menopausal    Comment: BTL

## 2023-05-29 ENCOUNTER — Other Ambulatory Visit (INDEPENDENT_AMBULATORY_CARE_PROVIDER_SITE_OTHER): Payer: Medicare PPO

## 2023-05-29 DIAGNOSIS — E782 Mixed hyperlipidemia: Secondary | ICD-10-CM | POA: Diagnosis not present

## 2023-05-29 LAB — HEPATIC FUNCTION PANEL
ALT: 12 U/L (ref 0–35)
AST: 22 U/L (ref 0–37)
Albumin: 4.3 g/dL (ref 3.5–5.2)
Alkaline Phosphatase: 91 U/L (ref 39–117)
Bilirubin, Direct: 0.1 mg/dL (ref 0.0–0.3)
Total Bilirubin: 0.4 mg/dL (ref 0.2–1.2)
Total Protein: 7.1 g/dL (ref 6.0–8.3)

## 2023-05-29 LAB — LIPID PANEL
Cholesterol: 192 mg/dL (ref 0–200)
HDL: 66.4 mg/dL (ref >39.00)
LDL Cholesterol: 87 mg/dL (ref 0–99)
NonHDL: 125.58
Total CHOL/HDL Ratio: 3
Triglycerides: 192 mg/dL — ABNORMAL HIGH (ref 0.0–149.0)
VLDL: 38.4 mg/dL (ref 0.0–40.0)

## 2023-06-07 ENCOUNTER — Telehealth: Payer: Self-pay

## 2023-06-07 MED ORDER — OMEPRAZOLE 20 MG PO CPDR: 20.0000 mg | DELAYED_RELEASE_CAPSULE | Freq: Every day | ORAL | 5 refills | Status: DC

## 2023-06-07 NOTE — Telephone Encounter (Signed)
Spoke with patient today. 

## 2023-06-07 NOTE — Telephone Encounter (Signed)
A [facility]  Ideally she does not want to take this on a daily basis if she can avoid it because of long-term side effects.

## 2023-06-15 ENCOUNTER — Encounter: Payer: Self-pay | Admitting: Internal Medicine

## 2023-06-15 ENCOUNTER — Other Ambulatory Visit: Payer: Self-pay | Admitting: Internal Medicine

## 2023-06-15 DIAGNOSIS — Z1231 Encounter for screening mammogram for malignant neoplasm of breast: Secondary | ICD-10-CM

## 2023-07-20 ENCOUNTER — Ambulatory Visit
Admission: RE | Admit: 2023-07-20 | Discharge: 2023-07-20 | Disposition: A | Discharge status: Home / Self Care | Payer: Medicare PPO | Source: Ambulatory Visit | Attending: Internal Medicine | Admitting: Internal Medicine

## 2023-07-20 DIAGNOSIS — M85852 Other specified disorders of bone density and structure, left thigh: Secondary | ICD-10-CM

## 2023-07-20 DIAGNOSIS — E2839 Other primary ovarian failure: Secondary | ICD-10-CM | POA: Diagnosis not present

## 2023-07-20 DIAGNOSIS — M8588 Other specified disorders of bone density and structure, other site: Secondary | ICD-10-CM | POA: Diagnosis not present

## 2023-07-20 DIAGNOSIS — N958 Other specified menopausal and perimenopausal disorders: Secondary | ICD-10-CM | POA: Diagnosis not present

## 2023-07-21 ENCOUNTER — Other Ambulatory Visit: Payer: Self-pay | Admitting: Internal Medicine

## 2023-07-22 DIAGNOSIS — M25512 Pain in left shoulder: Secondary | ICD-10-CM | POA: Diagnosis not present

## 2023-07-23 ENCOUNTER — Encounter: Payer: Self-pay | Admitting: Internal Medicine

## 2023-08-02 ENCOUNTER — Ambulatory Visit
Admission: RE | Admit: 2023-08-02 | Discharge: 2023-08-02 | Disposition: A | Discharge status: Home / Self Care | Payer: Medicare PPO | Source: Ambulatory Visit | Attending: Internal Medicine

## 2023-08-02 DIAGNOSIS — Z1231 Encounter for screening mammogram for malignant neoplasm of breast: Secondary | ICD-10-CM

## 2023-08-03 ENCOUNTER — Other Ambulatory Visit: Payer: Self-pay | Admitting: Internal Medicine

## 2023-08-07 ENCOUNTER — Other Ambulatory Visit: Payer: Self-pay | Admitting: Internal Medicine

## 2023-08-07 DIAGNOSIS — R928 Other abnormal and inconclusive findings on diagnostic imaging of breast: Secondary | ICD-10-CM

## 2023-08-17 ENCOUNTER — Ambulatory Visit
Admission: RE | Admit: 2023-08-17 | Discharge: 2023-08-17 | Disposition: A | Discharge status: Home / Self Care | Payer: Medicare PPO | Source: Ambulatory Visit | Attending: Internal Medicine

## 2023-08-17 ENCOUNTER — Ambulatory Visit
Admission: RE | Admit: 2023-08-17 | Discharge: 2023-08-17 | Disposition: A | Discharge status: Home / Self Care | Payer: Medicare PPO | Source: Ambulatory Visit | Attending: Internal Medicine | Admitting: Internal Medicine

## 2023-08-17 DIAGNOSIS — R928 Other abnormal and inconclusive findings on diagnostic imaging of breast: Secondary | ICD-10-CM

## 2023-08-28 ENCOUNTER — Telehealth: Payer: Self-pay | Admitting: Internal Medicine

## 2023-08-28 MED ORDER — ONDANSETRON 4 MG PO TBDP: 4.0000 mg | ORAL_TABLET | Freq: Three times a day (TID) | ORAL | 0 refills | Status: DC | PRN

## 2023-08-28 NOTE — Telephone Encounter (Signed)
 Spoke with patient and scheduled her to see [facility] tomorrow for sinus infection

## 2023-08-28 NOTE — Telephone Encounter (Signed)
 Caller & Relationship to patient: Self  Call back number: [phone]   Date of last office visit: 9.10.24  Date of next office visit: 7.3.25  Medication(s) to be refilled:  ondansetron (ZOFRAN) 4 MG tablet   [facility]erred [facility]acy:  [id number]   Phone: [phone]  Fax: [phone]   Pt has been feeling nauseous lately and has thrown up a few times, no other sxs. Pt remembers taking this medication before and it helped with this problem.

## 2023-08-28 NOTE — Telephone Encounter (Signed)
 Sent to pharmacy - if not improving should be seen

## 2023-08-29 ENCOUNTER — Ambulatory Visit: Payer: Medicare PPO | Admitting: Family Medicine

## 2023-08-29 ENCOUNTER — Encounter: Payer: Self-pay | Admitting: Family Medicine

## 2023-08-29 VITALS — BP 126/80 | HR 71 | Temp 97.9°F | Ht 62.5 in | Wt 152.0 lb

## 2023-08-29 DIAGNOSIS — R630 Anorexia: Secondary | ICD-10-CM

## 2023-08-29 DIAGNOSIS — E039 Hypothyroidism, unspecified: Secondary | ICD-10-CM | POA: Diagnosis not present

## 2023-08-29 DIAGNOSIS — R6883 Chills (without fever): Secondary | ICD-10-CM

## 2023-08-29 DIAGNOSIS — R112 Nausea with vomiting, unspecified: Secondary | ICD-10-CM | POA: Diagnosis not present

## 2023-08-29 DIAGNOSIS — R519 Headache, unspecified: Secondary | ICD-10-CM | POA: Diagnosis not present

## 2023-08-29 LAB — POCT URINALYSIS DIPSTICK
Bilirubin, UA: NEGATIVE
Blood, UA: NEGATIVE
Glucose, UA: NEGATIVE
Ketones, UA: NEGATIVE
Leukocytes, UA: NEGATIVE
Nitrite, UA: NEGATIVE
Protein, UA: NEGATIVE
Spec Grav, UA: 1.01 (ref 1.010–1.025)
Urobilinogen, UA: 0.2 U/dL
pH, UA: 6 (ref 5.0–8.0)

## 2023-08-29 LAB — CBC WITH DIFFERENTIAL/PLATELET
Basophils Absolute: 0 10*3/uL (ref 0.0–0.1)
Basophils Relative: 0.6 % (ref 0.0–3.0)
Eosinophils Absolute: 0.4 10*3/uL (ref 0.0–0.7)
Eosinophils Relative: 4.8 % (ref 0.0–5.0)
HCT: 40.6 % (ref 36.0–46.0)
Hemoglobin: 13.5 g/dL (ref 12.0–15.0)
Lymphocytes Relative: 29.8 % (ref 12.0–46.0)
Lymphs Abs: 2.4 10*3/uL (ref 0.7–4.0)
MCHC: 33.2 g/dL (ref 30.0–36.0)
MCV: 94.6 fL (ref 78.0–100.0)
Monocytes Absolute: 0.9 10*3/uL (ref 0.1–1.0)
Monocytes Relative: 11 % (ref 3.0–12.0)
Neutro Abs: 4.3 10*3/uL (ref 1.4–7.7)
Neutrophils Relative %: 53.8 % (ref 43.0–77.0)
Platelets: 401 10*3/uL — ABNORMAL HIGH (ref 150.0–400.0)
RBC: 4.29 Mil/uL (ref 3.87–5.11)
RDW: 13.7 % (ref 11.5–15.5)
WBC: 8 10*3/uL (ref 4.0–10.5)

## 2023-08-29 LAB — COMPREHENSIVE METABOLIC PANEL
ALT: 10 U/L (ref 0–35)
AST: 24 U/L (ref 0–37)
Albumin: 4.2 g/dL (ref 3.5–5.2)
Alkaline Phosphatase: 92 U/L (ref 39–117)
BUN: 10 mg/dL (ref 6–23)
CO2: 31 meq/L (ref 19–32)
Calcium: 9.4 mg/dL (ref 8.4–10.5)
Chloride: 102 meq/L (ref 96–112)
Creatinine, Ser: 0.83 mg/dL (ref 0.40–1.20)
GFR: 67.85 mL/min (ref >60.00)
Glucose, Bld: 95 mg/dL (ref 70–99)
Potassium: 4.1 meq/L (ref 3.5–5.1)
Sodium: 140 meq/L (ref 135–145)
Total Bilirubin: 0.3 mg/dL (ref 0.2–1.2)
Total Protein: 7.3 g/dL (ref 6.0–8.3)

## 2023-08-29 LAB — LIPASE: Lipase: 11 U/L (ref 11.0–59.0)

## 2023-08-29 LAB — AMYLASE: Amylase: 55 U/L (ref 27–131)

## 2023-08-29 LAB — POC COVID19 BINAXNOW: SARS Coronavirus 2 Ag: NEGATIVE

## 2023-08-29 LAB — POCT INFLUENZA A/B
Influenza A, POC: NEGATIVE
Influenza B, POC: NEGATIVE

## 2023-08-29 LAB — TSH: TSH: 0.78 u[IU]/mL (ref 0.35–5.50)

## 2023-08-29 NOTE — Patient Instructions (Signed)
 Continue supportive care at home, make sure you are drinking plenty of fluids and getting some rest.  May take ibuprofen and Tylenol as needed for fever, aches and pains.  May continue OTC cough and cold medications as needed with benefit.   We are checking labs today, will be in contact with any results that require further attention  Please let me know if you have another episode of acute headache and vomiting.  Follow-up with me if symptoms are persisting over the next week.

## 2023-08-29 NOTE — Progress Notes (Signed)
 Acute Office Visit  Subjective:     Patient ID: Pamela Rich, female    DOB: 1946-01-10, 78 y.o.   MRN: [id number]  Chief Complaint  Patient presents with   Acute Visit    Has been feeling nauseous lately,    HPI Patient is in today for evaluation of 2 episodes of headache with vomiting after eating french fries about a month ago. After vomiting, HA resolved. 4 days ago, had another episode of severe headache and vomiting. Reports that the headache stayed with her about 24 hours this time.  Woke up nauseated yesterday. Used zofran and was able to eat yesterday. Took tylenol for HA. Has had decreased appetite for the last 4 days. Frontal head pain. Has had flu vaccine.  Denies known sick contacts. Denies abdominal pain, diarrhea, rash, fever, chills, other symptoms.  Medical hx as outlined below.  ROS Per HPI      Objective:    BP 126/80 (BP Location: Left Arm, Patient Position: Sitting)   Pulse 71   Temp 97.9 F (36.6 C) (Temporal)   Ht 5' 2.5" (1.588 m)   Wt 152 lb (68.9 kg)   LMP 07/04/2000   SpO2 94%   BMI 27.36 kg/m    Physical Exam Vitals and nursing note reviewed.  Constitutional:      General: She is not in acute distress. HENT:     Head: Normocephalic and atraumatic.     Right Ear: Tympanic membrane and ear canal normal.     Left Ear: Tympanic membrane and ear canal normal.     Nose: No congestion.     Mouth/Throat:     Mouth: Mucous membranes are moist.     Pharynx: Oropharynx is clear. No oropharyngeal exudate or posterior oropharyngeal erythema.  Eyes:     Extraocular Movements: Extraocular movements intact.  Cardiovascular:     Rate and Rhythm: Normal rate and regular rhythm.  Pulmonary:     Effort: Pulmonary effort is normal. No respiratory distress.  Abdominal:     General: There is no distension.     Palpations: There is no mass.     Tenderness: There is no abdominal tenderness. There is no right CVA tenderness, left CVA tenderness,  guarding or rebound.     Hernia: No hernia is present.  Musculoskeletal:        General: Normal range of motion.     Cervical back: Normal range of motion and neck supple.  Lymphadenopathy:     Cervical: No cervical adenopathy.  Skin:    General: Skin is warm and dry.  Neurological:     General: No focal deficit present.     Mental Status: She is alert and oriented to person, place, and time.  Psychiatric:        Mood and Affect: Mood normal.        Behavior: Behavior normal.    Results for orders placed or performed in visit on 08/29/23  POC COVID-19 BinaxNow  Result Value Ref Range   SARS Coronavirus 2 Ag Negative Negative  POCT Influenza A/B  Result Value Ref Range   Influenza A, POC Negative Negative   Influenza B, POC Negative Negative  POCT urinalysis dipstick  Result Value Ref Range   Color, UA yellow    Clarity, UA clear    Glucose, UA Negative Negative   Bilirubin, UA negative    Ketones, UA negative    Spec Grav, UA 1.010 1.010 - 1.025   Blood, UA  negative    pH, UA 6.0 5.0 - 8.0   Protein, UA Negative Negative   Urobilinogen, UA 0.2 0.2 or 1.0 E.U./dL   Nitrite, UA negative    Leukocytes, UA Negative Negative   Appearance clear    Odor Present         Assessment & Plan:  1. Nausea and vomiting, unspecified vomiting type (Primary)  - POC COVID-19 BinaxNow - POCT Influenza A/B - CBC with Differential/Platelet - Comprehensive metabolic panel - Lipase - Amylase - POCT urinalysis dipstick - TSH - Continue Zofran prn  2. Nonintractable headache, unspecified chronicity pattern, unspecified headache type  - POC COVID-19 BinaxNow - POCT Influenza A/B - POCT urinalysis dipstick - TSH  3. Decreased appetite  - POC COVID-19 BinaxNow - POCT Influenza A/B - CBC with Differential/Platelet - Comprehensive metabolic panel - Lipase - Amylase - POCT urinalysis dipstick  4. Chills  - POC COVID-19 BinaxNow - POCT Influenza A/B - CBC with  Differential/Platelet - POCT urinalysis dipstick  5. Acquired hypothyroidism  - TSH   No orders of the defined types were placed in this encounter.   Return if symptoms worsen or fail to improve.  Moshe Cipro, FNP

## 2023-08-30 ENCOUNTER — Encounter: Payer: Self-pay | Admitting: Family Medicine

## 2023-10-02 ENCOUNTER — Encounter: Payer: Self-pay | Admitting: Internal Medicine

## 2023-10-02 ENCOUNTER — Ambulatory Visit: Admitting: Internal Medicine

## 2023-10-02 VITALS — BP 122/78 | HR 70 | Temp 97.6°F | Ht 62.5 in | Wt 150.0 lb

## 2023-10-02 DIAGNOSIS — R300 Dysuria: Secondary | ICD-10-CM | POA: Diagnosis not present

## 2023-10-02 DIAGNOSIS — N3001 Acute cystitis with hematuria: Secondary | ICD-10-CM | POA: Diagnosis not present

## 2023-10-02 DIAGNOSIS — N3000 Acute cystitis without hematuria: Secondary | ICD-10-CM | POA: Insufficient documentation

## 2023-10-02 LAB — POC URINALSYSI DIPSTICK (AUTOMATED)
Bilirubin, UA: NEGATIVE
Blood, UA: POSITIVE
Glucose, UA: NEGATIVE
Ketones, UA: NEGATIVE
Nitrite, UA: NEGATIVE
Protein, UA: NEGATIVE
Spec Grav, UA: 1.01 (ref 1.010–1.025)
Urobilinogen, UA: 0.2 U/dL
pH, UA: 6 (ref 5.0–8.0)

## 2023-10-02 MED ORDER — CEPHALEXIN 500 MG PO CAPS: 500.0000 mg | ORAL_CAPSULE | Freq: Two times a day (BID) | ORAL | 0 refills | Status: DC

## 2023-10-02 NOTE — Patient Instructions (Signed)
Take the antibiotic as prescribed.  Take tylenol if needed.     Increase your water intake.   Call if no improvement     Urinary Tract Infection, Adult A urinary tract infection (UTI) is an infection of any part of the urinary tract, which includes the kidneys, ureters, bladder, and urethra. These organs make, store, and get rid of urine in the body. UTI can be a bladder infection (cystitis) or kidney infection (pyelonephritis). What are the causes? This infection may be caused by fungi, viruses, or bacteria. Bacteria are the most common cause of UTIs. This condition can also be caused by repeated incomplete emptying of the bladder during urination. What increases the risk? This condition is more likely to develop if:  You ignore your need to urinate or hold urine for long periods of time.  You do not empty your bladder completely during urination.  You wipe back to front after urinating or having a bowel movement, if you are female.  You are uncircumcised, if you are female.  You are constipated.  You have a urinary catheter that stays in place (indwelling).  You have a weak defense (immune) system.  You have a medical condition that affects your bowels, kidneys, or bladder.  You have diabetes.  You take antibiotic medicines frequently or for long periods of time, and the antibiotics no longer work well against certain types of infections (antibiotic resistance).  You take medicines that irritate your urinary tract.  You are exposed to chemicals that irritate your urinary tract.  You are female.  What are the signs or symptoms? Symptoms of this condition include:  Fever.  Frequent urination or passing small amounts of urine frequently.  Needing to urinate urgently.  Pain or burning with urination.  Urine that smells bad or unusual.  Cloudy urine.  Pain in the lower abdomen or back.  Trouble urinating.  Blood in the urine.  Vomiting or being less hungry than  normal.  Diarrhea or abdominal pain.  Vaginal discharge, if you are female.  How is this diagnosed? This condition is diagnosed with a medical history and physical exam. You will also need to provide a urine sample to test your urine. Other tests may be done, including:  Blood tests.  Sexually transmitted disease (STD) testing.  If you have had more than one UTI, a cystoscopy or imaging studies may be done to determine the cause of the infections. How is this treated? Treatment for this condition often includes a combination of two or more of the following:  Antibiotic medicine.  Other medicines to treat less common causes of UTI.  Over-the-counter medicines to treat pain.  Drinking enough water to stay hydrated.  Follow these instructions at home:  Take over-the-counter and prescription medicines only as told by your health care provider.  If you were prescribed an antibiotic, take it as told by your health care provider. Do not stop taking the antibiotic even if you start to feel better.  Avoid alcohol, caffeine, tea, and carbonated beverages. They can irritate your bladder.  Drink enough fluid to keep your urine clear or pale yellow.  Keep all follow-up visits as told by your health care provider. This is important.  Make sure to: ? Empty your bladder often and completely. Do not hold urine for long periods of time. ? Empty your bladder before and after sex. ? Wipe from front to back after a bowel movement if you are female. Use each tissue one time when you   wipe. Contact a health care provider if:  You have back pain.  You have a fever.  You feel nauseous or vomit.  Your symptoms do not get better after 3 days.  Your symptoms go away and then return. Get help right away if:  You have severe back pain or lower abdominal pain.  You are vomiting and cannot keep down any medicines or water. This information is not intended to replace advice given to you by  your health care provider. Make sure you discuss any questions you have with your health care provider. Document Released: 03/30/2005 Document Revised: 12/02/2015 Document Reviewed: 05/11/2015 Elsevier Interactive Patient Education  2018 [employer]vier Inc.   

## 2023-10-02 NOTE — Assessment & Plan Note (Signed)
 Acute Urine dip consistent with UTI Will send urine for culture Take the antibiotic as prescribed.  Keflex 500 mg twice daily x 7 days Can take Azo for pain If she has another UTI we will consider vaginal estrogen Take tylenol if needed.   Increase your water intake.  Call if no improvement

## 2023-10-02 NOTE — Progress Notes (Signed)
 Subjective:    Patient ID: Pamela Rich, female    DOB: 07/23/1945, 78 y.o.   MRN: [id number]      HPI Pamela Rich is here for  Chief Complaint  Patient presents with   Dysuria    Burning w urination since Saturday afternoon, doing a little better today after taking some cranberry tablets    For the past two days she has had difficulty urinating, pain with urination, urinary frequency and urgency, blood in the urine and some lower abdominal pain.  She does have history of UTI-her last 1 was about 1 year ago.  She did start taking some cranberry pills and thinks that may have helped a little.     Medications and allergies reviewed with patient and updated if appropriate.  Current Outpatient Medications on File Prior to Visit  Medication Sig Dispense Refill   albuterol (PROAIR HFA) 108 (90 Base) MCG/ACT inhaler INHALE 2 PUFFS EVERY 4 HOURS AS NEEDED FOR WHEEZING OR COUGHING SPELLS 18 g 2   Ascorbic Acid (VITAMIN C) 1000 MG tablet Take 1,000 mg by mouth daily.     aspirin 81 MG tablet Take 81 mg by mouth daily.     Azelastine-Fluticasone 137-50 MCG/ACT SUSP Place 2 sprays into both nostrils in the morning and at bedtime. 23 g 11   Cholecalciferol (VITAMIN D3) 1000 UNITS CAPS Take by mouth daily.     Fluticasone Furoate (ARNUITY ELLIPTA) 100 MCG/ACT AEPB Inhale 1 puff into the lungs daily. Use per asthma action plan. 30 each 3   folic acid (FOLVITE) 1 MG tablet Take 1 mg by mouth daily.     levothyroxine (SYNTHROID) 50 MCG tablet TAKE 2 TABLETS BY MOUTH EVERY DAY 180 tablet 1   Multiple Vitamin (MULTIVITAMIN) tablet Take by mouth.     omeprazole (PRILOSEC) 20 MG capsule Take 1 capsule (20 mg total) by mouth daily. 30 capsule 5   ondansetron (ZOFRAN-ODT) 4 MG disintegrating tablet Take 1 tablet (4 mg total) by mouth every 8 (eight) hours as needed for nausea or vomiting. 20 tablet 0   pravastatin (PRAVACHOL) 20 MG tablet TAKE 1 TABLET BY MOUTH EVERY DAY 90 tablet 1   No current  facility-administered medications on file prior to visit.    Review of Systems  Constitutional:  Negative for fever.       Feels hot or cold  Gastrointestinal:  Positive for abdominal pain (lower abdomen). Negative for nausea.  Genitourinary:  Positive for difficulty urinating, dysuria, frequency, hematuria and urgency.  Musculoskeletal:  Negative for back pain.       Objective:   Vitals:   10/02/23 1603  BP: 122/78  Pulse: 70  Temp: 97.6 F (36.4 C)  SpO2: 97%   BP Readings from Last 3 Encounters:  10/02/23 122/78  08/29/23 126/80  12/29/22 130/88   Wt Readings from Last 3 Encounters:  10/02/23 150 lb (68 kg)  08/29/23 152 lb (68.9 kg)  12/29/22 150 lb (68 kg)   Body mass index is 27 kg/m.    Physical Exam Constitutional:      General: She is not in acute distress.    Appearance: Normal appearance. She is not ill-appearing.  HENT:     Head: Normocephalic.  Eyes:     Conjunctiva/sclera: Conjunctivae normal.  Abdominal:     General: There is no distension.     Palpations: Abdomen is soft.     Tenderness: There is no abdominal tenderness. There is no right CVA tenderness, left  CVA tenderness, guarding or rebound.  Skin:    General: Skin is warm and dry.  Neurological:     Mental Status: She is alert.            Assessment & Plan:    See Problem List for Assessment and Plan of chronic medical problems.

## 2023-10-04 LAB — URINE CULTURE

## 2023-10-05 ENCOUNTER — Encounter: Payer: Self-pay | Admitting: Internal Medicine

## 2023-11-09 DIAGNOSIS — J029 Acute pharyngitis, unspecified: Secondary | ICD-10-CM | POA: Diagnosis not present

## 2023-11-09 DIAGNOSIS — B370 Candidal stomatitis: Secondary | ICD-10-CM | POA: Diagnosis not present

## 2023-11-23 ENCOUNTER — Other Ambulatory Visit: Payer: Self-pay | Admitting: Internal Medicine

## 2023-12-08 ENCOUNTER — Other Ambulatory Visit: Payer: Self-pay | Admitting: Internal Medicine

## 2023-12-18 ENCOUNTER — Ambulatory Visit: Admitting: Internal Medicine

## 2023-12-18 ENCOUNTER — Encounter: Payer: Self-pay | Admitting: Internal Medicine

## 2023-12-18 ENCOUNTER — Other Ambulatory Visit: Payer: Self-pay

## 2023-12-18 VITALS — BP 122/78 | HR 67 | Temp 97.3°F | Resp 16 | Ht 62.5 in | Wt 151.1 lb

## 2023-12-18 DIAGNOSIS — J3089 Other allergic rhinitis: Secondary | ICD-10-CM | POA: Diagnosis not present

## 2023-12-18 DIAGNOSIS — J45909 Unspecified asthma, uncomplicated: Secondary | ICD-10-CM | POA: Diagnosis not present

## 2023-12-18 DIAGNOSIS — J4520 Mild intermittent asthma, uncomplicated: Secondary | ICD-10-CM | POA: Diagnosis not present

## 2023-12-18 DIAGNOSIS — J45998 Other asthma: Secondary | ICD-10-CM | POA: Diagnosis not present

## 2023-12-18 MED ORDER — SPACER/AERO-HOLDING CHAMBERS DEVI: 1.0000 | 1 refills | Status: DC | PRN

## 2023-12-18 MED ORDER — FLUTICASONE PROPIONATE HFA 110 MCG/ACT IN AERO: INHALATION_SPRAY | RESPIRATORY_TRACT | 5 refills | Status: AC

## 2023-12-18 MED ORDER — AZELASTINE-FLUTICASONE 137-50 MCG/ACT NA SUSP: 2.0000 | Freq: Two times a day (BID) | NASAL | 11 refills | Status: AC | PRN

## 2023-12-18 MED ORDER — SULFAMETHOXAZOLE-TRIMETHOPRIM 800-160 MG PO TABS: 1.0000 | ORAL_TABLET | Freq: Two times a day (BID) | ORAL | 0 refills | Status: AC

## 2023-12-18 MED ORDER — IPRATROPIUM BROMIDE 0.03 % NA SOLN: 1.0000 | Freq: Every evening | NASAL | 12 refills | Status: AC | PRN

## 2023-12-18 NOTE — Patient Instructions (Addendum)
 Mild intermittent asthma Continue albuterol  HFA, 1-2 inhalations every 4-6 hours if needed. During respiratory tract infections or asthma flares, add Flovent  110 g 2 inhalation twice daily for until 1-2 weeks until symptoms have returned to baseline.  Rinse mouth out after use.  Use with spacer. Use with spacer-spacer sent to pharmacy.  Other allergic rhinitis Continue appropriate allergen avoidance measures. Continue azelastine /fluticasone  nasal spray if needed. Start atrovent 0.03% 1 spray in each nostril at night if needed-will dry you out so be careful not to overuse. Bactrim  800 mg twice daily for 10 days - take with probiotics for suspected sinus infection. Nasal saline spray (i.e., Simply Saline) or nasal saline lavage (i.e., NeilMed) is recommended as needed and prior to medicated nasal sprays.   Return in about 12 months, sooner if needed.  It was a pleasure seeing you again in clinic today! Thank you for allowing me to participate in your care.  Jonathon Neighbors, MD [facility]

## 2023-12-18 NOTE — Progress Notes (Signed)
 FOLLOW UP Date of Service/Encounter:   12/18/2023  Subjective:  Pamela Rich (DOB: 1945-08-06) is a 78 y.o. female who [facility] in re-evaluation of the following: Intermittent asthma, allergic rhinitis History obtained from: chart review and patient.  For Review, LV was on 05-22/24 with  seen for routine follow-up. See below for summary of history and diagnostics.   Therapeutic plans/changes recommended: FEV1 84%, using Arnuity during spring time or allergy flares.  Dymista  if needed.  Overall controlled. ----------------------------------------------------- Pertinent History/Diagnostics:  Asthma: mild intermittent Uses flovent  110, 2 puffs BID in springtime only. Switched to arnuity due to insurance.  - pre/post spirometry (2017): mild reduction in the forced vital capacity and FEV1 with significant improvement in the FEV1 after albuterol    - AEC 300 on 08/10/21 Allergic Rhinitis:  Controlled on fluticasone /azelastine  spray  - SPT environmental panel (07/20/15): + mold on IDs only - She has had nasal surgery by Dr. Franklin Ito in 1979.  No longer actively followed by ENT --------------------------------------------------- Today presents for follow-up. Since her last visit, she reports that this spring was difficult for her.  She did try to use her Arnuity for several weeks at the beginning of spring.  However this resulted in a sore throat and she was subsequently diagnosed with thrush.  She was treated for this, and she has not used it since.  She feels that she has had a congestive cough with rattling in her chest for several weeks now.  She uses her nasal saline spray multiple times a day and uses her azelastine /fluticasone  nasal spray twice daily which is helpful.  However, she continues to have drainage going down the back of her throat.  She has been using her albuterol  as needed, but this has been infrequent.  She has not been  prescribed steroids or antibiotics since her last visit.  She denies any fevers or other sick symptoms.  All medications reviewed by clinical staff and updated in chart. No new pertinent medical or surgical history except as noted in HPI.  ROS: All others negative except as noted per HPI.   Objective:  BP 122/78 (BP Location: Right Arm, Patient Position: Sitting, Cuff Size: Normal)   Pulse 67   Temp (!) 97.3 F (36.3 C) (Temporal)   Resp 16   Ht 5' 2.5 (1.588 m)   Wt 151 lb 1.6 oz (68.5 kg)   LMP 07/04/2000   SpO2 95%   BMI 27.20 kg/m  Body mass index is 27.2 kg/m. Physical Exam: General Appearance:  Alert, cooperative, no distress, appears stated age  Head:  Normocephalic, without obvious abnormality, atraumatic  Eyes:  Conjunctiva clear, EOM's intact  Ears EACs normal bilaterally and normal TMs bilaterally  Nose: Nares normal, hypertrophic turbinates, normal mucosa, and no visible anterior polyps  Throat: Lips, tongue normal; teeth and gums normal, normal posterior oropharynx  Neck: Supple, symmetrical  Lungs:   clear to auscultation bilaterally, Respirations unlabored, no coughing  Heart:  regular rate and rhythm and no murmur, Appears well perfused  Extremities: No edema  Skin: Skin color, texture, turgor normal and no rashes or lesions on visualized portions of skin  Neurologic: No gross deficits   Labs:  Lab Orders  No laboratory test(s) ordered today    Spirometry:  Tracings reviewed. Her effort: Good reproducible efforts. FVC: 2.44L FEV1: 1.76L, 92% predicted FEV1/FVC ratio: 0.72 Interpretation: Spirometry consistent with normal pattern.  Please see scanned spirometry results for details.  Assessment/Plan  Lung sound clear, spirometry look good.  Considered protracted bacterial bronchitis versus sinus infection.  Will send in prescription of Bactrim  due to chronicity of symptoms.  Will add Atrovent nasal spray to help with drainage.  Educated on overuse of  Atrovent leading to worsening nasal dryness.  Switching back to Flovent  as it looks to be covered now which she can use with a spacer.  This should cut down on potential thrush.  Mild intermittent asthma-at goal Continue albuterol  HFA, 1-2 inhalations every 4-6 hours if needed. During respiratory tract infections or asthma flares, add Flovent  110 g 2 inhalation twice daily for until 1-2 weeks until symptoms have returned to baseline.  Rinse mouth out after use.  Use with spacer. Use with spacer-spacer sent to pharmacy.  Other allergic rhinitis-not at goal Continue appropriate allergen avoidance measures. Continue azelastine /fluticasone  nasal spray if needed. Start atrovent 0.03% 1 spray in each nostril at night if needed-will dry you out so be careful not to overuse. Bactrim  800 mg twice daily for 10 days - take with probiotics for suspected sinus infection. Nasal saline spray (i.e., Simply Saline) or nasal saline lavage (i.e., NeilMed) is recommended as needed and prior to medicated nasal sprays.   Return in about 12 months, sooner if needed.  It was a pleasure seeing you again in clinic today! Thank you for allowing me to participate in your care. Other: spacer provided in clinic.  Jonathon Neighbors, MD  [facility] 

## 2023-12-19 ENCOUNTER — Other Ambulatory Visit: Payer: Self-pay | Admitting: Internal Medicine

## 2023-12-31 ENCOUNTER — Encounter: Payer: Self-pay | Admitting: Internal Medicine

## 2023-12-31 NOTE — Patient Instructions (Addendum)
 Blood work was ordered.       Medications changes include :   fosamax 70 mg once a week, increase pravastatin  to 40 mg daily   For constipation - try a combination for the following Docusate 1-3 pills a day Probiotics Metamucil fiber or Benefber miralax - which you can take daily if needed Smooth move tea Prunes, prune juice, dates or raisons Magnesium supplement over the counter Drinks lot of water   Return in about 6 months (around 07/02/2024) for follow up.    Health Maintenance, Female Adopting a healthy lifestyle and getting preventive care are important in promoting health and wellness. Ask your health care provider about: The right schedule for you to have regular tests and exams. Things you can do on your own to prevent diseases and keep yourself healthy. What should I know about diet, weight, and exercise? Eat a healthy diet  Eat a diet that includes plenty of vegetables, fruits, low-fat dairy products, and lean protein. Do not eat a lot of foods that are high in solid fats, added sugars, or sodium. Maintain a healthy weight Body mass index (BMI) is used to identify weight problems. It estimates body fat based on height and weight. Your health care provider can help determine your BMI and help you achieve or maintain a healthy weight. Get regular exercise Get regular exercise. This is one of the most important things you can do for your health. Most adults should: Exercise for at least 150 minutes each week. The exercise should increase your heart rate and make you sweat (moderate-intensity exercise). Do strengthening exercises at least twice a week. This is in addition to the moderate-intensity exercise. Spend less time sitting. Even light physical activity can be beneficial. Watch cholesterol and blood lipids Have your blood tested for lipids and cholesterol at 78 years of age, then have this test every 5 years. Have your cholesterol levels checked more  often if: Your lipid or cholesterol levels are high. You are older than 78 years of age. You are at high risk for heart disease. What should I know about cancer screening? Depending on your health history and family history, you may need to have cancer screening at various ages. This may include screening for: Breast cancer. Cervical cancer. Colorectal cancer. Skin cancer. Lung cancer. What should I know about heart disease, diabetes, and high blood pressure? Blood pressure and heart disease High blood pressure causes heart disease and increases the risk of stroke. This is more likely to develop in people who have high blood pressure readings or are overweight. Have your blood pressure checked: Every 3-5 years if you are 58-83 years of age. Every year if you are 13 years old or older. Diabetes Have regular diabetes screenings. This checks your fasting blood sugar level. Have the screening done: Once every three years after age 59 if you are at a normal weight and have a low risk for diabetes. More often and at a younger age if you are overweight or have a high risk for diabetes. What should I know about preventing infection? Hepatitis B If you have a higher risk for hepatitis B, you should be screened for this virus. Talk with your health care provider to find out if you are at risk for hepatitis B infection. Hepatitis C Testing is recommended for: Everyone born from [birth year] through 1965. Anyone with known risk factors for hepatitis C. Sexually transmitted infections (STIs) Get screened for STIs, including gonorrhea and chlamydia,  if: You are sexually active and are younger than 78 years of age. You are older than 78 years of age and your health care provider tells you that you are at risk for this type of infection. Your sexual activity has changed since you were last screened, and you are at increased risk for chlamydia or gonorrhea. Ask your health care provider if you are at  risk. Ask your health care provider about whether you are at high risk for HIV. Your health care provider may recommend a prescription medicine to help prevent HIV infection. If you choose to take medicine to prevent HIV, you should first get tested for HIV. You should then be tested every 3 months for as long as you are taking the medicine. Pregnancy If you are about to stop having your period (premenopausal) and you may become pregnant, seek counseling before you get pregnant. Take 400 to 800 micrograms (mcg) of folic acid every day if you become pregnant. Ask for birth control (contraception) if you want to prevent pregnancy. Osteoporosis and menopause Osteoporosis is a disease in which the bones lose minerals and strength with aging. This can result in bone fractures. If you are 45 years old or older, or if you are at risk for osteoporosis and fractures, ask your health care provider if you should: Be screened for bone loss. Take a calcium  or vitamin D  supplement to lower your risk of fractures. Be given hormone replacement therapy (HRT) to treat symptoms of menopause. Follow these instructions at home: Alcohol use Do not drink alcohol if: Your health care provider tells you not to drink. You are pregnant, may be pregnant, or are planning to become pregnant. If you drink alcohol: Limit how much you have to: 0-1 drink a day. Know how much alcohol is in your drink. In the [place] one drink equals one 12 oz bottle of beer (355 mL), one 5 oz glass of wine (148 mL), or one 1 oz glass of hard liquor (44 mL). Lifestyle Do not use any products that contain nicotine or tobacco. These products include cigarettes, chewing tobacco, and vaping devices, such as e-cigarettes. If you need help quitting, ask your health care provider. Do not use street drugs. Do not share needles. Ask your health care provider for help if you need support or information about quitting drugs. General instructions Schedule  regular health, dental, and eye exams. Stay current with your vaccines. Tell your health care provider if: You often feel depressed. You have ever been abused or do not feel safe at home. Summary Adopting a healthy lifestyle and getting preventive care are important in promoting health and wellness. Follow your health care provider's instructions about healthy diet, exercising, and getting tested or screened for diseases. Follow your health care provider's instructions on monitoring your cholesterol and blood pressure. This information is not intended to replace advice given to you by your health care provider. Make sure you discuss any questions you have with your health care provider. Document Revised: 11/09/2020 Document Reviewed: 11/09/2020 Elsevier Patient Education  2024 [employer].

## 2023-12-31 NOTE — Progress Notes (Unsigned)
 Subjective:    Patient ID: Pamela Rich, female    DOB: 1946-04-07, 78 y.o.   MRN: [id number]      HPI Pamela Rich is here for a Physical exam and her chronic medical problems.   Reviewed bone density-consider treatment  Dong well - no concerns.  Heat has been bothering her allergies/asthma  Medications and allergies reviewed with patient and updated if appropriate.  Current Outpatient Medications on File Prior to Visit  Medication Sig Dispense Refill   albuterol  (PROAIR  HFA) 108 (90 Base) MCG/ACT inhaler INHALE 2 PUFFS EVERY 4 HOURS AS NEEDED FOR WHEEZING OR COUGHING SPELLS 18 g 2   Ascorbic Acid (VITAMIN C) 1000 MG tablet Take 1,000 mg by mouth daily.     aspirin  81 MG tablet Take 81 mg by mouth daily.     Azelastine -Fluticasone  137-50 MCG/ACT SUSP Place 2 sprays into both nostrils 2 (two) times daily as needed. 23 g 11   Cholecalciferol (VITAMIN D3) 1000 UNITS CAPS Take by mouth daily.     fluticasone  (FLOVENT  HFA) 110 MCG/ACT inhaler At onset of respiratory illness/asthma flare: Inhale 2 puffs twice daily with spacer for 2 weeks or until symptoms resolve. 1 each 5   folic acid (FOLVITE) 1 MG tablet Take 1 mg by mouth daily.     ipratropium (ATROVENT) 0.03 % nasal spray Place 1 spray into both nostrils at bedtime as needed for rhinitis (use sparingly if becomes too dry). 30 mL 12   levothyroxine  (SYNTHROID ) 50 MCG tablet TAKE 2 TABLETS BY MOUTH EVERY DAY 180 tablet 1   Multiple Vitamin (MULTIVITAMIN) tablet Take by mouth.     omeprazole  (PRILOSEC) 20 MG capsule TAKE 1 CAPSULE BY MOUTH EVERY DAY 90 capsule 1   pravastatin  (PRAVACHOL ) 20 MG tablet TAKE 1 TABLET BY MOUTH EVERY DAY 90 tablet 1   No current facility-administered medications on file prior to visit.    Review of Systems  Constitutional:  Negative for fever.  Eyes:  Negative for visual disturbance.  Respiratory:  Positive for cough (with asthma, alergies). Negative for shortness of breath and wheezing.    Cardiovascular:  Negative for chest pain, palpitations and leg swelling.  Gastrointestinal:  Positive for constipation (? IBS). Negative for abdominal pain, blood in stool and diarrhea.       No gerd  Genitourinary:  Negative for dysuria.  Musculoskeletal:  Negative for arthralgias and back pain.  Skin:  Negative for rash.  Neurological:  Positive for headaches (weather related). Negative for light-headedness.  Psychiatric/Behavioral:  Negative for dysphoric mood. The patient is not nervous/anxious.        Objective:   Vitals:   01/01/24 1305  BP: 130/76  Pulse: 72  Temp: 98.2 F (36.8 C)  SpO2: 96%   Filed Weights   01/01/24 1305  Weight: 151 lb (68.5 kg)   Body mass index is 27.18 kg/m.  BP Readings from Last 3 Encounters:  01/01/24 130/76  12/18/23 122/78  10/02/23 122/78    Wt Readings from Last 3 Encounters:  01/01/24 151 lb (68.5 kg)  12/18/23 151 lb 1.6 oz (68.5 kg)  10/02/23 150 lb (68 kg)       Physical Exam Constitutional: She appears well-developed and well-nourished. No distress.  HENT:  Head: Normocephalic and atraumatic.  Right Ear: External ear normal. Normal ear canal and TM Left Ear: External ear normal.  Normal ear canal and TM Mouth/Throat: Oropharynx is clear and moist.  Eyes: Conjunctivae normal.  Neck: Neck supple. No  tracheal deviation present. No thyromegaly present.  No carotid bruit  Cardiovascular: Normal rate, regular rhythm and normal heart sounds.   No murmur heard.  No edema. Pulmonary/Chest: Effort normal and breath sounds normal. No respiratory distress. She has no wheezes. She has no rales.  Breast: deferred   Abdominal: Soft. She exhibits no distension. There is no tenderness.  Lymphadenopathy: She has no cervical adenopathy.  Skin: Skin is warm and dry. She is not diaphoretic.  Psychiatric: She has a normal mood and affect. Her behavior is normal.     Lab Results  Component Value Date   WBC 8.0 08/29/2023   HGB  13.5 08/29/2023   HCT 40.6 08/29/2023   PLT 401.0 (H) 08/29/2023   GLUCOSE 95 08/29/2023   CHOL 192 05/29/2023   TRIG 192.0 (H) 05/29/2023   HDL 66.40 05/29/2023   LDLDIRECT 109.0 12/29/2022   LDLCALC 87 05/29/2023   ALT 10 08/29/2023   AST 24 08/29/2023   NA 140 08/29/2023   K 4.1 08/29/2023   CL 102 08/29/2023   CREATININE 0.83 08/29/2023   BUN 10 08/29/2023   CO2 31 08/29/2023   TSH 0.78 08/29/2023   MICROALBUR 0.2 01/22/2007         Assessment & Plan:   Physical exam: Screening blood work  ordered Exercise  [facility]obics at [facility], walking when it is not so hot Weight  is good Substance abuse  none   Reviewed recommended immunizations.   Health Maintenance  Topic Date Due   DTaP/Tdap/Td (2 - Td or Tdap) 07/26/2022   COVID-19 Vaccine (7 - 2024-25 season) 03/05/2023   Medicare Annual Wellness (AWV)  12/26/2023   INFLUENZA VACCINE  02/02/2024   DEXA SCAN  07/19/2025   Pneumococcal Vaccine: 50+ Years  Completed   Hepatitis C Screening  Completed   Zoster Vaccines- Shingrix  Completed   Hepatitis B Vaccines  Aged Out   HPV VACCINES  Aged Out   Meningococcal B Vaccine  Aged Out   Colonoscopy  Discontinued          See Problem List for Assessment and Plan of chronic medical problems.

## 2024-01-01 ENCOUNTER — Ambulatory Visit (INDEPENDENT_AMBULATORY_CARE_PROVIDER_SITE_OTHER): Admitting: Internal Medicine

## 2024-01-01 ENCOUNTER — Ambulatory Visit: Payer: Self-pay | Admitting: Internal Medicine

## 2024-01-01 VITALS — BP 130/76 | HR 72 | Temp 98.2°F | Ht 62.5 in | Wt 151.0 lb

## 2024-01-01 DIAGNOSIS — K219 Gastro-esophageal reflux disease without esophagitis: Secondary | ICD-10-CM | POA: Diagnosis not present

## 2024-01-01 DIAGNOSIS — R739 Hyperglycemia, unspecified: Secondary | ICD-10-CM | POA: Diagnosis not present

## 2024-01-01 DIAGNOSIS — Z0000 Encounter for general adult medical examination without abnormal findings: Secondary | ICD-10-CM

## 2024-01-01 DIAGNOSIS — E782 Mixed hyperlipidemia: Secondary | ICD-10-CM | POA: Diagnosis not present

## 2024-01-01 DIAGNOSIS — E559 Vitamin D deficiency, unspecified: Secondary | ICD-10-CM

## 2024-01-01 DIAGNOSIS — M810 Age-related osteoporosis without current pathological fracture: Secondary | ICD-10-CM

## 2024-01-01 DIAGNOSIS — R7303 Prediabetes: Secondary | ICD-10-CM | POA: Insufficient documentation

## 2024-01-01 DIAGNOSIS — E039 Hypothyroidism, unspecified: Secondary | ICD-10-CM | POA: Diagnosis not present

## 2024-01-01 DIAGNOSIS — M85852 Other specified disorders of bone density and structure, left thigh: Secondary | ICD-10-CM

## 2024-01-01 LAB — VITAMIN D 25 HYDROXY (VIT D DEFICIENCY, FRACTURES): VITD: 53.85 ng/mL (ref 30.00–100.00)

## 2024-01-01 LAB — LIPID PANEL
Cholesterol: 197 mg/dL (ref 0–200)
HDL: 60.1 mg/dL (ref >39.00)
LDL Cholesterol: 94 mg/dL (ref 0–99)
NonHDL: 136.75
Total CHOL/HDL Ratio: 3
Triglycerides: 216 mg/dL — ABNORMAL HIGH (ref 0.0–149.0)
VLDL: 43.2 mg/dL — ABNORMAL HIGH (ref 0.0–40.0)

## 2024-01-01 LAB — CBC WITH DIFFERENTIAL/PLATELET
Basophils Absolute: 0 10*3/uL (ref 0.0–0.1)
Basophils Relative: 0.4 % (ref 0.0–3.0)
Eosinophils Absolute: 0.2 10*3/uL (ref 0.0–0.7)
Eosinophils Relative: 2.8 % (ref 0.0–5.0)
HCT: 40.5 % (ref 36.0–46.0)
Hemoglobin: 13.7 g/dL (ref 12.0–15.0)
Lymphocytes Relative: 30.8 % (ref 12.0–46.0)
Lymphs Abs: 2.5 10*3/uL (ref 0.7–4.0)
MCHC: 33.8 g/dL (ref 30.0–36.0)
MCV: 91.5 fl (ref 78.0–100.0)
Monocytes Absolute: 0.5 10*3/uL (ref 0.1–1.0)
Monocytes Relative: 6.7 % (ref 3.0–12.0)
Neutro Abs: 4.8 10*3/uL (ref 1.4–7.7)
Neutrophils Relative %: 59.3 % (ref 43.0–77.0)
Platelets: 381 10*3/uL (ref 150.0–400.0)
RBC: 4.43 Mil/uL (ref 3.87–5.11)
RDW: 13.6 % (ref 11.5–15.5)
WBC: 8.1 10*3/uL (ref 4.0–10.5)

## 2024-01-01 LAB — COMPREHENSIVE METABOLIC PANEL WITH GFR
ALT: 10 U/L (ref 0–35)
AST: 21 U/L (ref 0–37)
Albumin: 4.2 g/dL (ref 3.5–5.2)
Alkaline Phosphatase: 91 U/L (ref 39–117)
BUN: 15 mg/dL (ref 6–23)
CO2: 29 meq/L (ref 19–32)
Calcium: 9.4 mg/dL (ref 8.4–10.5)
Chloride: 100 meq/L (ref 96–112)
Creatinine, Ser: 0.83 mg/dL (ref 0.40–1.20)
GFR: 67.69 mL/min (ref >60.00)
Glucose, Bld: 102 mg/dL — ABNORMAL HIGH (ref 70–99)
Potassium: 4 meq/L (ref 3.5–5.1)
Sodium: 137 meq/L (ref 135–145)
Total Bilirubin: 0.3 mg/dL (ref 0.2–1.2)
Total Protein: 6.9 g/dL (ref 6.0–8.3)

## 2024-01-01 LAB — HEMOGLOBIN A1C: Hgb A1c MFr Bld: 6 % (ref 4.6–6.5)

## 2024-01-01 LAB — TSH: TSH: 0.91 u[IU]/mL (ref 0.35–5.50)

## 2024-01-01 MED ORDER — PRAVASTATIN SODIUM 40 MG PO TABS: 40.0000 mg | ORAL_TABLET | Freq: Every day | ORAL | 3 refills | Status: AC

## 2024-01-01 MED ORDER — ALENDRONATE SODIUM 70 MG PO TABS: 70.0000 mg | ORAL_TABLET | ORAL | 3 refills | Status: AC

## 2024-01-01 NOTE — Assessment & Plan Note (Signed)
 Chronic Taking vitamin D daily Check vitamin D level

## 2024-01-01 NOTE — Assessment & Plan Note (Addendum)
 Chronic Prior treatment Fosamax x 5 years DEXA up-to-date-done January 2025 Compared to her bone density 4 years ago there has been a decrease in bone density and elevated frax-discussed restarting medication Fosamax vs other Continue calcium  in MVI and vitamin D  - advised increasing calcium  intake Ck vitamin d  level Regular exercise  Restart fosamax 70 mg weekly - she does have some GERD and if this flares it up will consider different option

## 2024-01-01 NOTE — Assessment & Plan Note (Signed)
 Chronic Controlled Continue omeprazole 20 mg daily

## 2024-01-01 NOTE — Assessment & Plan Note (Signed)
Chronic  Clinically euthyroid Currently taking levothyroxine 100 mcg daily Check tsh  Titrate med dose if needed  

## 2024-01-01 NOTE — Assessment & Plan Note (Addendum)
 Chronic Lab Results  Component Value Date   LDLCALC 94 01/01/2024    Regular exercise and healthy diet encouraged Check lipid panel  She does have mild non-obs CAD - will increase statin Increase pravastatin  to 40 mg daily

## 2024-01-01 NOTE — Assessment & Plan Note (Signed)
 Episodes of elevated glucose Check A1c

## 2024-01-02 ENCOUNTER — Ambulatory Visit: Payer: Medicare PPO

## 2024-01-04 ENCOUNTER — Encounter: Payer: Medicare PPO | Admitting: Internal Medicine

## 2024-01-11 DIAGNOSIS — H524 Presbyopia: Secondary | ICD-10-CM | POA: Diagnosis not present

## 2024-01-11 DIAGNOSIS — H5212 Myopia, left eye: Secondary | ICD-10-CM | POA: Diagnosis not present

## 2024-01-11 DIAGNOSIS — Z961 Presence of intraocular lens: Secondary | ICD-10-CM | POA: Diagnosis not present

## 2024-01-11 DIAGNOSIS — H43813 Vitreous degeneration, bilateral: Secondary | ICD-10-CM | POA: Diagnosis not present

## 2024-01-11 DIAGNOSIS — H04123 Dry eye syndrome of bilateral lacrimal glands: Secondary | ICD-10-CM | POA: Diagnosis not present

## 2024-01-11 DIAGNOSIS — H52203 Unspecified astigmatism, bilateral: Secondary | ICD-10-CM | POA: Diagnosis not present

## 2024-01-30 ENCOUNTER — Ambulatory Visit (INDEPENDENT_AMBULATORY_CARE_PROVIDER_SITE_OTHER)

## 2024-01-30 VITALS — Ht 62.5 in | Wt 151.0 lb

## 2024-01-30 DIAGNOSIS — Z0000 Encounter for general adult medical examination without abnormal findings: Secondary | ICD-10-CM

## 2024-01-30 NOTE — Progress Notes (Signed)
 Subjective:   Pamela Rich is a 78 y.o. who presents for a Medicare Wellness preventive visit.  As a reminder, Annual Wellness Visits don't include a physical exam, and some assessments may be limited, especially if this visit is performed virtually. We may recommend an in-person follow-up visit with your provider if needed.  Visit Complete: Virtual I connected with  Pamela Rich on 01/30/24 by a audio enabled telemedicine application and verified that I am speaking with the correct person using two identifiers.  Patient Location: Home  Provider Location: Office/Clinic  I discussed the limitations of evaluation and management by telemedicine. The patient expressed understanding and agreed to proceed.  Vital Signs: Because this visit was a virtual/telehealth visit, some criteria may be missing or patient reported. Any vitals not documented were not Rich to be obtained and vitals that have been documented are patient reported.  VideoDeclined- This patient declined Librarian, academic. Therefore the visit was completed with audio only.  Persons Participating in Visit: Patient.  AWV Questionnaire: Yes: Patient Medicare AWV questionnaire was completed by the patient on 01/29/2024; I have confirmed that all information answered by patient is correct and no changes since this date.  Cardiac Risk Factors include: advanced age (>57men, >15 women);dyslipidemia     Objective:    Today's Vitals   01/30/24 1429  Weight: 151 lb (68.5 kg)  Height: 5' 2.5 (1.588 m)   Body mass index is 27.18 kg/m.     01/30/2024    2:31 PM 12/26/2022   10:21 AM 12/20/2021    3:03 PM 12/09/2020    1:58 PM 01/07/2020   10:22 AM 10/14/2019   12:23 PM 08/30/2018    1:26 PM  Advanced Directives  Does Patient Have a Medical Advance Directive? Yes Yes Yes Yes Yes Yes Yes   Type of Estate agent of [place];Living will Healthcare Power of [place];Living will  Healthcare Power of [place];Living will Healthcare Power of [place];Living will Healthcare Power of [place];Living will Healthcare Power of [place];Living will Healthcare Power of [place];Living will  Does patient want to make changes to medical advance directive?    No - Patient declined No - Patient declined No - Patient declined   Copy of Healthcare Power of Attorney in Chart? No - copy requested No - copy requested No - copy requested No - copy requested Yes - validated most recent copy scanned in chart (See row information)  No - copy requested      Data saved with a previous flowsheet row definition    Current Medications (verified) Outpatient Encounter Medications as of 01/30/2024  Medication Sig   albuterol  (PROAIR  HFA) 108 (90 Base) MCG/ACT inhaler INHALE 2 PUFFS EVERY 4 HOURS AS NEEDED FOR WHEEZING OR COUGHING SPELLS   alendronate  (FOSAMAX ) 70 MG tablet Take 1 tablet (70 mg total) by mouth every 7 (seven) days. Take with a full glass of water on an empty stomach.   Ascorbic Acid (VITAMIN C) 1000 MG tablet Take 1,000 mg by mouth daily.   aspirin  81 MG tablet Take 81 mg by mouth daily.   Azelastine -Fluticasone  137-50 MCG/ACT SUSP Place 2 sprays into both nostrils 2 (two) times daily as needed.   Cholecalciferol (VITAMIN D3) 1000 UNITS CAPS Take by mouth daily.   fluticasone  (FLOVENT  HFA) 110 MCG/ACT inhaler At onset of respiratory illness/asthma flare: Inhale 2 puffs twice daily with spacer for 2 weeks or until symptoms resolve.   folic acid (FOLVITE) 1 MG tablet Take 1  mg by mouth daily.   ipratropium (ATROVENT ) 0.03 % nasal spray Place 1 spray into both nostrils at bedtime as needed for rhinitis (use sparingly if becomes too dry).   levothyroxine  (SYNTHROID ) 50 MCG tablet TAKE 2 TABLETS BY MOUTH EVERY DAY   Multiple Vitamin (MULTIVITAMIN) tablet Take by mouth.   omeprazole  (PRILOSEC) 20 MG capsule TAKE 1 CAPSULE BY MOUTH EVERY DAY   pravastatin  (PRAVACHOL ) 40 MG tablet Take 1  tablet (40 mg total) by mouth daily.   No facility-administered encounter medications on file as of 01/30/2024.    Allergies (verified) Augmentin  [amoxicillin -pot clavulanate], Crestor  [rosuvastatin ], and Lexapro  [escitalopram ]   History: Past Medical History:  Diagnosis Date   Allergy    SEASONAL   Fibroid    no surgery   Heart murmur    Hyperlipidemia    Hypothyroidism    Migraines    Mild intermittent asthma 07/20/2015   pt is currently not taking any medications   Osteopenia    Sciatic nerve lesion 2018   Vitamin D  deficiency    Past Surgical History:  Procedure Laterality Date   CATARACT EXTRACTION Bilateral    may 2024   DENTAL SURGERY     DORSAL COMPARTMENT RELEASE Right 03/20/2018   Procedure: RIGHT OPEN  RELEASE DORSAL COMPARTMENT (DEQUERVAINS);  Surgeon: Murrell Kuba, MD;  Location: [facility];  Service: Orthopedics;  Laterality: Right;   HYSTEROSCOPY WITH D & C     LUMBAR FUSION     Dr Carles   SEPTOPLASTY     SINOSCOPY     TUBAL LIGATION     WRIST ARTHROSCOPY WITH FOVEAL TRIANGULAR FIBROCARTILAGE COMPLEX REPAIR Right 03/20/2018   Procedure: RIGHT WRIST ARTHROSCOPY WITH DEBRIDEMENT, SCAPHOLUNATE/TRIANGULAR FIBROCARTILAGE COMPLEX REPAIR;  Surgeon: Murrell Kuba, MD;  Location: [facility];  Service: Orthopedics;  Laterality: Right;   Family History  Problem Relation Age of Onset   Hypertension Mother    Heart disease Mother        CHF; AS   Osteoporosis Mother    Heart attack Mother 9   Allergic rhinitis Mother    Diabetes Father    Stroke Father        onset in 47s   Cancer Brother        malignant brain tumor   Colon cancer Neg Hx    Esophageal cancer Neg Hx    Rectal cancer Neg Hx    Stomach cancer Neg Hx    Angioedema Neg Hx    Asthma Neg Hx    Eczema Neg Hx    Immunodeficiency Neg Hx    Urticaria Neg Hx    Breast cancer Neg Hx    Social History   Socioeconomic History   Marital status: Widowed    Spouse  name: Not on file   Number of children: 3   Years of education: Not on file   Highest education level: Not on file  Occupational History   Occupation: retired  Tobacco Use   Smoking status: Former    Current packs/day: 0.00    Types: Cigarettes    Quit date: 07/04/1974    Years since quitting: 49.6    Passive exposure: Past   Smokeless tobacco: Never   Tobacco comments:    smoked 1965-1976 , < 3 cig/ day  Vaping Use   Vaping status: Never Used  Substance and Sexual Activity   Alcohol use: No    Alcohol/week: 0.0 standard drinks of alcohol    Comment: rarely  Drug use: No   Sexual activity: Not Currently    Partners: Male    Birth control/protection: Surgical, Post-menopausal    Comment: BTL  Other Topics Concern   Not on file  Social History Narrative   Exercise: walking, stretching, weights   Social Drivers of Health   Financial Resource Strain: Low Risk  (01/30/2024)   Overall Financial Resource Strain (CARDIA)    Difficulty of Paying Living Expenses: Not hard at all  Food Insecurity: No Food Insecurity (01/30/2024)   Hunger Vital Sign    Worried About Running Out of Food in the Last Year: Never true    Ran Out of Food in the Last Year: Never true  Transportation Needs: No Transportation Needs (01/30/2024)   PRAPARE - Administrator, Civil Service (Medical): No    Lack of Transportation (Non-Medical): No  Physical Activity: Sufficiently Active (01/30/2024)   Exercise Vital Sign    Days of Exercise per Week: 3 days    Minutes of Exercise per Session: 60 min  Stress: No Stress Concern Present (01/30/2024)   [employer] of Occupational Health - Occupational Stress Questionnaire    Feeling of Stress: Not at all  Social Connections: Moderately Integrated (01/30/2024)   Social Connection and Isolation Panel    Frequency of Communication with Friends and Family: More than three times a week    Frequency of Social Gatherings with Friends and Family: Twice a  week    Attends Religious Services: More than 4 times per year    Active Member of [employer] or Organizations: Yes    Attends Banker Meetings: More than 4 times per year    Marital Status: Widowed    Tobacco Counseling Counseling given: Not Answered Tobacco comments: smoked 1965-1976 , < 3 cig/ day    Clinical Intake:  Pre-visit preparation completed: Yes  Pain : No/denies pain     BMI - recorded: 27.18 Nutritional Status: BMI 25 -29 Overweight Nutritional Risks: None Diabetes: No  Lab Results  Component Value Date   HGBA1C 6.0 01/01/2024     How often do you need to have someone help you when you read instructions, pamphlets, or other written materials from your doctor or pharmacy?: 1 - Never  Interpreter Needed?: No  Information entered by :: Verdie Saba, CMA   Activities of Daily Living     01/30/2024    2:32 PM 01/29/2024    2:30 PM  In your present state of health, do you have any difficulty performing the following activities:  Hearing? 1 1  Vision? 0 0  Difficulty concentrating or making decisions? 0 0  Walking or climbing stairs? 0 0  Dressing or bathing? 0 0  Doing errands, shopping? 0 0  Preparing Food and eating ? N N  Using the Toilet? N N  In the past six months, have you accidently leaked urine? N N  Do you have problems with loss of bowel control? N N  Managing your Medications? N N  Managing your Finances? N N  Housekeeping or managing your Housekeeping? N N    Patient Care Team: Geofm Glade PARAS, MD as PCP - General (Internal Medicine) Bobbitt, Elgin Pepper, MD as Consulting Physician (Allergy and Immunology) Cleotilde Ronal RAMAN, MD as Consulting Physician (Gynecology) Joshua Sieving, MD as Consulting Physician (Dermatology) Patrcia Sharper, MD as Consulting Physician (Ophthalmology)  I have updated your Care Teams any recent Medical Services you may have received from other providers in the past year.  Assessment:   This is  a routine wellness examination for Pamela Rich.  Hearing/Vision screen Hearing Screening - Comments:: Denies hearing difficulties - wears hearing aids Vision Screening - Comments:: Wears rx glasses - up to date with routine eye exams with Dr Patrcia   Goals Addressed               This Visit's Progress     Patient Stated (pt-stated)        Patient stated she plans to continue exercising and watching her diet - she joined a water aerobics class        Depression Screen     01/30/2024    2:34 PM 01/01/2024    1:23 PM 10/02/2023    4:05 PM 12/29/2022   10:41 AM 12/26/2022   10:21 AM 10/07/2022   10:31 AM 08/08/2022    2:07 PM  PHQ 2/9 Scores  PHQ - 2 Score 0 0 0 0 0 0 0  PHQ- 9 Score 0 0   0      Fall Risk     01/30/2024    2:33 PM 01/29/2024    2:30 PM 01/01/2024    1:22 PM 10/02/2023    4:05 PM 12/29/2022   10:41 AM  Fall Risk   Falls in the past year? 0 0 0 0 0  Number falls in past yr: 0  0 0 0  Injury with Fall? 0  0 0 0  Risk for fall due to : No Fall Risks  No Fall Risks No Fall Risks No Fall Risks  Follow up Falls evaluation completed;Falls prevention discussed  Falls evaluation completed Falls evaluation completed Falls evaluation completed    MEDICARE RISK AT HOME:  Medicare Risk at Home Any stairs in or around the home?: Yes If so, are there any without handrails?: No Home free of loose throw rugs in walkways, pet beds, electrical cords, etc?: Yes Adequate lighting in your home to reduce risk of falls?: Yes Life alert?: No Use of a cane, walker or w/c?: No Grab bars in the bathroom?: Yes Shower chair or bench in shower?: No Elevated toilet seat or a handicapped toilet?: Yes  TIMED UP AND GO:  Was the test performed?  No  Cognitive Function:         01/30/2024    2:35 PM 12/26/2022   10:30 AM 10/14/2019   12:29 PM  6CIT Screen  What Year? 0 points 0 points 0 points  What month? 0 points 0 points 0 points  What time? 0 points 0 points 0 points  Count back  from 20 0 points 0 points 0 points  Months in reverse 0 points 0 points 0 points  Repeat phrase 0 points 0 points 0 points  Total Score 0 points 0 points 0 points    Immunizations Immunization History  Administered Date(s) Administered   Fluad Quad(high Dose 65+) 03/14/2019, 03/17/2022   Influenza Split 04/06/2011, 04/18/2012   Influenza Whole 04/18/2007, 04/23/2008, 04/08/2009, 03/26/2010   Influenza, High Dose Seasonal PF 04/16/2013, 03/24/2016, 04/04/2017, 04/13/2018, 03/16/2020, 03/22/2021, 03/15/2023   Influenza,inj,Quad PF,6+ Mos 03/18/2014, 03/24/2015   PFIZER Comirnaty(Gray Top)Covid-19 Tri-Sucrose Vaccine 10/19/2020   PFIZER(Purple Top)SARS-COV-2 Vaccination 08/10/2019, 08/31/2019, 03/23/2020   Pfizer Covid-19 Vaccine Bivalent Booster 64yrs & up 03/25/2021   Pneumococcal Conjugate-13 11/04/2014   Pneumococcal Polysaccharide-23 01/22/2007, 11/12/2012   Respiratory Syncytial Virus Vaccine,Recomb Aduvanted(Arexvy) 08/04/2022   Tdap 07/26/2012, 01/16/2024   Unspecified SARS-COV-2 Vaccination 04/14/2022   Zoster Recombinant(Shingrix) 05/05/2022, 09/06/2022   Zoster,  Live 09/24/2007    Screening Tests Health Maintenance  Topic Date Due   COVID-19 Vaccine (7 - 2024-25 season) 03/05/2023   INFLUENZA VACCINE  02/02/2024   Medicare Annual Wellness (AWV)  01/29/2025   DEXA SCAN  07/19/2025   DTaP/Tdap/Td (3 - Td or Tdap) 01/15/2034   Pneumococcal Vaccine: 50+ Years  Completed   Hepatitis C Screening  Completed   Zoster Vaccines- Shingrix  Completed   Hepatitis B Vaccines  Aged Out   HPV VACCINES  Aged Out   Meningococcal B Vaccine  Aged Out   Colonoscopy  Discontinued    Health Maintenance  Health Maintenance Due  Topic Date Due   COVID-19 Vaccine (7 - 2024-25 season) 03/05/2023   Health Maintenance Items Addressed: 01/30/2024  Additional Screening:  Vision Screening: Recommended annual ophthalmology exams for early detection of glaucoma and other disorders of the  eye. Would you like a referral to an eye doctor? No   Patient has had an eye exam w/Dr Tanner in 01/2024.  Dental Screening: Recommended annual dental exams for proper oral hygiene  Community Resource Referral / Chronic Care Management: CRR required this visit?  No   CCM required this visit?  No   Plan:    I have personally reviewed and noted the following in the patient's chart:   Medical and social history Use of alcohol, tobacco or illicit drugs  Current medications and supplements including opioid prescriptions. Patient is not currently taking opioid prescriptions. Functional ability and status Nutritional status Physical activity Advanced directives List of other physicians Hospitalizations, surgeries, and ER visits in previous 12 months Vitals Screenings to include cognitive, depression, and falls Referrals and appointments  In addition, I have reviewed and discussed with patient certain preventive protocols, quality metrics, and best practice recommendations. A written personalized care plan for preventive services as well as general preventive health recommendations were provided to patient.   Verdie CHRISTELLA Saba, CMA   01/30/2024   After Visit Summary: (MyChart) Due to this being a telephonic visit, the after visit summary with patients personalized plan was offered to patient via MyChart   Notes: Nothing significant to report at this time.

## 2024-01-30 NOTE — Patient Instructions (Signed)
 Pamela Rich , Thank you for taking time out of your busy schedule to complete your Annual Wellness Visit with me. I enjoyed our conversation and look forward to speaking with you again next year. I, as well as your care team,  appreciate your ongoing commitment to your health goals. Please review the following plan we discussed and let me know if I can assist you in the future. Your Game plan/ To Do List    Follow up Visits: Next Medicare AWV with our clinical staff: 02/13/2025 - at the office   Have you seen your provider in the last 6 months (3 months if uncontrolled diabetes)? Yes Next Office Visit with your provider: 07/2024  Clinician Recommendations:  Aim for 30 minutes of exercise or brisk walking, 6-8 glasses of water, and 5 servings of fruits and vegetables each day.       This is a list of the screening recommended for you and due dates:  Health Maintenance  Topic Date Due   COVID-19 Vaccine (7 - 2024-25 season) 03/05/2023   Flu Shot  02/02/2024   Medicare Annual Wellness Visit  01/29/2025   DEXA scan (bone density measurement)  07/19/2025   DTaP/Tdap/Td vaccine (3 - Td or Tdap) 01/15/2034   Pneumococcal Vaccine for age over 72  Completed   Hepatitis C Screening  Completed   Zoster (Shingles) Vaccine  Completed   Hepatitis B Vaccine  Aged Out   HPV Vaccine  Aged Out   Meningitis B Vaccine  Aged Out   Colon Cancer Screening  Discontinued    Advanced directives: (Copy Requested) Please bring a copy of your health care power of attorney and living will to the office to be added to your chart at your convenience. You can mail to [facility] [address] [place], [place] [address] or email to [email or web address] Advance Care Planning is important because it:  [x]  Makes sure you receive the medical care that is consistent with your values, goals, and preferences  [x]  It provides guidance to your family and loved ones and reduces their decisional burden  about whether or not they are making the right decisions based on your wishes.  Follow the link provided in your after visit summary or read over the paperwork we have mailed to you to help you started getting your Advance Directives in place. If you need assistance in completing these, please reach out to us  so that we can help you!

## 2024-02-28 ENCOUNTER — Telehealth: Payer: Self-pay

## 2024-02-28 NOTE — Telephone Encounter (Signed)
 Copied from CRM #[id number]. Topic: Clinical - Medical Advice >> Feb 28, 2024  1:27 PM Pinkey ORN wrote: Reason for CRM: Requesting A Call Back >> Feb 28, 2024  1:28 PM Pinkey ORN wrote: Patient states she has concerns with constant vomiting. Patient wants to know if she needs to be referred over to a gastro specialist. Contact patient at [phone]

## 2024-02-29 NOTE — Telephone Encounter (Signed)
Spoke with patient and appointment made

## 2024-02-29 NOTE — Telephone Encounter (Signed)
 I can see her tomorrow in my 320 slot.  She really needs to be seen.  I can refer to GI but they will not be able to see her probably for 2 more than 3 weeks.

## 2024-03-01 ENCOUNTER — Encounter: Payer: Self-pay | Admitting: Internal Medicine

## 2024-03-01 ENCOUNTER — Ambulatory Visit: Admitting: Internal Medicine

## 2024-03-01 VITALS — BP 128/80 | HR 92 | Temp 98.0°F | Ht 62.5 in | Wt 148.0 lb

## 2024-03-01 DIAGNOSIS — G43009 Migraine without aura, not intractable, without status migrainosus: Secondary | ICD-10-CM | POA: Diagnosis not present

## 2024-03-01 MED ORDER — RIZATRIPTAN BENZOATE 10 MG PO TBDP
10.0000 mg | ORAL_TABLET | ORAL | 5 refills | Status: AC | PRN
Start: 2024-03-01 — End: ?

## 2024-03-01 NOTE — Assessment & Plan Note (Signed)
 History of migraine headaches that have improved since she has gotten older Used to take Fiorinal  Has had 2 migraines this year - the second was 3 days ago - still having significant fatigue as a result of the migraine Start Maxalt -MLT 10 mg as needed for migraine, can repeat times once after 2 hours Deferred nausea medication

## 2024-03-01 NOTE — Patient Instructions (Addendum)
 Medications changes include :   Maxalt  for migraines    Migraine Headache A migraine headache is a very strong throbbing pain on one or both sides of your head. This type of headache can also cause other symptoms. It can last from 4 hours to 3 days. Talk with your doctor about what things may bring on (trigger) this condition. What are the causes? The exact cause of a migraine is not known. This condition may be brought on or caused by: Smoking. Medicines, such as: Medicine used to treat chest pain (nitroglycerin). Birth control pills. Estrogen. Some blood pressure medicines. Certain substances in some foods or drinks. Foods and drinks, such as: Cheese. Chocolate. Alcohol. Caffeine . Doing physical activity that is very hard. Other things that may trigger a migraine headache include: Periods. Pregnancy. Hunger. Stress. Getting too much or too little sleep. Weather changes. Feeling tired (fatigue). What increases the risk? Being 49-38 years old. Being female. Having a family history of migraine headaches. Being Caucasian. Having a mental health condition, such as being sad (depressed) or feeling worried or nervous (anxious). Being very overweight (obese). What are the signs or symptoms? A throbbing pain. This pain may: Happen in any area of the head, such as on one or both sides. Make it hard to do daily activities. Get worse with physical activity. Get worse around bright lights, loud noises, or smells. Other symptoms may include: Feeling like you may vomit (nauseous). Vomiting. Dizziness. Before a migraine headache starts, you may get warning signs (an aura). An aura may include: Seeing flashing lights or having blind spots. Seeing bright spots, halos, or zigzag lines. Having tunnel vision or blurred vision. Having numbness or a tingling feeling. Having trouble talking. Having weak muscles. After a migraine ends, you may have symptoms. These may  include: Tiredness. Trouble thinking (concentrating). How is this treated? Taking medicines that: Relieve pain. Relieve the feeling like you may vomit. Prevent migraine headaches. Treatment may also include: Acupuncture. Lifestyle changes like avoiding foods that bring on migraine headaches. Learning ways to control your body functions (biofeedback). Therapy to help you know and deal with negative thoughts (cognitive behavioral therapy). Follow these instructions at home: Medicines Take over-the-counter and prescription medicines only as told by your doctor. If told, take steps to prevent problems with pooping (constipation). You may need to: Drink enough fluid to keep your pee (urine) pale yellow. Take medicines. You will be told what medicines to take. Eat foods that are high in fiber. These include beans, whole grains, and fresh fruits and vegetables. Limit foods that are high in fat and sugar. These include fried or sweet foods. Ask your doctor if you should avoid driving or using machines while you are taking your medicine. Lifestyle  Do not drink alcohol. Do not smoke or use any products that contain nicotine or tobacco. If you need help quitting, ask your doctor. Get 7-9 hours of sleep each night, or the amount recommended by your doctor. Find ways to deal with stress, such as meditation, deep breathing, or yoga. Try to exercise often. This can help lessen how bad and how often your migraines happen. General instructions Keep a journal to find out what may bring on your migraine headaches. This can help you avoid those things. For example, write down: What you eat and drink. How much sleep you get. Any change to your medicines or diet. If you have a migraine headache: Avoid things that make your symptoms worse, such as bright  lights. Lie down in a dark, quiet room. Do not drive or use machinery. Ask your doctor what activities are safe for you. Where to find more  information Coalition for Headache and Migraine Patients (CHAMP): headachemigraine.org American Migraine Foundation: americanmigrainefoundation.org National Headache Foundation: headaches.org Contact a doctor if: You get a migraine headache that is different or worse than others you have had. You have more than 15 days of headaches in one month. Get help right away if: Your migraine headache gets very bad. Your migraine headache lasts more than 72 hours. You have a fever or stiff neck. You have trouble seeing. Your muscles feel weak or like you cannot control them. You lose your balance a lot. You have trouble walking. You faint. You have a seizure. This information is not intended to replace advice given to you by your health care provider. Make sure you discuss any questions you have with your health care provider. Document Revised: 02/14/2022 Document Reviewed: 02/14/2022 Elsevier Patient Education  2024 [employer].

## 2024-03-01 NOTE — Progress Notes (Signed)
 Subjective:    Patient ID: Pamela Rich, female    DOB: May 28, 1946, 78 y.o.   MRN: [id number]      HPI Pamela Rich is here for  Chief Complaint  Patient presents with   Migraine    Migraine w/nausea that puts her down for about 3 days; Asking for medication    She has a history of migraines and used to take Fiorinal , but migraines improved and now she rarely has a migraine.  She did have a migraine earlier this year and 3 days ago had another 1.  She thinks the migraine 3 days ago started secondary to increased stress-dealing with technology.  The headache started quickly 3 nights ago and shortly after that she started vomiting which is typical.  The following day she had a severe headache and took Tylenol , Advil and nothing seemed to help.  The following day, yesterday, the headache resolved but she felt weak and fatigued and she still feels that way now.   Her migraines have been intense and it takes a long time to recover, especially with all the vomiting and she wanted to have a medication in case she gets another 1.  She does have a little oral-it is hard for her to describe but she often can tell when the migraine is coming.  She denies any numbness, tingling or weakness in her arms or legs.  Medications and allergies reviewed with patient and updated if appropriate.  Current Outpatient Medications on File Prior to Visit  Medication Sig Dispense Refill   albuterol  (PROAIR  HFA) 108 (90 Base) MCG/ACT inhaler INHALE 2 PUFFS EVERY 4 HOURS AS NEEDED FOR WHEEZING OR COUGHING SPELLS 18 g 2   alendronate  (FOSAMAX ) 70 MG tablet Take 1 tablet (70 mg total) by mouth every 7 (seven) days. Take with a full glass of water on an empty stomach. 12 tablet 3   Ascorbic Acid (VITAMIN C) 1000 MG tablet Take 1,000 mg by mouth daily.     aspirin  81 MG tablet Take 81 mg by mouth daily.     Azelastine -Fluticasone  137-50 MCG/ACT SUSP Place 2 sprays into both nostrils 2 (two) times daily as  needed. 23 g 11   Cholecalciferol (VITAMIN D3) 1000 UNITS CAPS Take by mouth daily.     fluticasone  (FLOVENT  HFA) 110 MCG/ACT inhaler At onset of respiratory illness/asthma flare: Inhale 2 puffs twice daily with spacer for 2 weeks or until symptoms resolve. 1 each 5   folic acid (FOLVITE) 1 MG tablet Take 1 mg by mouth daily.     ipratropium (ATROVENT ) 0.03 % nasal spray Place 1 spray into both nostrils at bedtime as needed for rhinitis (use sparingly if becomes too dry). 30 mL 12   levothyroxine  (SYNTHROID ) 50 MCG tablet TAKE 2 TABLETS BY MOUTH EVERY DAY 180 tablet 1   Multiple Vitamin (MULTIVITAMIN) tablet Take by mouth.     omeprazole  (PRILOSEC) 20 MG capsule TAKE 1 CAPSULE BY MOUTH EVERY DAY 90 capsule 1   pravastatin  (PRAVACHOL ) 40 MG tablet Take 1 tablet (40 mg total) by mouth daily. 90 tablet 3   No current facility-administered medications on file prior to visit.    Review of Systems     Objective:   Vitals:   03/01/24 1524  BP: 128/80  Pulse: 92  Temp: 98 F (36.7 C)  SpO2: 97%   BP Readings from Last 3 Encounters:  03/01/24 128/80  01/01/24 130/76  12/18/23 122/78   Wt Readings from Last 3 Encounters:  03/01/24 148 lb (67.1 kg)  01/30/24 151 lb (68.5 kg)  01/01/24 151 lb (68.5 kg)   Body mass index is 26.64 kg/m.    Physical Exam Constitutional:      General: She is not in acute distress.    Appearance: Normal appearance. She is not ill-appearing.  HENT:     Head: Normocephalic and atraumatic.  Skin:    General: Skin is warm and dry.  Neurological:     Mental Status: She is alert and oriented to person, place, and time. Mental status is at baseline.     Cranial Nerves: No cranial nerve deficit.     Sensory: No sensory deficit.     Motor: No weakness.  Psychiatric:        Mood and Affect: Mood normal.        Behavior: Behavior normal.        Thought Content: Thought content normal.            Assessment & Plan:    See Problem List for  Assessment and Plan of chronic medical problems.

## 2024-03-11 DIAGNOSIS — D1801 Hemangioma of skin and subcutaneous tissue: Secondary | ICD-10-CM | POA: Diagnosis not present

## 2024-03-11 DIAGNOSIS — L821 Other seborrheic keratosis: Secondary | ICD-10-CM | POA: Diagnosis not present

## 2024-03-11 DIAGNOSIS — L812 Freckles: Secondary | ICD-10-CM | POA: Diagnosis not present

## 2024-03-14 ENCOUNTER — Encounter: Payer: Self-pay | Admitting: Family

## 2024-03-14 ENCOUNTER — Encounter: Payer: Self-pay | Admitting: Urology

## 2024-03-19 ENCOUNTER — Telehealth: Payer: Self-pay | Admitting: Internal Medicine

## 2024-03-19 NOTE — Telephone Encounter (Signed)
 Von called and stated that she is interested in starting Singulair . She is having to use her inhaler more frequently, and after talking with a friend about her experience with Singulair , she thinks it may help her. She uses the [facility] on [address].

## 2024-03-20 NOTE — Telephone Encounter (Signed)
 Recommend OV to discuss symptoms and potential new medication.

## 2024-03-28 ENCOUNTER — Ambulatory Visit
Admission: RE | Admit: 2024-03-28 | Discharge: 2024-03-28 | Disposition: A | Discharge status: Home / Self Care | Source: Ambulatory Visit | Attending: Urology | Admitting: Urology

## 2024-03-28 DIAGNOSIS — N281 Cyst of kidney, acquired: Secondary | ICD-10-CM | POA: Diagnosis not present

## 2024-03-28 MED ORDER — GADOPICLENOL 0.5 MMOL/ML IV SOLN
7.0000 mL | Freq: Once | INTRAVENOUS | Status: AC | PRN
  Administered 2024-03-28: 7 mL via INTRAVENOUS

## 2024-04-09 DIAGNOSIS — N182 Chronic kidney disease, stage 2 (mild): Secondary | ICD-10-CM | POA: Diagnosis not present

## 2024-04-09 DIAGNOSIS — E039 Hypothyroidism, unspecified: Secondary | ICD-10-CM | POA: Diagnosis not present

## 2024-04-09 DIAGNOSIS — R030 Elevated blood-pressure reading, without diagnosis of hypertension: Secondary | ICD-10-CM | POA: Diagnosis not present

## 2024-04-09 DIAGNOSIS — G43909 Migraine, unspecified, not intractable, without status migrainosus: Secondary | ICD-10-CM | POA: Diagnosis not present

## 2024-04-09 DIAGNOSIS — M810 Age-related osteoporosis without current pathological fracture: Secondary | ICD-10-CM | POA: Diagnosis not present

## 2024-04-09 DIAGNOSIS — E785 Hyperlipidemia, unspecified: Secondary | ICD-10-CM | POA: Diagnosis not present

## 2024-04-09 DIAGNOSIS — K219 Gastro-esophageal reflux disease without esophagitis: Secondary | ICD-10-CM | POA: Diagnosis not present

## 2024-04-09 DIAGNOSIS — Z8249 Family history of ischemic heart disease and other diseases of the circulatory system: Secondary | ICD-10-CM | POA: Diagnosis not present

## 2024-04-09 DIAGNOSIS — I739 Peripheral vascular disease, unspecified: Secondary | ICD-10-CM | POA: Diagnosis not present

## 2024-04-25 DIAGNOSIS — N281 Cyst of kidney, acquired: Secondary | ICD-10-CM | POA: Diagnosis not present

## 2024-04-25 DIAGNOSIS — R399 Unspecified symptoms and signs involving the genitourinary system: Secondary | ICD-10-CM | POA: Diagnosis not present

## 2024-05-06 ENCOUNTER — Encounter: Payer: Self-pay | Admitting: Radiology

## 2024-05-08 ENCOUNTER — Ambulatory Visit: Admitting: Family

## 2024-05-08 ENCOUNTER — Encounter: Payer: Self-pay | Admitting: Family

## 2024-05-08 ENCOUNTER — Other Ambulatory Visit: Payer: Self-pay

## 2024-05-08 DIAGNOSIS — M79642 Pain in left hand: Secondary | ICD-10-CM | POA: Diagnosis not present

## 2024-05-08 DIAGNOSIS — S63642A Sprain of metacarpophalangeal joint of left thumb, initial encounter: Secondary | ICD-10-CM

## 2024-05-08 NOTE — Progress Notes (Signed)
 Office Visit Note   Patient: Pamela Rich           Date of Birth: [birth year]           MRN: [id number] Visit Date: 05/08/2024              Requested by: Geofm Glade JINNY, MD [address] [place],  [place] [address] PCP: Geofm Glade JINNY, MD  Chief Complaint  Patient presents with   Left Hand - Pain      HPI: The patient is a 78 year old woman who presents today with a 3-week history of left thumb pain after jamming her thumb while washing the car about 3 weeks ago she complains of stiffness and pain with range of motion and use of the thumb some associated weakness in her grip  Assessment & Plan: Visit Diagnoses:  1. Pain in left hand     Plan: Placed in a thumb splint she will wear this for the next 2 to 3 weeks as needed discussed using topical anti-inflammatories she has been using CBD oil topically and may continue to do so  If she fails to improve as expected will follow-up in the office with Dr. Erwin  Follow-Up Instructions: No follow-ups on file.   Left Hand Exam   Tenderness  The patient is experiencing tenderness in the palmar area.   Range of Motion  The patient has normal left wrist ROM.  Tests  Finkelstein's test: negative  Other  Erythema: absent Sensation: normal Pulse: present  Comments:  No pain with loading the MCP joint, extension of thumb painful      Patient is alert, oriented, no adenopathy, well-dressed, normal affect, normal respiratory effort.     Imaging: No results found. No images are attached to the encounter.  Labs: Lab Results  Component Value Date   HGBA1C 6.0 01/01/2024   LABORGA ESCHERICHIA COLI 02/07/2017     Lab Results  Component Value Date   ALBUMIN 4.2 01/01/2024   ALBUMIN 4.2 08/29/2023   ALBUMIN 4.3 05/29/2023    No results found for: MG Lab Results  Component Value Date   VD25OH 53.85 01/01/2024   VD25OH 61.99 12/29/2022   VD25OH 60.76 08/04/2020    No results found for:  PREALBUMIN    Latest Ref Rng & Units 01/01/2024    1:54 PM 08/29/2023    4:05 PM 12/29/2022   11:43 AM  CBC EXTENDED  WBC 4.0 - 10.5 K/uL 8.1  8.0  7.6   RBC 3.87 - 5.11 Mil/uL 4.43  4.29  4.43   Hemoglobin 12.0 - 15.0 g/dL 86.2  86.4  86.1   HCT 36.0 - 46.0 % 40.5  40.6  41.1   Platelets 150.0 - 400.0 K/uL 381.0  401.0  363.0   NEUT# 1.4 - 7.7 K/uL 4.8  4.3  4.8   Lymph# 0.7 - 4.0 K/uL 2.5  2.4  2.0      There is no height or weight on file to calculate BMI.  Orders:  Orders Placed This Encounter  Procedures   XR Hand Complete Left   No orders of the defined types were placed in this encounter.    Procedures: No procedures performed  Clinical Data: No additional findings.  ROS:  All other systems negative, except as noted in the HPI. Review of Systems  Objective: Vital Signs: LMP 07/04/2000   Specialty Comments:  No specialty comments available.  PMFS History: Patient Active Problem List   Diagnosis Date Noted  Prediabetes 01/01/2024   Acute cystitis 10/02/2023   Agatston coronary artery calcium  score 198  (01/2023) 02/02/2023   Screening for heart disease 12/29/2022   Bilateral shoulder bursitis 12/17/2021   Complex renal cyst, left 08/23/2021   COVID-19 05/11/2021   Anxiety 08/04/2020   Medial epicondylitis of elbow, left 11/22/2019   PAD (peripheral artery disease) 04/01/2019   Everitt Quervain's syndrome (tenosynovitis) 03/12/2018   Hearing loss, wears hearing aids 06/22/2017   Lumbar radiculopathy 02/28/2017   Chronic pain of both knees 06/21/2016   Mild intermittent asthma 07/20/2015   Other allergic rhinitis 07/20/2015   Mixed hyperlipidemia 03/01/2012   Migraine without aura 04/05/2010   Vitamin D  deficiency 03/19/2009   GERD (gastroesophageal reflux disease) 07/08/2008   DEGENERATIVE JOINT DISEASE 12/05/2007   Hypothyroidism 01/22/2007   IBS 09/28/2006   Osteoporosis 09/28/2006   Past Medical History:  Diagnosis Date   Allergy    SEASONAL    Fibroid    no surgery   Heart murmur    Hyperlipidemia    Hypothyroidism    Migraines    Mild intermittent asthma 07/20/2015   pt is currently not taking any medications   Osteopenia    Sciatic nerve lesion 2018   Vitamin D  deficiency     Family History  Problem Relation Age of Onset   Hypertension Mother    Heart disease Mother        CHF; AS   Osteoporosis Mother    Heart attack Mother 67   Allergic rhinitis Mother    Diabetes Father    Stroke Father        onset in 37s   Cancer Brother        malignant brain tumor   Colon cancer Neg Hx    Esophageal cancer Neg Hx    Rectal cancer Neg Hx    Stomach cancer Neg Hx    Angioedema Neg Hx    Asthma Neg Hx    Eczema Neg Hx    Immunodeficiency Neg Hx    Urticaria Neg Hx    Breast cancer Neg Hx     Past Surgical History:  Procedure Laterality Date   CATARACT EXTRACTION Bilateral    may 2024   DENTAL SURGERY     DORSAL COMPARTMENT RELEASE Right 03/20/2018   Procedure: RIGHT OPEN  RELEASE DORSAL COMPARTMENT (DEQUERVAINS);  Surgeon: Murrell Kuba, MD;  Location: [facility];  Service: Orthopedics;  Laterality: Right;   HYSTEROSCOPY WITH D & C     LUMBAR FUSION     Dr Carles   SEPTOPLASTY     SINOSCOPY     TUBAL LIGATION     WRIST ARTHROSCOPY WITH FOVEAL TRIANGULAR FIBROCARTILAGE COMPLEX REPAIR Right 03/20/2018   Procedure: RIGHT WRIST ARTHROSCOPY WITH DEBRIDEMENT, SCAPHOLUNATE/TRIANGULAR FIBROCARTILAGE COMPLEX REPAIR;  Surgeon: Murrell Kuba, MD;  Location: [facility];  Service: Orthopedics;  Laterality: Right;   Social History   Occupational History   Occupation: retired  Tobacco Use   Smoking status: Former    Current packs/day: 0.00    Types: Cigarettes    Quit date: 07/04/1974    Years since quitting: 49.8    Passive exposure: Past   Smokeless tobacco: Never   Tobacco comments:    smoked 1965-1976 , < 3 cig/ day  Vaping Use   Vaping status: Never Used  Substance and Sexual  Activity   Alcohol use: No    Alcohol/week: 0.0 standard drinks of alcohol    Comment: rarely  Drug use: No   Sexual activity: Not Currently    Partners: Male    Birth control/protection: Surgical, Post-menopausal    Comment: BTL

## 2024-05-15 DIAGNOSIS — J029 Acute pharyngitis, unspecified: Secondary | ICD-10-CM | POA: Diagnosis not present

## 2024-06-04 DIAGNOSIS — R52 Pain, unspecified: Secondary | ICD-10-CM | POA: Diagnosis not present

## 2024-06-04 DIAGNOSIS — R051 Acute cough: Secondary | ICD-10-CM | POA: Diagnosis not present

## 2024-06-04 DIAGNOSIS — J101 Influenza due to other identified influenza virus with other respiratory manifestations: Secondary | ICD-10-CM | POA: Diagnosis not present

## 2024-06-04 DIAGNOSIS — R5383 Other fatigue: Secondary | ICD-10-CM | POA: Diagnosis not present

## 2024-06-12 ENCOUNTER — Ambulatory Visit: Payer: Self-pay

## 2024-06-12 NOTE — Telephone Encounter (Signed)
 FYI Only or Action Required?: FYI only for provider: appointment scheduled on 06/13/24.  Patient was last seen in primary care on 03/01/2024 by Geofm Glade PARAS, MD.  Called Nurse Triage reporting Sinusitis.  Symptoms began several days ago.  Interventions attempted: Nothing.  Symptoms are: unchanged.  Triage Disposition: See Physician Within 24 Hours  Patient/caregiver understands and will follow disposition?: Yes   Copied from CRM #[id number]. Topic: Clinical - Red Word Triage >> Jun 12, 2024  1:39 PM Berneda FALCON wrote: Red Word that prompted transfer to Nurse Triage: Patient was seen at urgent care on 12/1 and she was diagnosed with the flu. She states she was given  tamiflu but could not take it because it hurt her stomach. She believes most of the symptoms are gone now, but suspects a sinus infection due to a dull pain in her temples of her head. Reason for Disposition  [1] Sinus pain (not just congestion) AND [2] fever  Answer Assessment - Initial Assessment Questions Scheduled alt prov 06/13/24.  Advised call back or ED/911 if symptoms worsen. Pt verbalized understanding.  1. LOCATION: Where does it hurt?      Temporal pressure, ear pressure, ongoing cough 2. ONSET: When did the sinus pain start?  (e.g., hours, days)      Several days 3. SEVERITY: How bad is the pain?   (Scale 0-10; or none, mild, moderate or severe)     Mild; pressure 4. RECURRENT SYMPTOM: Have you ever had sinus problems before? If Yes, ask: When was the last time? and What happened that time?     yes 5. NASAL CONGESTION: Is the nose blocked? If Yes, ask: Can you open it or must you breathe through your mouth?     no 6. NASAL DISCHARGE: Do you have discharge from your nose? If so ask, What color?     no 7. FEVER: Do you have a fever? If Yes, ask: What is it, how was it measured, and when did it start?      Denies fever, chills, n/v, dizziness, diff breathing, chest pain 8. OTHER  SYMPTOMS: Do you have any other symptoms? (e.g., sore throat, cough, earache, difficulty breathing)    Cough; clear phlegm, denies chest pain, diff breathing, faint, sore throat.  Protocols used: Sinus Pain or Congestion-A-AH

## 2024-06-13 ENCOUNTER — Encounter: Payer: Self-pay | Admitting: Family Medicine

## 2024-06-13 ENCOUNTER — Ambulatory Visit: Admitting: Family Medicine

## 2024-06-13 VITALS — BP 180/100 | HR 75 | Temp 97.5°F | Ht 62.5 in | Wt 150.4 lb

## 2024-06-13 DIAGNOSIS — R0981 Nasal congestion: Secondary | ICD-10-CM

## 2024-06-13 DIAGNOSIS — R030 Elevated blood-pressure reading, without diagnosis of hypertension: Secondary | ICD-10-CM | POA: Diagnosis not present

## 2024-06-13 DIAGNOSIS — R051 Acute cough: Secondary | ICD-10-CM | POA: Diagnosis not present

## 2024-06-13 DIAGNOSIS — J111 Influenza due to unidentified influenza virus with other respiratory manifestations: Secondary | ICD-10-CM | POA: Diagnosis not present

## 2024-06-13 NOTE — Progress Notes (Signed)
 Established Patient Office Visit   Subjective  Patient ID: Pamela Rich, female    DOB: 11-19-1945  Age: 78 y.o. MRN: [id number]  Chief Complaint  Patient presents with   Acute Visit    Patient went to a clinic 12/1 and was diagnosed with the flu, was prescribed tamiflu but it gave her diarrhea so she stopped taking it. Patient had a fever for a week, cough, head congestion, dizziness and headache.    Patient is a 78 year old female followed by seen for ongoing concern.  Patient seen at [facility] on 12/1, Dx with flu.  Unable to tolerate Tamiflu due to diarrhea. Feeling somewhat better, but still coughing and having nasal congestion. Fever has resolved.  Appetite improving.  Ate Jeanenne Novak last night.  Feeling thirsty this am.  Had flu vaccine this yr.    Patient Active Problem List   Diagnosis Date Noted   Prediabetes 01/01/2024   Acute cystitis 10/02/2023   Agatston coronary artery calcium  score 198  (01/2023) 02/02/2023   Screening for heart disease 12/29/2022   Bilateral shoulder bursitis 12/17/2021   Complex renal cyst, left 08/23/2021   COVID-19 05/11/2021   Anxiety 08/04/2020   Medial epicondylitis of elbow, left 11/22/2019   PAD (peripheral artery disease) 04/01/2019   De Quervain's syndrome (tenosynovitis) 03/12/2018   Hearing loss, wears hearing aids 06/22/2017   Lumbar radiculopathy 02/28/2017   Chronic pain of both knees 06/21/2016   Mild intermittent asthma 07/20/2015   Other allergic rhinitis 07/20/2015   Mixed hyperlipidemia 03/01/2012   Migraine without aura 04/05/2010   Vitamin D  deficiency 03/19/2009   GERD (gastroesophageal reflux disease) 07/08/2008   DEGENERATIVE JOINT DISEASE 12/05/2007   Hypothyroidism 01/22/2007   IBS 09/28/2006   Osteoporosis 09/28/2006   Past Medical History:  Diagnosis Date   Allergy    SEASONAL   Arthritis 1990   Fibroid    no surgery   Heart murmur    Hyperlipidemia    Hypothyroidism    Migraines    Mild intermittent  asthma 07/20/2015   pt is currently not taking any medications   Osteopenia    Sciatic nerve lesion 2018   Vitamin D  deficiency    Past Surgical History:  Procedure Laterality Date   CATARACT EXTRACTION Bilateral    may 2024   DENTAL SURGERY     DORSAL COMPARTMENT RELEASE Right 03/20/2018   Procedure: RIGHT OPEN  RELEASE DORSAL COMPARTMENT (DEQUERVAINS);  Surgeon: Murrell Kuba, MD;  Location: [facility];  Service: Orthopedics;  Laterality: Right;   EYE SURGERY  2024   Cataract surgery   HYSTEROSCOPY WITH D & C     LUMBAR FUSION     Dr Carles   SEPTOPLASTY     SINOSCOPY     SPINE SURGERY  1996   Degenerative disk   TUBAL LIGATION     WRIST ARTHROSCOPY WITH FOVEAL TRIANGULAR FIBROCARTILAGE COMPLEX REPAIR Right 03/20/2018   Procedure: RIGHT WRIST ARTHROSCOPY WITH DEBRIDEMENT, SCAPHOLUNATE/TRIANGULAR FIBROCARTILAGE COMPLEX REPAIR;  Surgeon: Murrell Kuba, MD;  Location: [facility];  Service: Orthopedics;  Laterality: Right;   Social History[1] Family History  Problem Relation Age of Onset   Hypertension Mother    Heart disease Mother        CHF; AS   Osteoporosis Mother    Heart attack Mother 12   Allergic rhinitis Mother    Diabetes Father    Stroke Father        onset in 22s  Arthritis Father    Hearing loss Father    Cancer Brother        malignant brain tumor   Colon cancer Neg Hx    Esophageal cancer Neg Hx    Rectal cancer Neg Hx    Stomach cancer Neg Hx    Angioedema Neg Hx    Asthma Neg Hx    Eczema Neg Hx    Immunodeficiency Neg Hx    Urticaria Neg Hx    Breast cancer Neg Hx    Allergies[2]  ROS Negative unless stated above    Objective:     BP (!) 180/100 (BP Location: Left Arm, Patient Position: Sitting, Cuff Size: Normal)   Pulse 75   Temp (!) 97.5 F (36.4 C) (Oral)   Ht 5' 2.5 (1.588 m)   Wt 150 lb 6.4 oz (68.2 kg)   LMP 07/04/2000   SpO2 99%   BMI 27.07 kg/m  BP Readings from Last 3 Encounters:  06/13/24  (!) 180/100  03/01/24 128/80  01/01/24 130/76   Wt Readings from Last 3 Encounters:  06/13/24 150 lb 6.4 oz (68.2 kg)  03/01/24 148 lb (67.1 kg)  01/30/24 151 lb (68.5 kg)      Physical Exam Constitutional:      General: She is not in acute distress.    Appearance: Normal appearance.  HENT:     Head: Normocephalic and atraumatic.     Nose: Nose normal.     Mouth/Throat:     Mouth: Mucous membranes are moist.  Cardiovascular:     Rate and Rhythm: Normal rate and regular rhythm.     Heart sounds: Normal heart sounds. No murmur heard.    No gallop.  Pulmonary:     Effort: Pulmonary effort is normal. No respiratory distress.     Breath sounds: Normal breath sounds. No wheezing, rhonchi or rales.     Comments: cough Skin:    General: Skin is warm and dry.  Neurological:     Mental Status: She is alert and oriented to person, place, and time.        01/30/2024    2:34 PM 01/01/2024    1:23 PM 10/02/2023    4:05 PM  Depression screen PHQ 2/9  Decreased Interest 0 0 0  Down, Depressed, Hopeless 0 0 0  PHQ - 2 Score 0 0 0  Altered sleeping 0 0   Tired, decreased energy 0 0   Change in appetite 0 0   Feeling bad or failure about yourself  0 0   Trouble concentrating 0 0   Moving slowly or fidgety/restless 0 0   Suicidal thoughts 0 0   PHQ-9 Score 0  0    Difficult doing work/chores Not difficult at all Not difficult at all      Data saved with a previous flowsheet row definition       No data to display           No results found for any visits on 06/13/24.    Assessment & Plan:   Acute cough  Influenza  Nasal congestion  Elevated blood pressure reading without diagnosis of hypertension   URI sx 2/2 Influenza infection.  Sx improving.  Post viral cough and congestion.  Continue supportive care.  Lungs clear on exam.  Advised to be mindful of sodium intake and decongestant use as can raise bp. Increase intake of water and fluids.  Continue to monitor.   F/u with pcp.  Return  if symptoms worsen or fail to improve.   Clotilda JONELLE Single, MD     [1]  Social History Tobacco Use   Smoking status: Former    Current packs/day: 0.00    Types: Cigarettes    Quit date: 07/04/1974    Years since quitting: 49.9    Passive exposure: Past   Smokeless tobacco: Never   Tobacco comments:    smoked 1965-1976 , < 3 cig/ day  Vaping Use   Vaping status: Never Used  Substance Use Topics   Alcohol use: No    Alcohol/week: 0.0 standard drinks of alcohol    Comment: rarely   Drug use: No  [2]  Allergies Allergen Reactions   Augmentin  [Amoxicillin -Pot Clavulanate] Diarrhea   Crestor  [Rosuvastatin ] Other (See Comments)    Muscle pain on 10 mg   Lexapro  [Escitalopram ] Other (See Comments)    Headache, made her feel sad

## 2024-06-19 ENCOUNTER — Ambulatory Visit: Admitting: Internal Medicine

## 2024-06-19 ENCOUNTER — Encounter: Payer: Self-pay | Admitting: Internal Medicine

## 2024-06-19 VITALS — BP 140/72 | HR 65 | Temp 98.2°F | Ht 62.5 in | Wt 142.0 lb

## 2024-06-19 DIAGNOSIS — J019 Acute sinusitis, unspecified: Secondary | ICD-10-CM | POA: Diagnosis not present

## 2024-06-19 DIAGNOSIS — J4520 Mild intermittent asthma, uncomplicated: Secondary | ICD-10-CM | POA: Diagnosis not present

## 2024-06-19 DIAGNOSIS — J101 Influenza due to other identified influenza virus with other respiratory manifestations: Secondary | ICD-10-CM | POA: Diagnosis not present

## 2024-06-19 MED ORDER — DOXYCYCLINE HYCLATE 100 MG PO TABS: 200.0000 mg | ORAL_TABLET | Freq: Every day | ORAL | 0 refills | Status: AC

## 2024-06-19 NOTE — Patient Instructions (Addendum)
° ° ° ° °  Medications changes include :   doxycycline  200 mg daily x 5 days      Return if symptoms worsen or fail to improve.

## 2024-06-19 NOTE — Progress Notes (Signed)
 Subjective:    Patient ID: Pamela Rich, female    DOB: 1946-03-03, 78 y.o.   MRN: [id number]      HPI [facility] is here for  Chief Complaint  Patient presents with   Cough    Cough post flu; sick since December 1st      Discussed the use of AI scribe software for clinical note transcription with the patient, who gave verbal consent to proceed.  History of Present Illness Pamela Rich is a 78 year old female with intermittent asthma who presents with persistent flu symptoms and suspected sinus infection.  Her symptoms began on December 1st after visiting the dentist. She experienced sudden onset of flu-like symptoms later that day, despite having received a flu shot earlier that morning. She tested positive for influenza A at a walk-in clinic and was prescribed Tamiflu, which she took for two to three days before discontinuing due to diarrhea.  During the first week of illness, she experienced daily fevers and was bedridden, with a lack of appetite and difficulty eating. She managed to consume some soups and tried to stay hydrated. By the second week, her fever subsided, but she continued to experience a persistent cough, congestion, and fatigue. In the third week, she reports improvement but still feels easily fatigued and has been limiting her activities.  She describes a sensation of pressure in her head, particularly behind her eyes and temples, which she associates with past experiences of sinus infections following illnesses. This pressure is intermittent and accompanied by fluid sensations in her ears. She has experienced daily headaches since the onset of the flu and occasional lightheadedness and dizziness, though these symptoms have improved.  She has intermittent asthma and possesses both a rescue inhaler and a corticosteroid inhaler, which she uses as needed. She acknowledges forgetting to use them regularly but notes that they are helpful when used.  She has  been taking Tylenol  Sinus occasionally for symptom relief, which she believes may have helped break her fever two nights ago. She has not experienced a sore throat or ear pain recently, though she still has some congestion and a mild cough.  She lives alone and has been isolating herself due to the contagious nature of her illness, which has impacted her social interactions and routine activities.     Medications and allergies reviewed with patient and updated if appropriate.  Medications Ordered Prior to Encounter[1]  Review of Systems  Constitutional:  Positive for fatigue and fever (subjective fever two nights ago).  HENT:  Positive for congestion and sinus pressure. Negative for ear pain and sore throat.   Respiratory:  Positive for cough (improved). Negative for shortness of breath and wheezing.   Neurological:  Positive for headaches. Negative for dizziness and light-headedness.       Objective:   Vitals:   06/19/24 1605  BP: (!) 140/72  Pulse: 65  Temp: 98.2 F (36.8 C)  SpO2: 97%   BP Readings from Last 3 Encounters:  06/19/24 (!) 140/72  06/13/24 (!) 180/100  03/01/24 128/80   Wt Readings from Last 3 Encounters:  06/19/24 142 lb (64.4 kg)  06/13/24 150 lb 6.4 oz (68.2 kg)  03/01/24 148 lb (67.1 kg)   Body mass index is 25.56 kg/m.    Physical Exam Constitutional:      General: She is not in acute distress.    Appearance: Normal appearance. She is not ill-appearing.  HENT:     Head: Normocephalic and atraumatic.  Right Ear: Tympanic membrane, ear canal and external ear normal.     Left Ear: Tympanic membrane, ear canal and external ear normal.     Mouth/Throat:     Mouth: Mucous membranes are moist.     Pharynx: No oropharyngeal exudate or posterior oropharyngeal erythema.  Eyes:     Conjunctiva/sclera: Conjunctivae normal.  Cardiovascular:     Rate and Rhythm: Normal rate and regular rhythm.  Pulmonary:     Effort: Pulmonary effort is normal. No  respiratory distress.     Breath sounds: Normal breath sounds. No wheezing or rales.  Musculoskeletal:     Cervical back: Neck supple. No tenderness.  Lymphadenopathy:     Cervical: No cervical adenopathy.  Skin:    General: Skin is warm and dry.  Neurological:     Mental Status: She is alert.            Assessment & Plan:    See Problem List for Assessment and Plan of chronic medical problems.    Assessment and Plan Assessment & Plan Acute sinusitis Likely secondary to recent influenza A infection. Symptoms suggest sinus infection rather than ongoing viral infection. - Prescribed doxycycline  100 mg, two pills once daily for five days. - Advised to take doxycycline  with a full glass of water and not to lie down immediately after taking it. - Instructed to take doxycycline  with food if nausea occurs. - Encouraged increased fluid intake and rest.  Influenza A Initial treatment with Tamiflu discontinued due to severe diarrhea. Symptoms improved, but residual cough and congestion persist. Discussed limited efficacy of flu vaccine and potential for secondary infections like sinusitis. - Continue supportive care with hydration and rest.  Asthma Intermittent asthma with recent exacerbation due to influenza A. Discussed importance of using inhalers during respiratory infections to prevent complications. - Encouraged use of inhalers, especially during respiratory infections.        [1]  Current Outpatient Medications on File Prior to Visit  Medication Sig Dispense Refill   albuterol  (PROAIR  HFA) 108 (90 Base) MCG/ACT inhaler INHALE 2 PUFFS EVERY 4 HOURS AS NEEDED FOR WHEEZING OR COUGHING SPELLS 18 g 2   alendronate  (FOSAMAX ) 70 MG tablet Take 1 tablet (70 mg total) by mouth every 7 (seven) days. Take with a full glass of water on an empty stomach. 12 tablet 3   Ascorbic Acid (VITAMIN C) 1000 MG tablet Take 1,000 mg by mouth daily.     aspirin  81 MG tablet Take 81 mg by mouth  daily.     Azelastine -Fluticasone  137-50 MCG/ACT SUSP Place 2 sprays into both nostrils 2 (two) times daily as needed. 23 g 11   Cholecalciferol (VITAMIN D3) 1000 UNITS CAPS Take by mouth daily.     fluticasone  (FLOVENT  HFA) 110 MCG/ACT inhaler At onset of respiratory illness/asthma flare: Inhale 2 puffs twice daily with spacer for 2 weeks or until symptoms resolve. 1 each 5   folic acid (FOLVITE) 1 MG tablet Take 1 mg by mouth daily.     ipratropium (ATROVENT ) 0.03 % nasal spray Place 1 spray into both nostrils at bedtime as needed for rhinitis (use sparingly if becomes too dry). 30 mL 12   levothyroxine  (SYNTHROID ) 50 MCG tablet TAKE 2 TABLETS BY MOUTH EVERY DAY 180 tablet 1   Multiple Vitamin (MULTIVITAMIN) tablet Take by mouth.     omeprazole  (PRILOSEC) 20 MG capsule TAKE 1 CAPSULE BY MOUTH EVERY DAY 90 capsule 1   pravastatin  (PRAVACHOL ) 40 MG tablet Take 1 tablet (  40 mg total) by mouth daily. 90 tablet 3   rizatriptan  (MAXALT -MLT) 10 MG disintegrating tablet Take 1 tablet (10 mg total) by mouth as needed for migraine. May repeat in 2 hours if needed 10 tablet 5   No current facility-administered medications on file prior to visit.

## 2024-07-10 NOTE — Progress Notes (Signed)
 "     Subjective:    Patient ID: Pamela Rich, female    DOB: 10-28-1945, 79 y.o.   MRN: [id number]     HPI Pamela Rich is here for follow up of her chronic medical problems.  Discussed the use of AI scribe software for clinical note transcription with the patient, who gave verbal consent to proceed.  History of Present Illness Pamela Rich is a 79 year old female who presents with persistent sinus infection symptoms.  She was treated with an antibiotic for a sinus infection several weeks ago, but the infection was not completely cleared. Lingering symptoms include sinus pressure, occasional headaches, and coughing up discolored mucus. Previous treatments with a Z-Pak have been effective, and she recalls a previous instance where a second Z-Pak was required to fully resolve her symptoms. She experienced diarrhea at the end of the last Z-Pak course, but it was manageable. No fever since December when she had the flu. Significant nasal congestion is present, and she is using saline and a nasal spray prescribed by her allergist, which includes azelastine  and Flonase . She mentions a history of epistaxis and experienced a minor one recently, which she attributes to nasal spray use. She is cautious with nasal sprays due to her history of epistaxis.  She is currently taking medication for heartburn, which is effective when taken once daily before breakfast. She also uses an inhaler as needed, which helps prevent shortness of breath and wheezing. She experienced thrush in the past after using her inhaler and now tries to be diligent about rinsing her mouth after use.  She has been walking recently due to favorable weather and notes some soreness from the activity. She mentions participating in a water aerobics class. She takes medication for osteoporosis once a week and is managing her medication schedule around her levothyroxine  and heartburn medication.  She experienced a migraine about a week  ago, which she attributes to [place] and possibly residual weakness from a recent flu. She took a prescribed migraine medication, which was effective but made her feel groggy. She is cautious about taking multiple medications simultaneously but was relieved that the migraine medication worked.    Medications and allergies reviewed with patient and updated if appropriate.  Medications Ordered Prior to Encounter[1]   Review of Systems  Constitutional:  Negative for fever.  HENT:  Positive for congestion, postnasal drip and sinus pressure.   Respiratory:  Positive for cough (productive at times). Negative for shortness of breath and wheezing.   Cardiovascular:  Negative for chest pain, palpitations and leg swelling.  Gastrointestinal:        GERD controlled  Neurological:  Positive for headaches. Negative for dizziness and light-headedness.       Objective:   Vitals:   07/11/24 1126  BP: 138/76  Pulse: 78  Temp: 98.1 F (36.7 C)  SpO2: 96%   BP Readings from Last 3 Encounters:  07/11/24 138/76  06/19/24 (!) 140/72  06/13/24 (!) 180/100   Wt Readings from Last 3 Encounters:  07/11/24 147 lb (66.7 kg)  06/19/24 142 lb (64.4 kg)  06/13/24 150 lb 6.4 oz (68.2 kg)   Body mass index is 26.46 kg/m.    Physical Exam Constitutional:      General: She is not in acute distress.    Appearance: Normal appearance.  HENT:     Head: Normocephalic and atraumatic.  Eyes:     Conjunctiva/sclera: Conjunctivae normal.  Cardiovascular:  Rate and Rhythm: Normal rate and regular rhythm.     Heart sounds: Normal heart sounds.  Pulmonary:     Effort: Pulmonary effort is normal. No respiratory distress.     Breath sounds: Normal breath sounds. No wheezing.  Musculoskeletal:     Cervical back: Neck supple.     Right lower leg: No edema.     Left lower leg: No edema.  Lymphadenopathy:     Cervical: No cervical adenopathy.  Skin:    General: Skin is  warm and dry.     Findings: No rash.  Neurological:     Mental Status: She is alert. Mental status is at baseline.  Psychiatric:        Mood and Affect: Mood normal.        Behavior: Behavior normal.        Lab Results  Component Value Date   WBC 8.1 01/01/2024   HGB 13.7 01/01/2024   HCT 40.5 01/01/2024   PLT 381.0 01/01/2024   GLUCOSE 102 (H) 01/01/2024   CHOL 197 01/01/2024   TRIG 216.0 (H) 01/01/2024   HDL 60.10 01/01/2024   LDLDIRECT 109.0 12/29/2022   LDLCALC 94 01/01/2024   ALT 10 01/01/2024   AST 21 01/01/2024   NA 137 01/01/2024   K 4.0 01/01/2024   CL 100 01/01/2024   CREATININE 0.83 01/01/2024   BUN 15 01/01/2024   CO2 29 01/01/2024   TSH 0.91 01/01/2024   HGBA1C 6.0 01/01/2024   MICROALBUR 0.2 01/22/2007     Assessment & Plan:    See Problem List for Assessment and Plan of chronic medical problems.        [1]  Current Outpatient Medications on File Prior to Visit  Medication Sig Dispense Refill   albuterol  (PROAIR  HFA) 108 (90 Base) MCG/ACT inhaler INHALE 2 PUFFS EVERY 4 HOURS AS NEEDED FOR WHEEZING OR COUGHING SPELLS 18 g 2   alendronate  (FOSAMAX ) 70 MG tablet Take 1 tablet (70 mg total) by mouth every 7 (seven) days. Take with a full glass of water on an empty stomach. 12 tablet 3   Ascorbic Acid (VITAMIN C) 1000 MG tablet Take 1,000 mg by mouth daily.     aspirin  81 MG tablet Take 81 mg by mouth daily.     Azelastine -Fluticasone  137-50 MCG/ACT SUSP Place 2 sprays into both nostrils 2 (two) times daily as needed. 23 g 11   benzonatate  (TESSALON ) 100 MG capsule Take by mouth 3 (three) times daily as needed for cough.     Cholecalciferol (VITAMIN D3) 1000 UNITS CAPS Take by mouth daily.     fluticasone  (FLOVENT  HFA) 110 MCG/ACT inhaler At onset of respiratory illness/asthma flare: Inhale 2 puffs twice daily with spacer for 2 weeks or until symptoms resolve. 1 each 5   folic acid (FOLVITE) 1 MG tablet Take 1 mg by mouth daily.     ipratropium  (ATROVENT ) 0.03 % nasal spray Place 1 spray into both nostrils at bedtime as needed for rhinitis (use sparingly if becomes too dry). 30 mL 12   levothyroxine  (SYNTHROID ) 50 MCG tablet TAKE 2 TABLETS BY MOUTH EVERY DAY 180 tablet 1   Multiple Vitamin (MULTIVITAMIN) tablet Take by mouth.     omeprazole  (PRILOSEC) 20 MG capsule TAKE 1 CAPSULE BY MOUTH EVERY DAY 90 capsule 1   pravastatin  (PRAVACHOL ) 40 MG tablet Take 1 tablet (40 mg total) by mouth daily. 90 tablet 3   rizatriptan  (MAXALT -MLT) 10 MG disintegrating tablet Take 1 tablet (10  mg total) by mouth as needed for migraine. May repeat in 2 hours if needed 10 tablet 5   No current facility-administered medications on file prior to visit.   "

## 2024-07-10 NOTE — Patient Instructions (Addendum)
" ° ° ° ° °  Blood work was ordered.       Medications changes include :   None      Return in about 6 months (around 01/08/2025) for Physical Exam.  "

## 2024-07-11 ENCOUNTER — Ambulatory Visit: Admitting: Internal Medicine

## 2024-07-11 ENCOUNTER — Encounter: Payer: Self-pay | Admitting: Internal Medicine

## 2024-07-11 ENCOUNTER — Ambulatory Visit: Payer: Self-pay | Admitting: Internal Medicine

## 2024-07-11 VITALS — BP 138/76 | HR 78 | Temp 98.1°F | Ht 62.5 in | Wt 147.0 lb

## 2024-07-11 DIAGNOSIS — R7303 Prediabetes: Secondary | ICD-10-CM | POA: Diagnosis not present

## 2024-07-11 DIAGNOSIS — K219 Gastro-esophageal reflux disease without esophagitis: Secondary | ICD-10-CM

## 2024-07-11 DIAGNOSIS — E039 Hypothyroidism, unspecified: Secondary | ICD-10-CM | POA: Diagnosis not present

## 2024-07-11 DIAGNOSIS — E559 Vitamin D deficiency, unspecified: Secondary | ICD-10-CM

## 2024-07-11 DIAGNOSIS — J329 Chronic sinusitis, unspecified: Secondary | ICD-10-CM | POA: Insufficient documentation

## 2024-07-11 DIAGNOSIS — E7800 Pure hypercholesterolemia, unspecified: Secondary | ICD-10-CM | POA: Diagnosis not present

## 2024-07-11 DIAGNOSIS — G43009 Migraine without aura, not intractable, without status migrainosus: Secondary | ICD-10-CM

## 2024-07-11 DIAGNOSIS — M810 Age-related osteoporosis without current pathological fracture: Secondary | ICD-10-CM

## 2024-07-11 DIAGNOSIS — J4520 Mild intermittent asthma, uncomplicated: Secondary | ICD-10-CM | POA: Diagnosis not present

## 2024-07-11 LAB — COMPREHENSIVE METABOLIC PANEL WITH GFR
ALT: 11 U/L (ref 3–35)
AST: 25 U/L (ref 5–37)
Albumin: 4.4 g/dL (ref 3.5–5.2)
Alkaline Phosphatase: 71 U/L (ref 39–117)
BUN: 15 mg/dL (ref 6–23)
CO2: 29 meq/L (ref 19–32)
Calcium: 9.7 mg/dL (ref 8.4–10.5)
Chloride: 102 meq/L (ref 96–112)
Creatinine, Ser: 0.87 mg/dL (ref 0.40–1.20)
GFR: 63.73 mL/min
Glucose, Bld: 84 mg/dL (ref 70–99)
Potassium: 4.3 meq/L (ref 3.5–5.1)
Sodium: 138 meq/L (ref 135–145)
Total Bilirubin: 0.4 mg/dL (ref 0.2–1.2)
Total Protein: 7.4 g/dL (ref 6.0–8.3)

## 2024-07-11 LAB — TSH: TSH: 1.17 u[IU]/mL (ref 0.35–5.50)

## 2024-07-11 LAB — LIPID PANEL
Cholesterol: 186 mg/dL (ref 28–200)
HDL: 69.7 mg/dL
LDL Cholesterol: 87 mg/dL (ref 10–99)
NonHDL: 116.17
Total CHOL/HDL Ratio: 3
Triglycerides: 147 mg/dL (ref 10.0–149.0)
VLDL: 29.4 mg/dL (ref 0.0–40.0)

## 2024-07-11 LAB — CBC
HCT: 39.1 % (ref 36.0–46.0)
Hemoglobin: 13 g/dL (ref 12.0–15.0)
MCHC: 33.3 g/dL (ref 30.0–36.0)
MCV: 92.9 fl (ref 78.0–100.0)
Platelets: 377 K/uL (ref 150.0–400.0)
RBC: 4.21 Mil/uL (ref 3.87–5.11)
RDW: 13.8 % (ref 11.5–15.5)
WBC: 9 K/uL (ref 4.0–10.5)

## 2024-07-11 LAB — HEMOGLOBIN A1C: Hgb A1c MFr Bld: 5.7 % (ref 4.6–6.5)

## 2024-07-11 LAB — VITAMIN D 25 HYDROXY (VIT D DEFICIENCY, FRACTURES): VITD: 56.61 ng/mL (ref 30.00–100.00)

## 2024-07-11 MED ORDER — OMEPRAZOLE 20 MG PO CPDR: 20.0000 mg | DELAYED_RELEASE_CAPSULE | Freq: Every day | ORAL | 3 refills | Status: AC

## 2024-07-11 MED ORDER — BENZONATATE 100 MG PO CAPS: 100.0000 mg | ORAL_CAPSULE | Freq: Three times a day (TID) | ORAL | 1 refills | Status: AC | PRN

## 2024-07-11 MED ORDER — LEVOTHYROXINE SODIUM 100 MCG PO TABS: 100.0000 ug | ORAL_TABLET | Freq: Every day | ORAL | Status: AC

## 2024-07-11 MED ORDER — AZITHROMYCIN 250 MG PO TABS: ORAL_TABLET | ORAL | 0 refills | Status: AC

## 2024-07-11 NOTE — Assessment & Plan Note (Signed)
 Chronic Taking vitamin D daily Check vitamin D level

## 2024-07-11 NOTE — Assessment & Plan Note (Signed)
 Chronic Controlled Continue omeprazole 20 mg daily

## 2024-07-11 NOTE — Assessment & Plan Note (Signed)
Chronic  Clinically euthyroid Currently taking levothyroxine 100 mcg daily Check tsh  Titrate med dose if needed  

## 2024-07-11 NOTE — Assessment & Plan Note (Signed)
 Chronic Lab Results  Component Value Date   LDLCALC 94 01/01/2024    Regular exercise and healthy diet encouraged Check lipid panel, CMP, TSH She does have mild non-obs CAD - will increase statin Increase pravastatin  to 40 mg daily

## 2024-07-11 NOTE — Assessment & Plan Note (Signed)
 Chronic Controlled management per allergist Flovent  2 puffs twice daily as needed, albuterol  inhaler as needed

## 2024-07-11 NOTE — Assessment & Plan Note (Addendum)
 History of migraine headaches that have improved since she has gotten older Has maybe migraines a year Used Maxalt  for the first time recently-it did make her groggy Continue Maxalt -MLT 10 mg as needed for migraine, can repeat times once after 2 hours If grogginess is too much can consider trying Nurtec or [place]

## 2024-07-11 NOTE — Assessment & Plan Note (Signed)
 Chronic Prior treatment Fosamax  x 5 years DEXA up-to-date-done January 2025 Compared to her bone density 4 years ago there has been a decrease in bone density and elevated frax-discussed restarting medication Fosamax  vs other Continue calcium  in MVI and vitamin D  - advised increasing calcium  intake Ck vitamin d  level Regular exercise  Restarted Fosamax  70 mg daily 12/2023 Next DEXA 07/2025

## 2024-07-11 NOTE — Assessment & Plan Note (Signed)
 Chronic Lab Results  Component Value Date   HGBA1C 6.0 01/01/2024   Check a1c Low sugar / carb diet Stressed regular exercise

## 2024-07-11 NOTE — Assessment & Plan Note (Signed)
 Chronic-partially treated Still having symptoms consistent with a sinus infection Previous antibiotic did help, but still having enough symptoms that would warrant an additional antibiotic Start Z-Pak She will let me know if her symptoms do not resolve

## 2024-07-15 ENCOUNTER — Telehealth: Payer: Self-pay | Admitting: Internal Medicine

## 2024-07-15 NOTE — Telephone Encounter (Signed)
 Patient left a message with billing. She said she received a bill from [employer] for a spacer and mask. She said she never received any of this equipment and doesn't know why she was billed. Can someone call her about this. [facility] billing does not bill for this. Thank you.

## 2024-07-15 NOTE — Telephone Encounter (Signed)
 Spoke with [employer]--she stated that she would get the information over to the billing dept and let them work on disputing the charges since the patient did receive a spacer.   Updated the patient on details and let her know someone would be in contact with her with updates. Verbalized understanding.

## 2024-07-15 NOTE — Telephone Encounter (Signed)
 Spoke with the patient and patient informed me that she never received a spacer even though paperwork was signed. Stated that she already had one from Dr. Green.   Called and LVM with [employer] regarding potential dispute of this charge. Requested call back.

## 2024-07-15 NOTE — Telephone Encounter (Signed)
 Received a call from [employer] dept--indicated that charges will be disputed and noted in her chart.  Updated patient--verbalized understanding. Informed her to follow up with us  if she is still receiving bills from [employer].

## 2024-08-05 ENCOUNTER — Encounter: Payer: Self-pay | Admitting: Internal Medicine

## 2024-08-05 DIAGNOSIS — G43009 Migraine without aura, not intractable, without status migrainosus: Secondary | ICD-10-CM

## 2024-08-05 MED ORDER — BUTALBITAL-ASPIRIN-CAFFEINE 50-325-40 MG PO CAPS: 1.0000 | ORAL_CAPSULE | Freq: Four times a day (QID) | ORAL | 0 refills | Status: AC | PRN

## 2024-08-05 NOTE — Telephone Encounter (Signed)

## 2024-10-07 ENCOUNTER — Ambulatory Visit (HOSPITAL_BASED_OUTPATIENT_CLINIC_OR_DEPARTMENT_OTHER): Payer: Medicare PPO | Admitting: Obstetrics & Gynecology

## 2025-01-02 ENCOUNTER — Encounter: Admitting: Internal Medicine

## 2025-01-23 ENCOUNTER — Encounter: Admitting: Internal Medicine

## 2025-02-13 ENCOUNTER — Ambulatory Visit
